# Patient Record
Sex: Female | Born: 1945
Health system: Southern US, Community
[De-identification: ages and names within clinical notes are randomized; demographics above are authoritative.]

## PROBLEM LIST (undated history)

## (undated) DIAGNOSIS — G8190 Hemiplegia, unspecified affecting unspecified side: Secondary | ICD-10-CM

## (undated) DIAGNOSIS — F32A Depression, unspecified: Secondary | ICD-10-CM

## (undated) DIAGNOSIS — I10 Essential (primary) hypertension: Secondary | ICD-10-CM

## (undated) DIAGNOSIS — F0390 Unspecified dementia without behavioral disturbance: Secondary | ICD-10-CM

## (undated) DIAGNOSIS — M1990 Unspecified osteoarthritis, unspecified site: Secondary | ICD-10-CM

## (undated) DIAGNOSIS — I639 Cerebral infarction, unspecified: Secondary | ICD-10-CM

## (undated) DIAGNOSIS — I729 Aneurysm of unspecified site: Secondary | ICD-10-CM

## (undated) DIAGNOSIS — F329 Major depressive disorder, single episode, unspecified: Secondary | ICD-10-CM

## (undated) HISTORY — PX: TOTAL KNEE ARTHROPLASTY: SHX125

## (undated) SURGERY — Surgical Case
Anesthesia: *Unknown

---

## 1998-06-10 ENCOUNTER — Other Ambulatory Visit: Admission: RE | Admit: 1998-06-10 | Discharge: 1998-06-10 | Payer: Self-pay | Admitting: Obstetrics & Gynecology

## 1998-06-10 ENCOUNTER — Encounter: Admission: RE | Admit: 1998-06-10 | Discharge: 1998-06-10 | Payer: Self-pay | Admitting: Obstetrics & Gynecology

## 1998-09-04 ENCOUNTER — Encounter: Payer: Self-pay | Admitting: Emergency Medicine

## 1998-09-04 ENCOUNTER — Emergency Department (HOSPITAL_COMMUNITY): Admission: EM | Admit: 1998-09-04 | Discharge: 1998-09-04 | Payer: Self-pay | Admitting: Emergency Medicine

## 1999-02-03 ENCOUNTER — Emergency Department (HOSPITAL_COMMUNITY): Admission: EM | Admit: 1999-02-03 | Discharge: 1999-02-03 | Payer: Self-pay | Admitting: Emergency Medicine

## 1999-04-09 ENCOUNTER — Encounter: Admission: RE | Admit: 1999-04-09 | Discharge: 1999-04-09 | Payer: Self-pay | Admitting: Obstetrics

## 1999-04-09 ENCOUNTER — Other Ambulatory Visit: Admission: RE | Admit: 1999-04-09 | Discharge: 1999-04-09 | Payer: Self-pay | Admitting: Obstetrics

## 1999-04-22 ENCOUNTER — Ambulatory Visit (HOSPITAL_COMMUNITY): Admission: RE | Admit: 1999-04-22 | Discharge: 1999-04-22 | Payer: Self-pay | Admitting: Obstetrics

## 1999-04-30 ENCOUNTER — Ambulatory Visit (HOSPITAL_COMMUNITY): Admission: RE | Admit: 1999-04-30 | Discharge: 1999-04-30 | Payer: Self-pay | Admitting: Obstetrics

## 1999-06-25 ENCOUNTER — Emergency Department (HOSPITAL_COMMUNITY): Admission: EM | Admit: 1999-06-25 | Discharge: 1999-06-25 | Payer: Self-pay | Admitting: Emergency Medicine

## 1999-09-05 ENCOUNTER — Emergency Department (HOSPITAL_COMMUNITY): Admission: EM | Admit: 1999-09-05 | Discharge: 1999-09-05 | Payer: Self-pay | Admitting: Emergency Medicine

## 1999-09-26 ENCOUNTER — Emergency Department (HOSPITAL_COMMUNITY): Admission: EM | Admit: 1999-09-26 | Discharge: 1999-09-26 | Payer: Self-pay | Admitting: Emergency Medicine

## 2000-01-15 ENCOUNTER — Encounter: Admission: RE | Admit: 2000-01-15 | Discharge: 2000-01-15 | Payer: Self-pay | Admitting: Obstetrics & Gynecology

## 2000-01-15 ENCOUNTER — Encounter: Admission: RE | Admit: 2000-01-15 | Discharge: 2000-01-15 | Payer: Self-pay | Admitting: Internal Medicine

## 2000-02-01 ENCOUNTER — Ambulatory Visit (HOSPITAL_COMMUNITY): Admission: RE | Admit: 2000-02-01 | Discharge: 2000-02-01 | Payer: Self-pay

## 2000-02-24 ENCOUNTER — Encounter: Admission: RE | Admit: 2000-02-24 | Discharge: 2000-02-24 | Payer: Self-pay | Admitting: Internal Medicine

## 2000-03-07 ENCOUNTER — Emergency Department (HOSPITAL_COMMUNITY): Admission: EM | Admit: 2000-03-07 | Discharge: 2000-03-07 | Payer: Self-pay | Admitting: Emergency Medicine

## 2000-06-09 ENCOUNTER — Encounter: Admission: RE | Admit: 2000-06-09 | Discharge: 2000-06-09 | Payer: Self-pay | Admitting: Obstetrics

## 2000-06-13 ENCOUNTER — Encounter: Admission: RE | Admit: 2000-06-13 | Discharge: 2000-06-13 | Payer: Self-pay | Admitting: Internal Medicine

## 2000-07-26 ENCOUNTER — Encounter: Admission: RE | Admit: 2000-07-26 | Discharge: 2000-07-26 | Payer: Self-pay | Admitting: Obstetrics & Gynecology

## 2000-08-26 ENCOUNTER — Encounter: Admission: RE | Admit: 2000-08-26 | Discharge: 2000-08-26 | Payer: Self-pay | Admitting: Internal Medicine

## 2000-09-13 ENCOUNTER — Emergency Department (HOSPITAL_COMMUNITY): Admission: EM | Admit: 2000-09-13 | Discharge: 2000-09-13 | Payer: Self-pay | Admitting: Emergency Medicine

## 2000-12-29 ENCOUNTER — Inpatient Hospital Stay (HOSPITAL_COMMUNITY): Admission: EM | Admit: 2000-12-29 | Discharge: 2001-02-09 | Payer: Self-pay | Admitting: Emergency Medicine

## 2000-12-29 ENCOUNTER — Encounter: Payer: Self-pay | Admitting: Emergency Medicine

## 2000-12-30 ENCOUNTER — Encounter: Payer: Self-pay | Admitting: Neurological Surgery

## 2000-12-31 ENCOUNTER — Encounter: Payer: Self-pay | Admitting: Neurosurgery

## 2001-01-01 ENCOUNTER — Encounter: Payer: Self-pay | Admitting: Pulmonary Disease

## 2001-01-02 ENCOUNTER — Encounter: Payer: Self-pay | Admitting: Critical Care Medicine

## 2001-01-02 ENCOUNTER — Encounter: Payer: Self-pay | Admitting: Pulmonary Disease

## 2001-01-02 ENCOUNTER — Encounter: Payer: Self-pay | Admitting: Neurological Surgery

## 2001-01-03 ENCOUNTER — Encounter: Payer: Self-pay | Admitting: Neurological Surgery

## 2001-01-04 ENCOUNTER — Encounter: Payer: Self-pay | Admitting: Critical Care Medicine

## 2001-01-05 ENCOUNTER — Encounter: Payer: Self-pay | Admitting: Nephrology

## 2001-01-06 ENCOUNTER — Encounter: Payer: Self-pay | Admitting: Pulmonary Disease

## 2001-01-06 ENCOUNTER — Encounter: Payer: Self-pay | Admitting: Neurosurgery

## 2001-01-07 ENCOUNTER — Encounter: Payer: Self-pay | Admitting: Critical Care Medicine

## 2001-01-08 ENCOUNTER — Encounter: Payer: Self-pay | Admitting: General Surgery

## 2001-01-08 ENCOUNTER — Encounter: Payer: Self-pay | Admitting: Neurological Surgery

## 2001-01-09 ENCOUNTER — Encounter: Payer: Self-pay | Admitting: Critical Care Medicine

## 2001-01-09 ENCOUNTER — Encounter: Payer: Self-pay | Admitting: Neurological Surgery

## 2001-01-10 ENCOUNTER — Encounter: Payer: Self-pay | Admitting: Neurological Surgery

## 2001-01-11 ENCOUNTER — Encounter: Payer: Self-pay | Admitting: Internal Medicine

## 2001-01-11 ENCOUNTER — Encounter: Payer: Self-pay | Admitting: Neurological Surgery

## 2001-01-12 ENCOUNTER — Encounter: Payer: Self-pay | Admitting: Internal Medicine

## 2001-01-13 ENCOUNTER — Encounter: Payer: Self-pay | Admitting: Internal Medicine

## 2001-01-14 ENCOUNTER — Encounter: Payer: Self-pay | Admitting: Internal Medicine

## 2001-01-16 ENCOUNTER — Encounter: Payer: Self-pay | Admitting: Neurological Surgery

## 2001-01-18 ENCOUNTER — Encounter: Payer: Self-pay | Admitting: Internal Medicine

## 2001-01-23 ENCOUNTER — Encounter: Payer: Self-pay | Admitting: Neurological Surgery

## 2001-01-30 ENCOUNTER — Encounter: Payer: Self-pay | Admitting: Neurological Surgery

## 2001-01-31 ENCOUNTER — Encounter: Payer: Self-pay | Admitting: Neurological Surgery

## 2001-02-07 ENCOUNTER — Encounter: Payer: Self-pay | Admitting: Neurological Surgery

## 2001-04-06 ENCOUNTER — Encounter: Admission: RE | Admit: 2001-04-06 | Discharge: 2001-04-06 | Payer: Self-pay | Admitting: Internal Medicine

## 2001-04-27 ENCOUNTER — Encounter: Admission: RE | Admit: 2001-04-27 | Discharge: 2001-04-27 | Payer: Self-pay | Admitting: Internal Medicine

## 2001-05-20 ENCOUNTER — Emergency Department (HOSPITAL_COMMUNITY): Admission: EM | Admit: 2001-05-20 | Discharge: 2001-05-20 | Payer: Self-pay | Admitting: Emergency Medicine

## 2001-05-20 ENCOUNTER — Encounter: Payer: Self-pay | Admitting: Emergency Medicine

## 2001-05-21 ENCOUNTER — Emergency Department (HOSPITAL_COMMUNITY): Admission: EM | Admit: 2001-05-21 | Discharge: 2001-05-21 | Payer: Self-pay

## 2001-09-26 ENCOUNTER — Encounter: Admission: RE | Admit: 2001-09-26 | Discharge: 2001-11-22 | Payer: Self-pay | Admitting: Neurological Surgery

## 2001-10-24 ENCOUNTER — Emergency Department (HOSPITAL_COMMUNITY): Admission: EM | Admit: 2001-10-24 | Discharge: 2001-10-24 | Payer: Self-pay | Admitting: Emergency Medicine

## 2002-01-27 ENCOUNTER — Ambulatory Visit (HOSPITAL_COMMUNITY): Admission: RE | Admit: 2002-01-27 | Discharge: 2002-01-27 | Payer: Self-pay | Admitting: Family Medicine

## 2002-01-27 ENCOUNTER — Encounter: Payer: Self-pay | Admitting: Family Medicine

## 2002-02-05 ENCOUNTER — Encounter: Payer: Self-pay | Admitting: Dentistry

## 2002-02-06 ENCOUNTER — Ambulatory Visit (HOSPITAL_COMMUNITY): Admission: RE | Admit: 2002-02-06 | Discharge: 2002-02-06 | Payer: Self-pay | Admitting: Dentistry

## 2002-11-07 ENCOUNTER — Emergency Department (HOSPITAL_COMMUNITY): Admission: EM | Admit: 2002-11-07 | Discharge: 2002-11-07 | Payer: Self-pay | Admitting: Emergency Medicine

## 2002-11-07 ENCOUNTER — Encounter: Payer: Self-pay | Admitting: Emergency Medicine

## 2002-12-30 ENCOUNTER — Encounter: Payer: Self-pay | Admitting: Emergency Medicine

## 2002-12-30 ENCOUNTER — Emergency Department (HOSPITAL_COMMUNITY): Admission: EM | Admit: 2002-12-30 | Discharge: 2002-12-30 | Payer: Self-pay | Admitting: Emergency Medicine

## 2003-04-15 ENCOUNTER — Other Ambulatory Visit: Admission: RE | Admit: 2003-04-15 | Discharge: 2003-04-15 | Payer: Self-pay | Admitting: Family Medicine

## 2003-05-16 ENCOUNTER — Ambulatory Visit (HOSPITAL_COMMUNITY): Admission: RE | Admit: 2003-05-16 | Discharge: 2003-05-16 | Payer: Self-pay | Admitting: Family Medicine

## 2004-04-28 ENCOUNTER — Other Ambulatory Visit: Admission: RE | Admit: 2004-04-28 | Discharge: 2004-04-28 | Payer: Self-pay | Admitting: Family Medicine

## 2004-05-20 ENCOUNTER — Ambulatory Visit (HOSPITAL_COMMUNITY): Admission: RE | Admit: 2004-05-20 | Discharge: 2004-05-20 | Payer: Self-pay | Admitting: Family Medicine

## 2004-08-26 ENCOUNTER — Ambulatory Visit: Payer: Self-pay | Admitting: Family Medicine

## 2004-09-02 ENCOUNTER — Ambulatory Visit (HOSPITAL_COMMUNITY): Admission: RE | Admit: 2004-09-02 | Discharge: 2004-09-02 | Payer: Self-pay | Admitting: Family Medicine

## 2004-11-26 ENCOUNTER — Ambulatory Visit: Payer: Self-pay | Admitting: Family Medicine

## 2005-02-26 ENCOUNTER — Emergency Department (HOSPITAL_COMMUNITY): Admission: EM | Admit: 2005-02-26 | Discharge: 2005-02-26 | Payer: Self-pay | Admitting: Family Medicine

## 2005-02-26 ENCOUNTER — Ambulatory Visit (HOSPITAL_COMMUNITY): Admission: RE | Admit: 2005-02-26 | Discharge: 2005-02-26 | Payer: Self-pay | Admitting: Family Medicine

## 2005-05-06 ENCOUNTER — Ambulatory Visit: Payer: Self-pay | Admitting: Family Medicine

## 2005-05-24 ENCOUNTER — Ambulatory Visit (HOSPITAL_COMMUNITY): Admission: RE | Admit: 2005-05-24 | Discharge: 2005-05-24 | Payer: Self-pay | Admitting: Family Medicine

## 2005-05-27 ENCOUNTER — Ambulatory Visit: Payer: Self-pay | Admitting: Family Medicine

## 2005-08-12 ENCOUNTER — Emergency Department (HOSPITAL_COMMUNITY): Admission: EM | Admit: 2005-08-12 | Discharge: 2005-08-12 | Payer: Self-pay | Admitting: Emergency Medicine

## 2006-05-26 ENCOUNTER — Ambulatory Visit (HOSPITAL_COMMUNITY): Admission: RE | Admit: 2006-05-26 | Discharge: 2006-05-26 | Payer: Self-pay | Admitting: Family Medicine

## 2008-06-03 ENCOUNTER — Ambulatory Visit (HOSPITAL_COMMUNITY): Admission: RE | Admit: 2008-06-03 | Discharge: 2008-06-03 | Payer: Self-pay | Admitting: *Deleted

## 2009-06-03 ENCOUNTER — Other Ambulatory Visit: Admission: RE | Admit: 2009-06-03 | Discharge: 2009-06-03 | Payer: Self-pay | Admitting: Family Medicine

## 2009-06-04 ENCOUNTER — Ambulatory Visit (HOSPITAL_COMMUNITY): Admission: RE | Admit: 2009-06-04 | Discharge: 2009-06-04 | Payer: Self-pay | Admitting: Family Medicine

## 2009-11-08 DIAGNOSIS — I639 Cerebral infarction, unspecified: Secondary | ICD-10-CM

## 2009-11-08 HISTORY — PX: BRAIN SURGERY: SHX531

## 2009-11-08 HISTORY — DX: Cerebral infarction, unspecified: I63.9

## 2010-04-30 ENCOUNTER — Inpatient Hospital Stay (HOSPITAL_COMMUNITY): Admission: RE | Admit: 2010-04-30 | Discharge: 2010-05-04 | Payer: Self-pay | Admitting: Orthopedic Surgery

## 2010-06-06 ENCOUNTER — Emergency Department (HOSPITAL_COMMUNITY): Admission: EM | Admit: 2010-06-06 | Discharge: 2010-06-06 | Payer: Self-pay | Admitting: Emergency Medicine

## 2010-10-21 ENCOUNTER — Ambulatory Visit (HOSPITAL_COMMUNITY)
Admission: RE | Admit: 2010-10-21 | Discharge: 2010-10-21 | Payer: Self-pay | Source: Home / Self Care | Attending: Family Medicine | Admitting: Family Medicine

## 2010-11-28 ENCOUNTER — Encounter: Payer: Self-pay | Admitting: Family Medicine

## 2010-11-29 ENCOUNTER — Encounter: Payer: Self-pay | Admitting: Family Medicine

## 2011-01-23 LAB — URINE MICROSCOPIC-ADD ON

## 2011-01-23 LAB — URINALYSIS, ROUTINE W REFLEX MICROSCOPIC
Bilirubin Urine: NEGATIVE
Glucose, UA: NEGATIVE mg/dL
Ketones, ur: NEGATIVE mg/dL
Nitrite: NEGATIVE
Protein, ur: 100 mg/dL — AB
Specific Gravity, Urine: 1.015 (ref 1.005–1.030)
Urobilinogen, UA: 0.2 mg/dL (ref 0.0–1.0)
pH: 6.5 (ref 5.0–8.0)

## 2011-01-23 LAB — PROTIME-INR
INR: 1.65 — ABNORMAL HIGH (ref 0.00–1.49)
Prothrombin Time: 19.4 seconds — ABNORMAL HIGH (ref 11.6–15.2)

## 2011-01-23 LAB — CBC
HCT: 31.6 % — ABNORMAL LOW (ref 36.0–46.0)
Hemoglobin: 10.8 g/dL — ABNORMAL LOW (ref 12.0–15.0)
MCH: 31.7 pg (ref 26.0–34.0)
MCHC: 34 g/dL (ref 30.0–36.0)
MCV: 93.3 fL (ref 78.0–100.0)
Platelets: 277 10*3/uL (ref 150–400)
RBC: 3.39 MIL/uL — ABNORMAL LOW (ref 3.87–5.11)
RDW: 15.4 % (ref 11.5–15.5)
WBC: 7.8 10*3/uL (ref 4.0–10.5)

## 2011-01-23 LAB — URINE CULTURE: Colony Count: 9000

## 2011-01-24 LAB — PROTIME-INR
INR: 1.07 (ref 0.00–1.49)
INR: 2.28 — ABNORMAL HIGH (ref 0.00–1.49)
Prothrombin Time: 13.8 seconds (ref 11.6–15.2)
Prothrombin Time: 15.3 seconds — ABNORMAL HIGH (ref 11.6–15.2)
Prothrombin Time: 24.9 seconds — ABNORMAL HIGH (ref 11.6–15.2)

## 2011-01-24 LAB — COMPREHENSIVE METABOLIC PANEL
ALT: 11 U/L (ref 0–35)
AST: 16 U/L (ref 0–37)
Albumin: 3.9 g/dL (ref 3.5–5.2)
Alkaline Phosphatase: 81 U/L (ref 39–117)
BUN: 11 mg/dL (ref 6–23)
CO2: 31 mEq/L (ref 19–32)
Calcium: 10.4 mg/dL (ref 8.4–10.5)
Chloride: 111 mEq/L (ref 96–112)
Creatinine, Ser: 0.96 mg/dL (ref 0.4–1.2)
GFR calc Af Amer: >60 mL/min (ref >60)
GFR calc non Af Amer: 59 mL/min — ABNORMAL LOW (ref >60)
Glucose, Bld: 81 mg/dL (ref 70–99)
Potassium: 6.2 mEq/L — ABNORMAL HIGH (ref 3.5–5.1)
Sodium: 146 mEq/L — ABNORMAL HIGH (ref 135–145)
Total Bilirubin: 0.3 mg/dL (ref 0.3–1.2)
Total Protein: 7.1 g/dL (ref 6.0–8.3)

## 2011-01-24 LAB — BASIC METABOLIC PANEL
BUN: 5 mg/dL — ABNORMAL LOW (ref 6–23)
BUN: 5 mg/dL — ABNORMAL LOW (ref 6–23)
BUN: 7 mg/dL (ref 6–23)
CO2: 28 mEq/L (ref 19–32)
CO2: 29 mEq/L (ref 19–32)
Calcium: 8.8 mg/dL (ref 8.4–10.5)
Calcium: 9.5 mg/dL (ref 8.4–10.5)
Chloride: 101 mEq/L (ref 96–112)
Chloride: 109 mEq/L (ref 96–112)
Creatinine, Ser: 0.76 mg/dL (ref 0.4–1.2)
Creatinine, Ser: 0.79 mg/dL (ref 0.4–1.2)
Creatinine, Ser: 0.96 mg/dL (ref 0.4–1.2)
GFR calc Af Amer: >60 mL/min (ref >60)
GFR calc Af Amer: >60 mL/min (ref >60)
GFR calc non Af Amer: 59 mL/min — ABNORMAL LOW (ref >60)
GFR calc non Af Amer: >60 mL/min (ref >60)
GFR calc non Af Amer: >60 mL/min (ref >60)
Glucose, Bld: 130 mg/dL — ABNORMAL HIGH (ref 70–99)
Glucose, Bld: 138 mg/dL — ABNORMAL HIGH (ref 70–99)
Glucose, Bld: 91 mg/dL (ref 70–99)
Potassium: 3 mEq/L — ABNORMAL LOW (ref 3.5–5.1)
Potassium: 3.9 mEq/L (ref 3.5–5.1)
Potassium: 4.4 mEq/L (ref 3.5–5.1)
Sodium: 140 mEq/L (ref 135–145)
Sodium: 142 mEq/L (ref 135–145)

## 2011-01-24 LAB — CBC
HCT: 31.4 % — ABNORMAL LOW (ref 36.0–46.0)
HCT: 37.9 % (ref 36.0–46.0)
Hemoglobin: 10.9 g/dL — ABNORMAL LOW (ref 12.0–15.0)
Hemoglobin: 12.8 g/dL (ref 12.0–15.0)
MCH: 32.2 pg (ref 26.0–34.0)
MCHC: 33.8 g/dL (ref 30.0–36.0)
MCHC: 34.3 g/dL (ref 30.0–36.0)
MCHC: 34.9 g/dL (ref 30.0–36.0)
MCV: 94.1 fL (ref 78.0–100.0)
MCV: 94.8 fL (ref 78.0–100.0)
Platelets: 208 10*3/uL (ref 150–400)
Platelets: 303 10*3/uL (ref 150–400)
RBC: 4 MIL/uL (ref 3.87–5.11)
RDW: 15.1 % (ref 11.5–15.5)
RDW: 15.3 % (ref 11.5–15.5)
RDW: 15.9 % — ABNORMAL HIGH (ref 11.5–15.5)
WBC: 8.2 10*3/uL (ref 4.0–10.5)

## 2011-01-24 LAB — TYPE AND SCREEN
ABO/RH(D): O POS
Antibody Screen: NEGATIVE

## 2011-01-24 LAB — SURGICAL PCR SCREEN
MRSA, PCR: NEGATIVE
Staphylococcus aureus: NEGATIVE

## 2011-01-24 LAB — APTT: aPTT: 35 seconds (ref 24–37)

## 2011-01-24 LAB — ABO/RH: ABO/RH(D): O POS

## 2011-03-26 NOTE — Op Note (Signed)
[place]. [facility]  Patient:    YURIDIANA, FORMANEK Visit Number: [id number] MRN: [id number]          Service Type: EMS Location: [facility] [place] Attending Physician:  Ilene Qua Dictated by:   Cherly Anderson, D.D.S. Proc. Date: 02/06/02 Admit Date:  10/24/2001 Discharge Date: 10/24/2001                             Operative Report  PREOPERATIVE DIAGNOSIS:  All remaining teeth decayed/necrotic/nonrestorable.  POSTOPERATIVE DIAGNOSIS:  All remaining teeth decayed/necrotic/nonrestorable.  PROCEDURE:  Surgical extraction of teeth #22,23,24,25,27,28, root recovery of teeth #6,7,8,10,20,21,26,29, alveoloplasty with extractions right maxilla, right mandible, left mandible.  SURGEON:  Cherly Anderson, D.D.S.  ANESTHESIA:  General anesthesia.  INDICATIONS FOR PROCEDURE:  Ms. Hofland was evaluated in our office upon referral from her general dentist for removal of remaining teeth.  Clinical and radiographic examination shows that all remaining teeth are either decayed, necrotic, or nonrestorable.  Concerning the patients difficulty in cooperating I did not feel it could be performed under safe conditions in the office.  Therefore, she was taken to the main operating room at [facility] for removal of all remaining teeth under general anesthesia in the main OR.  DESCRIPTION OF PROCEDURE:  The patient was brought to the operating room and placed in the supine position.  Once the general anesthesia was induced via nasotracheal intubation, the patient was prepped and draped for maxillofacial procedure of this type.  Initially, local anesthesia was injected in all four quadrants of the mouth totalling 6 cc of 0.5% Marcaine without epinephrine. Next, the #15 blade was utilized to make an incision through the gingiva around all remaining teeth and the maxilla and mandible.  The mucoperiosteal flap was reflected around all remaining teeth.  All remaining teeth  were loosened and removed without complications.  The following teeth were removed surgically: Teeth #22,23,24,25,27, and 28.  The following teeth had root recovery: Teeth #6,7,8,10,20,21,26, and 29.  Next, an alveoloplasty bur was utilized to contour the alveolus where the teeth were extracted.  This was performed in the right maxilla, right mandible, and left mandible.  Once the bone was adequately contoured, the ridges were palpated and found to have no sharp, bony projections or significant undercuts which would make it difficult for prosthesis fabrication.  It should be noted that during the alveoloplasty procedure, copious amounts of normal saline irrigation was utilized.  All surgical sites were irrigated thoroughly with normal saline.  The surgical sites were closed utilizing 3-0 Chromic suture in a running fashion.  The patient was allowed to awaken in the operating room and extubated without complications.  She was then transferred to the PACU in stable and satisfactory condition. Dictated by:   Cherly Anderson, D.D.S. Attending Physician:  Ilene Qua DD:  02/06/02 TD:  02/06/02 Job: [id number]

## 2011-03-26 NOTE — H&P (Signed)
[place]. [facility]  Patient:    Jamie Haas, Jamie Haas                      MRN: [id number] Adm. Date:  [id number] Attending:  Jonne Ply                         History and Physical  ADMITTING DIAGNOSIS:  Subarachnoid hemorrhage.  HISTORY OF PRESENT ILLNESS:  Patient is a 65 year old left-handed black female who has a history of smoking two to three packs of cigarettes for many years, a history of some alcohol abuse with excessive beer drinking daily, and some chronic depression.  She developed the sudden and severe onset of headache at about 4:30 p.m.  She was brought by her daughter to the [facility] emergency room where a CT scan of the brain shows a subarachnoid hemorrhage.  She is now to be admitted to undergo further workup.  She is being arranged to undergo emergent angiography at the time of this writing.  PAST MEDICAL HISTORY:  Significant for depression and alcohol abuse.  She has had no previous surgery.  MEDICATIONS:  She uses no medication on a chronic basis.  HABITS:  Her daughter does note that she does smoke marijuana occasionally.  ALLERGIES:  No known drug allergies.  FAMILY HISTORY:  Unknown in that there is no significant history that is know to the patient and her family.  SOCIAL HISTORY:  Reveals that she is unemployed.  She lives with her daughter. Has a history of significant alcohol usage.  REVIEW OF SYSTEMS:  Negative for a 14 point systems review other than complaints of chronic shortness of breath and recurrent bronchitis.  PHYSICAL EXAMINATION:  VITAL SIGNS:  Blood pressure 140/86, heart rate 72, respirations 14, temperature 97.3.  GENERAL:  She alerts to voice though she is extremely lethargic.  She complains of headache and vomits.  She moves all four extremities quite well. There is no evidence of a plegia.  HEENT:  Her pupils are 4 mm, briskly reactive to light and accommodation. Extraocular muscles are  full.  Face is symmetric to grimace.  Tongue and uvula are in the midline.  Sclerae and conjunctivae were clear.  Fundi reveal the disks to be flat but no evidence of venous pulsation.  There is no evidence of subhyaloid hemorrhage.  NECK:  Reveals no masses and no bruits are heard.  LUNGS:  Clear to auscultation.  HEART:  Regular rate and rhythm.  ABDOMEN:  Soft, bowel sounds present.  No masses are noted.  EXTREMITIES:  Reveal no cyanosis, clubbing, or edema.  IMPRESSION:  Patient has evidence of a subarachnoid hemorrhage.  She has a grade 3 Hunt and Hess.  She is to be admitted to undergo angiography, possible coiling if felt to be appropriate by Dr. Corliss Skains.  Otherwise, surgical intervention will be planned as needed. DD:  12/29/00 TD:  12/30/00 Job: [id number]

## 2011-03-26 NOTE — Op Note (Signed)
[place]. [facility]  Patient:    Jamie Haas, Jamie Haas                      MRN: [id number] Proc. Date: 12/29/00 Adm. Date:  [id number] Attending:  Jonne Ply                           Operative Report  PREOPERATIVE DIAGNOSIS:  Right posterior communicating artery aneurysm, subarachnoid hemorrhage.  POSTOPERATIVE DIAGNOSIS:  Right posterior communicating artery aneurysm, subarachnoid hemorrhage.  PROCEDURE:  Insertion of right intraventricular drainage catheter.  SURGEON:  Stefani Dama, M.D.  ANESTHESIA:  Local anesthetic - 6 cc with 1 cc of Versed intravenously. Lidocaine 2% was used subcutaneously without epinephrine.  INDICATIONS:  The patient had a subarachnoid hemorrhage yesterday.  She was found to have significant hydrocephalus and the family was advised regarding placement of an IVC.  DESCRIPTION OF PROCEDURE:  The patient had the right frontal scalp prepped with Betadine scrub and solution and then a linear incision was made after infiltrating 5 cc of 2% lidocaine solution in the right frontal area.  A bur hole was created, dura was identified and opened in a cruciate fashion with a 15-blade.  A ventriculostomy catheter was inserted to 5 cm depth and bloody spinal fluid was obtained at a pressure greater than 30 cmH20.  The catheter was tunneled subcutaneously.  The wound was closed with 3-0 nylon and a 3-0 silk was used to secure the catheter.  System was connected to drain open at 10 cmH20. DD:  12/30/00 TD:  12/30/00 Job: [id number]

## 2011-03-26 NOTE — Op Note (Signed)
[place]. [facility]  Patient:    Jamie Haas, Jamie Haas                      MRN: [id number] Proc. Date: 01/12/01 Adm. Date:  [id number] Attending:  Jonne Ply                           Operative Report  PREOPERATIVE DIAGNOSIS:  Chronic ventilatory dependency.  POSTOPERATIVE DIAGNOSIS:  Chronic ventilatory dependency.  OPERATION PERFORMED:  Tracheotomy.  SURGEON:  Lucky Cowboy, M.D.  ANESTHESIA:  General endotracheal.  ESTIMATED BLOOD LOSS:  Minimal.  COMPLICATIONS:  None.  INDICATIONS FOR PROCEDURE:  The patient is a 65 year old female who was admitted December 29, 2000 for severe subarachnoid hemorrhage.  Since that time, she has suffered from adult respiratory distress syndrome.  She has required constant ventilatory support.  For this reason, tracheotomy was plced.  FINDINGS:  The patient was noted to have normal tracheal anatomy.  Tracheotomy tube was placed between rings 2 and 3.  One knot was placed around the stay suture around ring #3.  A #6 cuffed shiley was placed.  DESCRIPTION OF PROCEDURE:  The patient was taken to the operating room and placed on the table in a supine position.  She was already intubated and was placed under general anesthesia.  The neck was then gently extended with a shoulder roll.  It was then prepped with Betadine and draped in the usual sterile fashion.  A transverse 2 cm incision was made in the lower neck overlying the trachea.  It was made with a #15 blade and the subcutaneous tissues divided using Bovie cautery.  The strap muscles were divided in the median raphe.  The thyroid was reflected inferiorly.  The thyroid isthmus was not divided.  A 15 blade was used to make an incision between rings 2 and 3. Stay suture using 0 silk was placed around the third tracheal ring and one knot placed.  A #6 Shiley tube was then placed intratracheally with CO2 returning, good breath sounds bilaterally.  Cuff was  inflated.  The flanges of the tracheotomy tube were then secured to the skin in simple interrupted fashion using 0 silk.  The Velcro tie was then applied.  The patient was then returned to the intensive care unit in stable condition.  There were no complications. DD:  01/12/01 TD:  01/13/01 Job: [id number]

## 2011-03-26 NOTE — Consult Note (Signed)
[place]. [facility]  Patient:    Jamie Haas, Jamie Haas                      MRN: [id number] Proc. Date: 12/29/00 Adm. Date:  [id number] Attending:  Jonne Ply                          Consultation Report  REQUESTING PHYSICIAN:  Dr. Trudi Ida. Denton Lank, M.D.  REASON FOR CONSULTATION:  Subarachnoid hemorrhage.  HISTORY OF PRESENT ILLNESS:  The patient is a 65 year old individual who sustained a subarachnoid hemorrhage today.  She was brought to the emergency room complaining of significant headache, neck stiffness, nausea and vomiting. CT scan revealed a subarachnoid hemorrhage and clinically she is a grade 3. She will be admitted to undergo emergent angiography.  For other details of the past medical history, social history, systems review, current medication usage and other details please refer to the history and physical examination dictated under separate cover. DD:  12/29/00 TD:  12/31/00 Job: [id number]

## 2011-03-26 NOTE — Discharge Summary (Signed)
[place]. [facility]  Patient:    Jamie Haas, Jamie Haas                      MRN: [id number] Adm. Date:  [id number] Attending:  Jonne Ply                           Discharge Summary  ADMISSION DIAGNOSES: 1. Subarachnoid hemorrhage secondary to right posterior communicating artery    aneurysm. 2. Acute hydrocephalus secondary to subarachnoid hemorrhage.  DISCHARGE DIAGNOSES: 1. Subarachnoid hemorrhage secondary to ruptured aneurysm. 2. Hydrocephalus, now resolved. 3. Depression. 4. History of substance abuse. 5. Left hemiplegia.  PROCEDURES: 1. Endovascular coiling with Guglielmi coils of right posterior communicating    artery aneurysm, right frontal intraventricular catheter placement. 2. Tracheostomy done January 12, 2001. 3. Decannulated March 27.  ADMISSION HISTORY:  The patient is a 65 year old woman who was admitted emergently secondary to a subarachnoid hemorrhage on December 29, 2000.  She had evidence of acute hydrocephalus.  She had not had any previous surgery. she was extremely lethargic initially and a grade 3 Hunt and Hess scale patient.  She underwent coiling on December 30, 2000, and had placement of a right frontal intraventricular catheter.  She was taken to the intensive care unit and did quite poorly for a number of days.  She developed vasospasm and cerebral infarcts.  She was dependent upon a ventilatory and therefore underwent a tracheostomy.  She had multiple bouts of pneumonia and fevers throughout her hospitalization.  She eventually rallied and made improvements. The ventricular catheter was able to be removed, the tracheostomy was able to be removed.  She regained good cognitive function, but remained plegic on her left side secondary to the vasospasm and infarcts.  At the time of discharge, she is alert, follows commands, oriented x 4, answering all questions, and is cooperative.  She is tolerating a soft diet, which at  our institution is considered the dysphagia I diet which consists of pudding-thick foods.  She also has been receiving physical therapy, occupational therapy, and speech therapy during her hospitalization.  Tracheostomy incision is clean and dry and without signs of infection.  She was also treated for pansinusitis during her hospitalization.  CURRENT MEDICATIONS:  Septra, Prozac, morphine for pain, Tylenol, Imodium, albuterol for breathing treatments, and Ensure pudding.  DISPOSITION:  The patient will be discharged to the nursing home.  CONDITION AT DISCHARGE:  Her status currently is improved.  DISCHARGE FOLLOWUP:  She will have a follow-up with Dr. Danielle Dess approximately one month after discharge from the nursing home.  She is to receive physical therapy, occupational therapy, and continued speech therapy. DD:  02/07/01 TD:  02/07/01 Job: [id number]

## 2011-09-15 ENCOUNTER — Other Ambulatory Visit (HOSPITAL_COMMUNITY): Payer: Self-pay | Admitting: Family Medicine

## 2011-09-15 DIAGNOSIS — Z1231 Encounter for screening mammogram for malignant neoplasm of breast: Secondary | ICD-10-CM

## 2011-10-25 ENCOUNTER — Ambulatory Visit (HOSPITAL_COMMUNITY)
Admission: RE | Admit: 2011-10-25 | Discharge: 2011-10-25 | Disposition: A | Discharge status: Home / Self Care | Payer: PRIVATE HEALTH INSURANCE | Source: Ambulatory Visit | Attending: Family Medicine | Admitting: Family Medicine

## 2011-10-25 ENCOUNTER — Ambulatory Visit (HOSPITAL_COMMUNITY): Payer: Self-pay

## 2011-10-25 DIAGNOSIS — Z1231 Encounter for screening mammogram for malignant neoplasm of breast: Secondary | ICD-10-CM

## 2011-11-22 ENCOUNTER — Emergency Department (HOSPITAL_COMMUNITY)
Admission: EM | Admit: 2011-11-22 | Discharge: 2011-11-22 | Disposition: A | Discharge status: Home / Self Care | Payer: PRIVATE HEALTH INSURANCE | Attending: Emergency Medicine | Admitting: Emergency Medicine

## 2011-11-22 ENCOUNTER — Encounter (HOSPITAL_COMMUNITY): Payer: Self-pay | Admitting: Emergency Medicine

## 2011-11-22 DIAGNOSIS — J3489 Other specified disorders of nose and nasal sinuses: Secondary | ICD-10-CM | POA: Insufficient documentation

## 2011-11-22 DIAGNOSIS — R51 Headache: Secondary | ICD-10-CM | POA: Insufficient documentation

## 2011-11-22 DIAGNOSIS — J019 Acute sinusitis, unspecified: Secondary | ICD-10-CM

## 2011-11-22 DIAGNOSIS — I10 Essential (primary) hypertension: Secondary | ICD-10-CM | POA: Insufficient documentation

## 2011-11-22 DIAGNOSIS — H9209 Otalgia, unspecified ear: Secondary | ICD-10-CM | POA: Insufficient documentation

## 2011-11-22 DIAGNOSIS — F17200 Nicotine dependence, unspecified, uncomplicated: Secondary | ICD-10-CM | POA: Insufficient documentation

## 2011-11-22 HISTORY — DX: Aneurysm of unspecified site: I72.9

## 2011-11-22 HISTORY — DX: Essential (primary) hypertension: I10

## 2011-11-22 MED ORDER — ANTIPYRINE-BENZOCAINE 5.4-1.4 % OT SOLN
3.0000 [drp] | Freq: Once | OTIC | Status: AC
  Administered 2011-11-22: 3 [drp] via OTIC
  Filled 2011-11-22: qty 10

## 2011-11-22 MED ORDER — IBUPROFEN 200 MG PO TABS
400.0000 mg | ORAL_TABLET | Freq: Once | ORAL | Status: AC
  Administered 2011-11-22: 400 mg via ORAL
  Filled 2011-11-22: qty 2

## 2011-11-22 MED ORDER — MOXIFLOXACIN HCL 400 MG PO TABS: 400.0000 mg | ORAL_TABLET | Freq: Every day | ORAL | Status: AC

## 2011-11-22 MED ORDER — MOMETASONE FUROATE 50 MCG/ACT NA SUSP: 2.0000 | Freq: Every day | NASAL | Status: DC

## 2011-11-22 NOTE — ED Provider Notes (Signed)
History     CSN: [id number]  Arrival date & time 11/22/11  1500   First MD Initiated Contact with Patient 11/22/11 1544      Chief Complaint  Patient presents with  . Nasal Congestion    (Consider location/radiation/quality/duration/timing/severity/associated sxs/prior treatment) HPI Comments: Patient reports one day of nasal congestion and left-sided facial pain and left ear pain. She also has a headache across her forehead. She denies fever or chills no sore throat or cough. Family was slightly concerned because she had a history of a left-sided aneurysm. I reviewed the patient's old records and it turns out that the patient actually had an aneurysm on the right side. She also has a long history of repeated sinusitis. She reports that she is congested in both sinuses. She's not taking any medications prior to arrival. She also smokes cigarettes daily. Have encouraged her to stop smoking. Her primary care physician is Dr. Renaye Rakers.  The history is provided by the patient and a relative.    Past Medical History  Diagnosis Date  . Hypertension   . Aneurysm     Past Surgical History  Procedure Date  . Total knee arthroplasty     No family history on file.  History  Substance Use Topics  . Smoking status: Current Everyday Smoker  . Smokeless tobacco: Not on file  . Alcohol Use: No    OB History    Grav Para Term Preterm Abortions TAB SAB Ect Mult Living                  Review of Systems  Constitutional: Negative for fever and chills.  HENT: Positive for ear pain, congestion, rhinorrhea, sneezing, postnasal drip and sinus pressure. Negative for nosebleeds, sore throat, trouble swallowing, voice change, tinnitus and ear discharge.   Eyes: Negative for discharge and redness.  Respiratory: Negative for cough.   Cardiovascular: Negative for chest pain.  Gastrointestinal: Negative for nausea, vomiting and abdominal pain.  Neurological: Positive for headaches. Negative  for dizziness, weakness, light-headedness and numbness.  All other systems reviewed and are negative.    Allergies  Review of patient's allergies indicates no known allergies.  Home Medications   Current Outpatient Rx  Name Route Sig Dispense Refill  . PSEUDOEPHEDRINE-ACETAMINOPHEN 30-500 MG PO TABS Oral Take 2 tablets by mouth every 4 (four) hours as needed. For sinus pressure    . MOMETASONE FUROATE 50 MCG/ACT NA SUSP Nasal Place 2 sprays into the nose daily. 17 g 12  . MOXIFLOXACIN HCL 400 MG PO TABS Oral Take 1 tablet (400 mg total) by mouth daily. 10 tablet 0    BP 138/79  Pulse 81  Temp(Src) 98.2 F (36.8 C) (Oral)  Resp 18  SpO2 98%  Physical Exam  Nursing note and vitals reviewed. Constitutional: She appears well-developed and well-nourished.  HENT:  Head: Normocephalic and atraumatic.  Right Ear: Hearing and tympanic membrane normal.  Left Ear: Hearing and tympanic membrane normal.  Nose: Mucosal edema, rhinorrhea and sinus tenderness present. No nasal septal hematoma. No epistaxis. Right sinus exhibits frontal sinus tenderness. Right sinus exhibits no maxillary sinus tenderness. Left sinus exhibits frontal sinus tenderness. Left sinus exhibits no maxillary sinus tenderness.  Eyes: Pupils are equal, round, and reactive to light. Right conjunctiva is injected. Left conjunctiva is injected.  Pulmonary/Chest: Effort normal. She has no decreased breath sounds. She has no wheezes. She has no rhonchi.  Neurological: She has normal strength. No sensory deficit.    ED Course  Procedures (including critical care time)  Labs Reviewed - No data to display No results found.   1. Sinusitis acute       MDM   I reviewed the patient's prior records. The patient's former aneurysm was more than 10 years ago and was located on the right side. No clinical suspicion for brain aneurysm. The patient clearly has nasal congestion and likely has sinusitis. Given her prior record  even though she has no fever here I'm okay giving her oral antibiotics for sinusitis as well as nasal decongestant with nasal steroids. Have encouraged the patient to stop smoking. I also told the patient and family to go ahead and make a followup appointment with her regular doctor for next week so that they know that she is improving by that time. Her room air saturations are 98% which is normal.        Gavin Pound. Oletta Lamas, MD 11/22/11 [phone]

## 2011-11-22 NOTE — Discharge Instructions (Signed)
 Sinusitis Sinuses are air pockets within the bones of your face. The growth of bacteria within a sinus leads to infection. The infection prevents the sinuses from draining. This infection is called sinusitis. SYMPTOMS  There will be different areas of pain depending on which sinuses have become infected.  The maxillary sinuses often produce pain beneath the eyes.   Frontal sinusitis may cause pain in the middle of the forehead and above the eyes.  Other problems (symptoms) include:  Toothaches.   Colored, pus-like (purulent) drainage from the nose.   Swelling, warmth, and tenderness over the sinus areas may be signs of infection.  TREATMENT  Sinusitis is most often determined by an exam.X-rays may be taken. If x-rays have been taken, make sure you obtain your results or find out how you are to obtain them. Your caregiver may give you medications (antibiotics). These are medications that will help kill the bacteria causing the infection. You may also be given a medication (decongestant) that helps to reduce sinus swelling.  HOME CARE INSTRUCTIONS   Only take over-the-counter or prescription medicines for pain, discomfort, or fever as directed by your caregiver.   Drink extra fluids. Fluids help thin the mucus so your sinuses can drain more easily.   Applying either moist heat or ice packs to the sinus areas may help relieve discomfort.   Use saline nasal sprays to help moisten your sinuses. The sprays can be found at your local drugstore.  SEEK IMMEDIATE MEDICAL CARE IF:  You have a fever.   You have increasing pain, severe headaches, or toothache.   You have nausea, vomiting, or drowsiness.   You develop unusual swelling around the face or trouble seeing.  MAKE SURE YOU:   Understand these instructions.   Will watch your condition.   Will get help right away if you are not doing well or get worse.  Document Released: 10/25/2005 Document Revised: 07/07/2011 Document Reviewed:  05/24/2007 [facility] Patient Information 2012 [place], [place].    Be sure to call and make an appointment with Dr. Benjamine for next week to make sure that your symptoms are improving.

## 2011-11-22 NOTE — ED Notes (Signed)
PT. REPORTS REPORTS NASAL CONGESTION / RUNNY NOSE ONSET THIS MORNING , DENIES COUGH OR SOB , NO FEVER OR CHILLS.

## 2012-01-12 ENCOUNTER — Other Ambulatory Visit: Payer: Self-pay

## 2012-02-18 ENCOUNTER — Other Ambulatory Visit: Payer: Self-pay | Admitting: Obstetrics

## 2012-02-24 ENCOUNTER — Encounter (HOSPITAL_COMMUNITY): Payer: Self-pay | Admitting: Pharmacist

## 2012-03-02 ENCOUNTER — Encounter (HOSPITAL_COMMUNITY): Payer: Self-pay

## 2012-03-02 ENCOUNTER — Other Ambulatory Visit: Payer: Self-pay

## 2012-03-02 ENCOUNTER — Encounter (HOSPITAL_COMMUNITY)
Admission: RE | Admit: 2012-03-02 | Discharge: 2012-03-02 | Disposition: A | Discharge status: Home / Self Care | Payer: Medicare Other | Source: Ambulatory Visit | Attending: Obstetrics | Admitting: Obstetrics

## 2012-03-02 DIAGNOSIS — Z01818 Encounter for other preprocedural examination: Secondary | ICD-10-CM | POA: Insufficient documentation

## 2012-03-02 DIAGNOSIS — Z01812 Encounter for preprocedural laboratory examination: Secondary | ICD-10-CM | POA: Insufficient documentation

## 2012-03-02 HISTORY — DX: Cerebral infarction, unspecified: I63.9

## 2012-03-02 LAB — BASIC METABOLIC PANEL
CO2: 30 mEq/L (ref 19–32)
Chloride: 106 mEq/L (ref 96–112)
Glucose, Bld: 85 mg/dL (ref 70–99)
Potassium: 5.4 mEq/L — ABNORMAL HIGH (ref 3.5–5.1)
Sodium: 143 mEq/L (ref 135–145)

## 2012-03-02 LAB — CBC
Hemoglobin: 12.3 g/dL (ref 12.0–15.0)
RBC: 4.07 MIL/uL (ref 3.87–5.11)

## 2012-03-02 NOTE — Patient Instructions (Signed)
YOUR PROCEDURE IS SCHEDULED ON: 03/10/12  ENTER THROUGH THE MAIN ENTRANCE OF [facility] AT: 1130am USE DESK PHONE AND DIAL [phone] TO INFORM us OF YOUR ARRIVAL  CALL [phone] IF YOU HAVE ANY QUESTIONS OR PROBLEMS PRIOR TO YOUR ARRIVAL.  REMEMBER: DO NOT EAT AFTER MIDNIGHT : Thursday  SPECIAL INSTRUCTIONS:clear liquids ok until 9am on Friday   YOU MAY BRUSH YOUR TEETH THE MORNING OF SURGERY   TAKE THESE MEDICINES THE DAY OF SURGERY WITH SIP OF WATER:blood pressure pill   DO NOT WEAR JEWELRY, EYE MAKEUP, LIPSTICK OR DARK FINGERNAIL POLISH DO NOT WEAR LOTIONS  DO NOT SHAVE FOR 48 HOURS PRIOR TO SURGERY  YOU WILL NOT BE ALLOWED TO DRIVE YOURSELF HOME.  NAME OF DRIVER:Tabitha

## 2012-03-17 ENCOUNTER — Ambulatory Visit (HOSPITAL_COMMUNITY)
Admission: RE | Admit: 2012-03-17 | Discharge: 2012-03-17 | Disposition: A | Discharge status: Home / Self Care | Payer: Medicare Other | Source: Ambulatory Visit | Attending: Obstetrics | Admitting: Obstetrics

## 2012-03-17 ENCOUNTER — Encounter (HOSPITAL_COMMUNITY): Payer: Self-pay

## 2012-03-17 ENCOUNTER — Encounter (HOSPITAL_COMMUNITY): Payer: Self-pay | Admitting: Obstetrics

## 2012-03-17 ENCOUNTER — Ambulatory Visit (HOSPITAL_COMMUNITY): Payer: Medicare Other

## 2012-03-17 ENCOUNTER — Encounter (HOSPITAL_COMMUNITY)
Admission: RE | Disposition: A | Discharge status: Home / Self Care | Payer: Self-pay | Source: Ambulatory Visit | Attending: Obstetrics

## 2012-03-17 DIAGNOSIS — Z01818 Encounter for other preprocedural examination: Secondary | ICD-10-CM | POA: Insufficient documentation

## 2012-03-17 DIAGNOSIS — A630 Anogenital (venereal) warts: Secondary | ICD-10-CM | POA: Insufficient documentation

## 2012-03-17 DIAGNOSIS — Z01812 Encounter for preprocedural laboratory examination: Secondary | ICD-10-CM | POA: Insufficient documentation

## 2012-03-17 HISTORY — PX: VULVAR LESION REMOVAL: SHX5391

## 2012-03-17 SURGERY — VULVAR LESION
Anesthesia: General | Site: Vulva | Wound class: Clean Contaminated

## 2012-03-17 MED ORDER — NONFORMULARY OR COMPOUNDED ITEM: 1.0000 | Status: AC | PRN

## 2012-03-17 MED ORDER — LIDOCAINE HCL (CARDIAC) 20 MG/ML IV SOLN
INTRAVENOUS | Status: AC
  Filled 2012-03-17: qty 5

## 2012-03-17 MED ORDER — FENTANYL CITRATE 0.05 MG/ML IJ SOLN
INTRAMUSCULAR | Status: DC | PRN
  Administered 2012-03-17 (×3): 50 ug via INTRAVENOUS

## 2012-03-17 MED ORDER — ONDANSETRON HCL 4 MG/2ML IJ SOLN
INTRAMUSCULAR | Status: DC | PRN
  Administered 2012-03-17: 4 mg via INTRAVENOUS

## 2012-03-17 MED ORDER — OXYCODONE-ACETAMINOPHEN 5-325 MG PO TABS: 1.0000 | ORAL_TABLET | ORAL | Status: DC | PRN

## 2012-03-17 MED ORDER — FENTANYL CITRATE 0.05 MG/ML IJ SOLN
INTRAMUSCULAR | Status: AC
  Filled 2012-03-17: qty 5

## 2012-03-17 MED ORDER — BUPIVACAINE HCL (PF) 0.25 % IJ SOLN
INTRAMUSCULAR | Status: AC
  Filled 2012-03-17: qty 30

## 2012-03-17 MED ORDER — ESTRADIOL 0.1 MG/GM VA CREA
TOPICAL_CREAM | VAGINAL | Status: AC
  Filled 2012-03-17: qty 42.5

## 2012-03-17 MED ORDER — ONDANSETRON HCL 4 MG/2ML IJ SOLN
INTRAMUSCULAR | Status: AC
  Filled 2012-03-17: qty 2

## 2012-03-17 MED ORDER — SUCCINYLCHOLINE CHLORIDE 20 MG/ML IJ SOLN
INTRAMUSCULAR | Status: AC
  Filled 2012-03-17: qty 10

## 2012-03-17 MED ORDER — PROPOFOL 10 MG/ML IV EMUL
INTRAVENOUS | Status: AC
  Filled 2012-03-17: qty 20

## 2012-03-17 MED ORDER — BUPIVACAINE HCL (PF) 0.25 % IJ SOLN
INTRAMUSCULAR | Status: DC | PRN
  Administered 2012-03-17: 11 mL

## 2012-03-17 MED ORDER — KETOROLAC TROMETHAMINE 30 MG/ML IJ SOLN
INTRAMUSCULAR | Status: DC | PRN
  Administered 2012-03-17: 15 mg via INTRAVENOUS

## 2012-03-17 MED ORDER — PROPOFOL 10 MG/ML IV EMUL
INTRAVENOUS | Status: DC | PRN
  Administered 2012-03-17: 155 mg via INTRAVENOUS

## 2012-03-17 MED ORDER — NONFORMULARY OR COMPOUNDED ITEM
1.0000 | Status: DC
  Filled 2012-03-17: qty 1

## 2012-03-17 MED ORDER — EPHEDRINE SULFATE 50 MG/ML IJ SOLN
INTRAMUSCULAR | Status: DC | PRN
  Administered 2012-03-17: 10 mg via INTRAVENOUS
  Administered 2012-03-17: 5 mg via INTRAVENOUS
  Administered 2012-03-17: 10 mg via INTRAVENOUS

## 2012-03-17 MED ORDER — FENTANYL CITRATE 0.05 MG/ML IJ SOLN: 25.0000 ug | INTRAMUSCULAR | Status: DC | PRN

## 2012-03-17 MED ORDER — LACTATED RINGERS IV SOLN
INTRAVENOUS | Status: DC
  Administered 2012-03-17: 125 mL/h via INTRAVENOUS

## 2012-03-17 MED ORDER — LIDOCAINE HCL (CARDIAC) 20 MG/ML IV SOLN
INTRAVENOUS | Status: DC | PRN
  Administered 2012-03-17: 30 mg via INTRAVENOUS

## 2012-03-17 MED ORDER — EPHEDRINE 5 MG/ML INJ
INTRAVENOUS | Status: AC
  Filled 2012-03-17: qty 10

## 2012-03-17 MED ORDER — DIPHENHYDRAMINE HCL 50 MG/ML IJ SOLN
INTRAMUSCULAR | Status: DC | PRN
  Administered 2012-03-17: 12.5 mg via INTRAVENOUS

## 2012-03-17 MED ORDER — GLYCOPYRROLATE 0.2 MG/ML IJ SOLN
INTRAMUSCULAR | Status: AC
  Filled 2012-03-17: qty 1

## 2012-03-17 MED ORDER — KETOROLAC TROMETHAMINE 30 MG/ML IJ SOLN
INTRAMUSCULAR | Status: AC
  Filled 2012-03-17: qty 1

## 2012-03-17 MED ORDER — KETOROLAC TROMETHAMINE 30 MG/ML IJ SOLN: 30.0000 mg | Freq: Once | INTRAMUSCULAR | Status: DC

## 2012-03-17 MED ORDER — MIDAZOLAM HCL 2 MG/2ML IJ SOLN
INTRAMUSCULAR | Status: AC
  Filled 2012-03-17: qty 2

## 2012-03-17 SURGICAL SUPPLY — 19 items
BLADE SURG 11 STRL SS (BLADE) ×2 IMPLANT
CATH ROBINSON RED A/P 14FR (CATHETERS) ×2 IMPLANT
CLOTH BEACON ORANGE TIMEOUT ST (SAFETY) ×2 IMPLANT
CONTAINER PREFILL 10% NBF 60ML (FORM) ×2 IMPLANT
DECANTER SPIKE VIAL GLASS SM (MISCELLANEOUS) ×2 IMPLANT
GLOVE BIO SURGEON STRL SZ8 (GLOVE) ×4 IMPLANT
GOWN PREVENTION PLUS LG XLONG (DISPOSABLE) ×2 IMPLANT
GOWN PREVENTION PLUS XLARGE (GOWN DISPOSABLE) ×2 IMPLANT
LIDOCAINE 1% IMPLANT
LIDOCAINE OINTMENT 5% 1:1 SILVADENE 1% ×2 IMPLANT
NEEDLE HYPO 22GX1.5 SAFETY (NEEDLE) ×2 IMPLANT
NEEDLE SPNL 22GX3.5 QUINCKE BK (NEEDLE) IMPLANT
PACK VAGINAL WOMENS (CUSTOM PROCEDURE TRAY) ×2 IMPLANT
SUT CHROMIC 3 0 SH 27 (SUTURE) IMPLANT
SUT VIC AB 3-0 SH 27 (SUTURE)
SUT VIC AB 3-0 SH 27X BRD (SUTURE) IMPLANT
TOWEL OR 17X24 6PK STRL BLUE (TOWEL DISPOSABLE) ×4 IMPLANT
TRAY FOLEY CATH 14FR (SET/KITS/TRAYS/PACK) IMPLANT
WATER STERILE IRR 1000ML POUR (IV SOLUTION) ×2 IMPLANT

## 2012-03-17 NOTE — Discharge Instructions (Signed)
Vulva Biopsy Care After These instructions give you information on caring for yourself after your procedure. Your doctor may also give you more specific instructions. Call your doctor if you have any problems or questions after your procedure. HOME CARE   Only take medicine as told by your doctor.   Wash your hands with soap and water before and after you touch your vulva.   Wash the biopsy area 1 to 2 times a day. Use a mild, unscented soap and rinse with water. Gently pat the area dry with a clean towel. Do not rub the area dry.   Check the biopsy area with a hand-held mirror to be sure it is healing well.   Do not have sex (intercourse) for 1 week. Ask your doctor when it is okay.   Wear loose cotton underwear. Do not wear tight-fitting pants.   Keep all follow-up visits with your doctor.  Finding out the results of your test Ask when your test results will be ready. Make sure you get your test results. GET HELP RIGHT AWAY IF:   You have pain that gets worse and is not helped by pain medicine.   Your vulva becomes puffy (swollen) or red.   You have a bad smell or fluid coming from your vulva.   You have a temperature by mouth above 102 F (38.9 C), not controlled by medicine.  MAKE SURE YOU:  Understand these instructions.   Will watch your condition.   Will get help right away if you are not doing well or get worse.  Document Released: 01/21/2009 Document Revised: 10/14/2011 Document Reviewed: 01/21/2009 [employer] Patient Information 2012 [employer], LLCGenital Warts Genital warts are caused by a germ (human papillomavirus, HPV). This germ is spread by having unprotected sex (intercourse) with an infected person. It can be spread by vaginal, anal, and oral sex. A person who is infected might not show any signs or problems. HOME CARE  Follow your doctor's treatment instructions.   Do not use medicine that is meant for hand warts. Only take medicine as told by your  doctor.   Tell your past and current sex partner(s) that you have genital warts. They may need treatment.   Avoid sexual contact while you are being treated.   Do not touch or scratch the warts. You could spread it to other parts of your body.   Women with genital warts should have a cervical cancer check (Pap test) at least once a year.   Tell your doctor if you become pregnant. The germ can be passed to the baby.   After treatment, use a condom during sex.   Ask your doctor before using anti-itch creams.  GET HELP RIGHT AWAY IF:   Your treated skin becomes red, puffy (swollen), or painful.   You have a fever.   You feel generally sick.   You feel little lumps in and around your genital area.   You are bleeding or have pain during sex.  MAKE SURE YOU:   Understand these instructions.   Will watch your condition.   Will get help right away if you are not doing well or get worse.  Document Released: 01/19/2010 Document Revised: 10/14/2011 Document Reviewed: 05/03/2011 [facility] Patient Information 2012 [place], [place].Marland Kitchen

## 2012-03-17 NOTE — Anesthesia Postprocedure Evaluation (Signed)
  Anesthesia Post-op Note  Patient: Jamie Haas  Procedure(s) Performed: Procedure(s) (LRB): VULVAR LESION (N/A)  Patient Location: PACU  Anesthesia Type: General  Level of Consciousness: awake, alert  and oriented  Airway and Oxygen Therapy: Patient Spontanous Breathing  Post-op Pain: none  Post-op Assessment: Post-op Vital signs reviewed, Patient's Cardiovascular Status Stable, Respiratory Function Stable, Patent Airway, No signs of Nausea or vomiting, Adequate PO intake and Pain level controlled  Post-op Vital Signs: Reviewed and stable  Complications: No apparent anesthesia complications

## 2012-03-17 NOTE — Op Note (Signed)
CO 2 Laser Vaporization: The patient was brought to OR room #4.  Placed in supine position and general endotracheal anesthesia was induced.  The legs were brought up in stirrups and the vagina was prepped and draped in routine sterile fashion.  The urinary bladder was emptied of ~ 50 ml of clear urine.  A time out was done with the OR staff and anesthesia.  The condyloma were identified in the perineal area.  One large condyloma on the left perineum was injected with .25% Bupivicaine and excised sharply with a #11 scalpel and submitted to pathology.  The base was vaporized at 10 watts power.  The remainder of the condyloma were injected with Bupivicaine and vaporized at 10 watts power.  A 50/50 mixture of 5% Xylocaine ointment and 1% Silvadene cream was applied to the lasered areas.  The procedure was then terminated.  Anesthtesia was reversed, and the patient was taken to the recovery room in satisfactory condition.

## 2012-03-17 NOTE — Transfer of Care (Signed)
Immediate Anesthesia Transfer of Care Note  Patient: Jamie Haas  Procedure(s) Performed: Procedure(s) (LRB): VULVAR LESION (N/A)  Patient Location: PACU  Anesthesia Type: General  Level of Consciousness: awake, oriented, patient cooperative and responds to stimulation  Airway & Oxygen Therapy: Patient Spontanous Breathing and Patient connected to nasal cannula oxygen  Post-op Assessment: Post -op Vital signs reviewed and stable and Patient moving all extremities  Post vital signs: Reviewed and stable  Complications: No apparent anesthesia complications

## 2012-03-17 NOTE — Anesthesia Preprocedure Evaluation (Signed)
Anesthesia Evaluation  Patient identified by MRN, date of birth, ID band Patient awake    Reviewed: Allergy & Precautions, H&P , Patient's Chart, lab work & pertinent test results, reviewed documented beta blocker date and time   Airway Mallampati: II TM Distance: >3 FB Neck ROM: full    Dental No notable dental hx. (+) Edentulous Upper   Pulmonary  breath sounds clear to auscultation  Pulmonary exam normal       Cardiovascular hypertension, Pt. on medications Rhythm:regular Rate:Normal     Neuro/Psych    GI/Hepatic   Endo/Other    Renal/GU      Musculoskeletal   Abdominal   Peds  Hematology   Anesthesia Other Findings   Reproductive/Obstetrics                           Anesthesia Physical Anesthesia Plan  ASA: II  Anesthesia Plan: General   Post-op Pain Management:    Induction: Intravenous  Airway Management Planned: LMA  Additional Equipment:   Intra-op Plan:   Post-operative Plan:   Informed Consent: I have reviewed the patients History and Physical, chart, labs and discussed the procedure including the risks, benefits and alternatives for the proposed anesthesia with the patient or authorized representative who has indicated his/her understanding and acceptance.   Dental Advisory Given  Plan Discussed with: CRNA and Surgeon  Anesthesia Plan Comments: (  Discussed  general anesthesia, including possible nausea, instrumentation of airway, sore throat,pulmonary aspiration, etc. I asked if the were any outstanding questions, or  concerns before we proceeded. )        Anesthesia Quick Evaluation

## 2012-03-17 NOTE — H&P (Signed)
Jamie Haas is an 66 y.o. female. Has condyloma of vulva/perineal area.  Pertinent Gynecological History: Menses: post-menopausal Bleeding: none Contraception: post menopausal status    Menstrual History: Menarche age: 57 No LMP recorded. Patient is postmenopausal.    Past Medical History  Diagnosis Date  . Hypertension   . Aneurysm   . Stroke 2011    Past Surgical History  Procedure Date  . Total knee arthroplasty   . Brain surgery 2011    coils inserted    No family history on file.  Social History:  reports that she has been smoking.  She does not have any smokeless tobacco history on file. She reports that she does not drink alcohol or use illicit drugs.  Allergies: No Known Allergies  Prescriptions prior to admission  Medication Sig Dispense Refill  . celecoxib (CELEBREX) 200 MG capsule Take 200 mg by mouth daily.      . valsartan-hydrochlorothiazide (DIOVAN-HCT) 160-12.5 MG per tablet Take 1 tablet by mouth daily.      . mometasone (NASONEX) 50 MCG/ACT nasal spray Place 2 sprays into the nose daily.  17 g  12    Review of Systems  All other systems reviewed and are negative.    Blood pressure 138/77, pulse 66, temperature 98.2 F (36.8 C), temperature source Oral, resp. rate 16, SpO2 100.00%. Physical Exam  Nursing note and vitals reviewed. Constitutional: She appears well-developed and well-nourished.  HENT:  Head: Normocephalic.  Neck: Normal range of motion. Neck supple.  Cardiovascular: Normal rate and regular rhythm.   Respiratory: Effort normal.  GI: Soft.  Skin: Skin is warm and dry.    No results found for this or any previous visit (from the past 24 hour(s)).  No results found.  Assessment/Plan: Condyloma of vulva/perineum.  CO2 Laser Vaporization.  Ossiel Marchio A 03/17/2012, 12:33 PM

## 2012-03-17 NOTE — OR Nursing (Signed)
Lidocaine 5% ointment 1:1/Silvadene 1% 30 gm.topical to perineum/vulva @ end of case.

## 2012-03-21 ENCOUNTER — Encounter (HOSPITAL_COMMUNITY): Payer: Self-pay | Admitting: Obstetrics

## 2012-06-21 ENCOUNTER — Emergency Department (HOSPITAL_COMMUNITY)
Admission: EM | Admit: 2012-06-21 | Discharge: 2012-06-22 | Disposition: A | Discharge status: Home / Self Care | Payer: PRIVATE HEALTH INSURANCE | Attending: Emergency Medicine | Admitting: Emergency Medicine

## 2012-06-21 ENCOUNTER — Encounter (HOSPITAL_COMMUNITY): Payer: Self-pay | Admitting: Adult Health

## 2012-06-21 ENCOUNTER — Emergency Department (HOSPITAL_COMMUNITY): Payer: PRIVATE HEALTH INSURANCE

## 2012-06-21 DIAGNOSIS — W19XXXA Unspecified fall, initial encounter: Secondary | ICD-10-CM

## 2012-06-21 DIAGNOSIS — R079 Chest pain, unspecified: Secondary | ICD-10-CM | POA: Insufficient documentation

## 2012-06-21 DIAGNOSIS — I10 Essential (primary) hypertension: Secondary | ICD-10-CM | POA: Insufficient documentation

## 2012-06-21 DIAGNOSIS — F17200 Nicotine dependence, unspecified, uncomplicated: Secondary | ICD-10-CM | POA: Insufficient documentation

## 2012-06-21 DIAGNOSIS — Z8673 Personal history of transient ischemic attack (TIA), and cerebral infarction without residual deficits: Secondary | ICD-10-CM | POA: Insufficient documentation

## 2012-06-21 DIAGNOSIS — R0781 Pleurodynia: Secondary | ICD-10-CM

## 2012-06-21 NOTE — ED Notes (Signed)
Pt fell out of a porch chair onto the dirt below porch aprox 3-5 feet and landed on back.  Pt states her porch chair is cracked and that is how she fell out of chair. C/o right sided pain that became worse when she picked up her pocket book. Denies SOB, denies pain with inspiration. Bilateral lung sounds clear. No bruising noted.

## 2012-06-22 MED ORDER — HYDROCODONE-ACETAMINOPHEN 5-325 MG PO TABS: 1.0000 | ORAL_TABLET | ORAL | Status: AC | PRN

## 2012-06-22 MED ORDER — HYDROCODONE-ACETAMINOPHEN 5-325 MG PO TABS
1.0000 | ORAL_TABLET | Freq: Once | ORAL | Status: AC
  Administered 2012-06-22: 1 via ORAL
  Filled 2012-06-22: qty 1

## 2012-06-22 NOTE — ED Notes (Signed)
Pt denies any questions upon discharge. 

## 2012-06-25 NOTE — ED Provider Notes (Signed)
Medical screening examination/treatment/procedure(s) were performed by non-physician practitioner and as supervising physician I was immediately available for consultation/collaboration.   Glynn Octave, MD 06/25/12 [phone]

## 2012-06-25 NOTE — ED Provider Notes (Signed)
History     CSN: [id number]  Arrival date & time 06/21/12  2210   First MD Initiated Contact with Patient 06/22/12 0026      Chief Complaint  Patient presents with  . Fall    (Consider location/radiation/quality/duration/timing/severity/associated sxs/prior treatment) HPI Comments: Pt was sitting in a plastic chair on her porch which was cracked and fell apart. She fell from the chair about 3-5 feet onto dirt below the porch, striking her R side. This happened nearly a week ago. She denies hitting her head. Has not had any neck, back pain. Has had persistent pain to the area. Pain does not worsen with breathing but does worsen with movement. No tx prior to coming here.  Patient is a 66 y.o. female presenting with fall. The history is provided by the patient.  Fall The accident occurred more than 2 days ago. The fall occurred from a stool. She fell from a height of 3 to 5 ft. She landed on dirt. There was no blood loss. Point of impact: R side. Pain location: R side. The pain is moderate. She was ambulatory at the scene. There was no entrapment after the fall. There was no drug use involved in the accident. There was no alcohol use involved in the accident. Pertinent negatives include no numbness, no abdominal pain, no nausea, no vomiting, no headaches and no tingling. The symptoms are aggravated by pressure on the injury, rotation and flexion.    Past Medical History  Diagnosis Date  . Hypertension   . Aneurysm   . Stroke 2011    Past Surgical History  Procedure Date  . Total knee arthroplasty   . Brain surgery 2011    coils inserted  . Vulvar lesion removal 03/17/2012    Procedure: VULVAR LESION;  Surgeon: Brock Bad, MD;  Location: [facility];  Service: Gynecology;  Laterality: N/A;  CO2 Laser Vaporization Of Condyloma    History reviewed. No pertinent family history.  History  Substance Use Topics  . Smoking status: Current Everyday Smoker -- 1.5 packs/day  . Smokeless  tobacco: Not on file  . Alcohol Use: No    OB History    Grav Para Term Preterm Abortions TAB SAB Ect Mult Living                  Review of Systems  Constitutional: Negative.   HENT: Negative for neck pain.   Respiratory: Negative for cough and shortness of breath.   Cardiovascular: Positive for chest pain (R chest wall pain).  Gastrointestinal: Negative for nausea, vomiting and abdominal pain.  Musculoskeletal: Negative for back pain.  Neurological: Negative for tingling, syncope, numbness and headaches.    Allergies  Review of patient's allergies indicates no known allergies.  Home Medications   Current Outpatient Rx  Name Route Sig Dispense Refill  . CELECOXIB 200 MG PO CAPS Oral Take 200 mg by mouth daily.    Marland Kitchen VALSARTAN-HYDROCHLOROTHIAZIDE 160-12.5 MG PO TABS Oral Take 1 tablet by mouth daily.    Marland Kitchen HYDROCODONE-ACETAMINOPHEN 5-325 MG PO TABS Oral Take 1 tablet by mouth every 4 (four) hours as needed for pain. 20 tablet 0  . OXYCODONE-ACETAMINOPHEN 5-325 MG PO TABS Oral Take 1 tablet by mouth every 4 (four) hours as needed for pain. 40 tablet 0    BP 110/73  Pulse 78  Temp 97.9 F (36.6 C) (Oral)  Resp 18  SpO2 100%  Physical Exam  Nursing note and vitals reviewed. Constitutional: She appears well-developed  and well-nourished. No distress.  HENT:  Head: Normocephalic and atraumatic.  Neck: Normal range of motion.  Cardiovascular: Normal rate, regular rhythm and normal heart sounds.   Pulmonary/Chest: Effort normal and breath sounds normal. She exhibits tenderness.         ttp over R chest wall as diagrammed, no crepitus, deformity, subq emphysema; no brusing  Abdominal: Soft. There is no tenderness.  Musculoskeletal: Normal range of motion.  Neurological: She is alert.  Skin: Skin is warm and dry. She is not diaphoretic.  Psychiatric: She has a normal mood and affect.    ED Course  Procedures (including critical care time)  Labs Reviewed - No data to  display No results found.   1. Fall   2. Rib pain on right side       MDM  Pt presents with pain to the R chest wall after fall several days ago. Denies pain elsewhere. ttp to lower anterior ribs as diagrammed. Plain films negative. Discussed with family about need for serial xrays to r/o occult fx should she have persistent pain. Reasons to return discussed. Pt and family verbalized understanding, agreeable with plan.       Grant Fontana, PA-C 06/25/12 [phone]

## 2012-09-19 ENCOUNTER — Other Ambulatory Visit (HOSPITAL_COMMUNITY): Payer: Self-pay | Admitting: Family Medicine

## 2012-09-19 DIAGNOSIS — Z1231 Encounter for screening mammogram for malignant neoplasm of breast: Secondary | ICD-10-CM

## 2012-10-20 ENCOUNTER — Ambulatory Visit (HOSPITAL_COMMUNITY): Payer: PRIVATE HEALTH INSURANCE

## 2012-11-13 ENCOUNTER — Ambulatory Visit (HOSPITAL_COMMUNITY): Payer: PRIVATE HEALTH INSURANCE

## 2012-11-21 ENCOUNTER — Ambulatory Visit (HOSPITAL_COMMUNITY)
Admission: RE | Admit: 2012-11-21 | Discharge: 2012-11-21 | Disposition: A | Discharge status: Home / Self Care | Payer: PRIVATE HEALTH INSURANCE | Source: Ambulatory Visit | Attending: Family Medicine | Admitting: Family Medicine

## 2012-11-21 DIAGNOSIS — Z1231 Encounter for screening mammogram for malignant neoplasm of breast: Secondary | ICD-10-CM | POA: Insufficient documentation

## 2013-12-08 ENCOUNTER — Encounter (HOSPITAL_COMMUNITY): Payer: Self-pay | Admitting: Emergency Medicine

## 2013-12-08 ENCOUNTER — Emergency Department (HOSPITAL_COMMUNITY)
Admission: EM | Admit: 2013-12-08 | Discharge: 2013-12-08 | Disposition: A | Discharge status: Home / Self Care | Payer: PRIVATE HEALTH INSURANCE | Attending: Emergency Medicine | Admitting: Emergency Medicine

## 2013-12-08 ENCOUNTER — Emergency Department (HOSPITAL_COMMUNITY): Payer: PRIVATE HEALTH INSURANCE

## 2013-12-08 DIAGNOSIS — M1712 Unilateral primary osteoarthritis, left knee: Secondary | ICD-10-CM

## 2013-12-08 DIAGNOSIS — I10 Essential (primary) hypertension: Secondary | ICD-10-CM | POA: Insufficient documentation

## 2013-12-08 DIAGNOSIS — K5900 Constipation, unspecified: Secondary | ICD-10-CM | POA: Insufficient documentation

## 2013-12-08 DIAGNOSIS — Z8673 Personal history of transient ischemic attack (TIA), and cerebral infarction without residual deficits: Secondary | ICD-10-CM | POA: Insufficient documentation

## 2013-12-08 DIAGNOSIS — M129 Arthropathy, unspecified: Secondary | ICD-10-CM | POA: Insufficient documentation

## 2013-12-08 DIAGNOSIS — M25569 Pain in unspecified knee: Secondary | ICD-10-CM | POA: Insufficient documentation

## 2013-12-08 DIAGNOSIS — Z79899 Other long term (current) drug therapy: Secondary | ICD-10-CM | POA: Insufficient documentation

## 2013-12-08 DIAGNOSIS — R609 Edema, unspecified: Secondary | ICD-10-CM | POA: Insufficient documentation

## 2013-12-08 DIAGNOSIS — F17200 Nicotine dependence, unspecified, uncomplicated: Secondary | ICD-10-CM | POA: Insufficient documentation

## 2013-12-08 HISTORY — DX: Unspecified osteoarthritis, unspecified site: M19.90

## 2013-12-08 MED ORDER — HYDROCODONE-ACETAMINOPHEN 5-325 MG PO TABS
1.0000 | ORAL_TABLET | Freq: Once | ORAL | Status: AC
  Administered 2013-12-08: 1 via ORAL
  Filled 2013-12-08: qty 1

## 2013-12-08 MED ORDER — HYDROCODONE-ACETAMINOPHEN 5-325 MG PO TABS: 1.0000 | ORAL_TABLET | ORAL | Status: DC | PRN

## 2013-12-08 NOTE — Discharge Instructions (Signed)
Arthritis, Nonspecific °Arthritis is inflammation of a joint. This usually means pain, redness, warmth or swelling are present. One or more joints may be involved. There are a number of types of arthritis. Your caregiver may not be able to tell what type of arthritis you have right away. °CAUSES  °The most common cause of arthritis is the wear and tear on the joint (osteoarthritis). This causes damage to the cartilage, which can break down over time. The knees, hips, back and neck are most often affected by this type of arthritis. °Other types of arthritis and common causes of joint pain include: °· Sprains and other injuries near the joint. Sometimes minor sprains and injuries cause pain and swelling that develop hours later. °· Rheumatoid arthritis. This affects hands, feet and knees. It usually affects both sides of your body at the same time. It is often associated with chronic ailments, fever, weight loss and general weakness. °· Crystal arthritis. Gout and pseudo gout can cause occasional acute severe pain, redness and swelling in the foot, ankle, or knee. °· Infectious arthritis. Bacteria can get into a joint through a break in overlying skin. This can cause infection of the joint. Bacteria and viruses can also spread through the blood and affect your joints. °· Drug, infectious and allergy reactions. Sometimes joints can become mildly painful and slightly swollen with these types of illnesses. °SYMPTOMS  °· Pain is the main symptom. °· Your joint or joints can also be red, swollen and warm or hot to the touch. °· You may have a fever with certain types of arthritis, or even feel overall ill. °· The joint with arthritis will hurt with movement. Stiffness is present with some types of arthritis. °DIAGNOSIS  °Your caregiver will suspect arthritis based on your description of your symptoms and on your exam. Testing may be needed to find the type of arthritis: °· Blood and sometimes urine tests. °· X-ray tests  and sometimes CT or MRI scans. °· Removal of fluid from the joint (arthrocentesis) is done to check for bacteria, crystals or other causes. Your caregiver (or a specialist) will numb the area over the joint with a local anesthetic, and use a needle to remove joint fluid for examination. This procedure is only minimally uncomfortable. °· Even with these tests, your caregiver may not be able to tell what kind of arthritis you have. Consultation with a specialist (rheumatologist) may be helpful. °TREATMENT  °Your caregiver will discuss with you treatment specific to your type of arthritis. If the specific type cannot be determined, then the following general recommendations may apply. °Treatment of severe joint pain includes: °· Rest. °· Elevation. °· Anti-inflammatory medication (for example, ibuprofen) may be prescribed. Avoiding activities that cause increased pain. °· Only take over-the-counter or prescription medicines for pain and discomfort as recommended by your caregiver. °· Cold packs over an inflamed joint may be used for 10 to 15 minutes every hour. Hot packs sometimes feel better, but do not use overnight. Do not use hot packs if you are diabetic without your caregiver's permission. °· A cortisone shot into arthritic joints may help reduce pain and swelling. °· Any acute arthritis that gets worse over the next 1 to 2 days needs to be looked at to be sure there is no joint infection. °Long-term arthritis treatment involves modifying activities and lifestyle to reduce joint stress jarring. This can include weight loss. Also, exercise is needed to nourish the joint cartilage and remove waste. This helps keep the muscles   around the joint strong. °HOME CARE INSTRUCTIONS  °· Do not take aspirin to relieve pain if gout is suspected. This elevates uric acid levels. °· Only take over-the-counter or prescription medicines for pain, discomfort or fever as directed by your caregiver. °· Rest the joint as much as  possible. °· If your joint is swollen, keep it elevated. °· Use crutches if the painful joint is in your leg. °· Drinking plenty of fluids may help for certain types of arthritis. °· Follow your caregiver's dietary instructions. °· Try low-impact exercise such as: °· Swimming. °· Water aerobics. °· Biking. °· Walking. °· Morning stiffness is often relieved by a warm shower. °· Put your joints through regular range-of-motion. °SEEK MEDICAL CARE IF:  °· You do not feel better in 24 hours or are getting worse. °· You have side effects to medications, or are not getting better with treatment. °SEEK IMMEDIATE MEDICAL CARE IF:  °· You have a fever. °· You develop severe joint pain, swelling or redness. °· Many joints are involved and become painful and swollen. °· There is severe back pain and/or leg weakness. °· You have loss of bowel or bladder control. °Document Released: 12/02/2004 Document Revised: 01/17/2012 Document Reviewed: 12/18/2008 °[employer]® Patient Information ©2014 [employer]. ° °Wear and Tear Disorders of the Knee (Arthritis, Osteoarthritis) °Everyone will experience wear and tear injuries (arthritis, osteoarthritis) of the knee. These are the changes we all get as we age. They come from the joint stress of daily living. The amount of cartilage damage in your knee and your symptoms determine if you need surgery. Mild problems require approximately two months recovery time. More severe problems take several months to recover. With mild problems, your surgeon may find worn and rough cartilage surfaces. With severe changes, your surgeon may find cartilage that has completely worn away and exposed the bone. Loose bodies of bone and cartilage, bone spurs (excess bone growth), and injuries to the menisci (cushions between the large bones of your leg) are also common. All of these problems can cause pain. °For a mild wear and tear problem, rough cartilage may simply need to be shaved and smoothed. For more  severe problems with areas of exposed bone, your surgeon may use an instrument for roughing up the bone surfaces to stimulate new cartilage growth. Loose bodies are usually removed. Torn menisci may be trimmed or repaired. °ABOUT THE ARTHROSCOPIC PROCEDURE °Arthroscopy is a surgical technique. It allows your orthopedic surgeon to diagnose and treat your knee injury with accuracy. The surgeon looks into your knee through a small scope. The scope is like a small (pencil-sized) telescope. Arthroscopy is less invasive than open knee surgery. You can expect a more rapid recovery. After the procedure, you will be moved to a recovery area until most of the effects of the medication have worn off. Your caregiver will discuss the test results with you. °RECOVERY °The severity of the arthritis and the type of procedure performed will determine recovery time. Other important factors include age, physical condition, medical conditions, and the type of rehabilitation program. Strengthening your muscles after arthroscopy helps guarantee a better recovery. Follow your caregiver's instructions. Use crutches, rest, elevate, ice, and do knee exercises as instructed. Your caregivers will help you and instruct you with exercises and other physical therapy required to regain your mobility, muscle strength, and functioning following surgery. Only take over-the-counter or prescription medicines for pain, discomfort, or fever as directed by your caregiver.  °SEEK MEDICAL CARE IF:  °· There   is increased bleeding (more than a small spot) from the wound. °· You notice redness, swelling, or increasing pain in the wound. °· Pus is coming from wound. °· You develop an unexplained oral temperature above 102° F (38.9° C) , or as your caregiver suggests. °· You notice a foul smell coming from the wound or dressing. °· You have severe pain with motion of the knee. °SEEK IMMEDIATE MEDICAL CARE IF:  °· You develop a rash. °· You have difficulty  breathing. °· You have any allergic problems. °MAKE SURE YOU:  °· Understand these instructions. °· Will watch your condition. °· Will get help right away if you are not doing well or get worse. °Document Released: 10/22/2000 Document Revised: 01/17/2012 Document Reviewed: 03/20/2008 °[employer]® Patient Information ©2014 [employer]. ° °

## 2013-12-08 NOTE — ED Notes (Signed)
Pt woke this am with severe L knee pain states it feel like her arthritis. She took her celebrex with no relief. Pain increased on ambulation. Ambulatory with a cane

## 2013-12-08 NOTE — ED Provider Notes (Signed)
Medical screening examination/treatment/procedure(s) were performed by non-physician practitioner and as supervising physician I was immediately available for consultation/collaboration.  EKG Interpretation   None         Sakari Raisanen M Jaydee Conran, MD 12/08/13 2302 

## 2013-12-08 NOTE — ED Provider Notes (Signed)
CSN: [id number]     Arrival date & time 12/08/13  1602 History   None    This chart was scribed for Lyanne CoKevin M Campos, MD by Arlan OrganAshley Leger, ED Scribe. This patient was seen in room TR08C/TR08C and the patient's care was started 4:41 PM.   Chief Complaint  Patient presents with  . Knee Pain   The history is provided by the patient. No language interpreter was used.    HPI Comments: Jamie Haas is a 68 y.o. Female with a PMHx of arthritis who presents to the Emergency Department complaining of constant, severe left sided knee pain described as her "arthritis" with associated mild swelling that woke her from sleep this morning. She also reports intermittent constipation. Pt states she has tried taking her prescribed Celebrex for her knee pain with no noticeably improvement. She says her discomfort is worsened upon ambulation, and denies any alleviating factors at this time. She denies any noticeable erythema to the area. Denies any numbness or tingling to her leg. Denies any abdominal pain, cough, SOB, or diarrhea. Pt currently ambulates with the assistant of a cane, and states she tries to take daily walks around the neighborhood to avoid inactivity. 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 Additional PMHx includes HTN, aneurysm, and stroke.  Pt is followed by Dr. Montez Moritaarter but is unaware of last follow up appointment  Past Medical History  Diagnosis Date  . Hypertension   . Aneurysm   . Stroke 2011  . Arthritis    Past Surgical History  Procedure Laterality Date  . Total knee arthroplasty    . Brain surgery  2011    coils inserted  . Vulvar lesion removal  03/17/2012    Procedure: VULVAR LESION;  Surgeon: Brock Badharles A Harper, MD;  Location: [facility];  Service: Gynecology;  Laterality: N/A;  CO2 Laser Vaporization Of Condyloma   History reviewed. No pertinent family history. History  Substance Use Topics  . Smoking status: Current Every Day Smoker -- 1.50 packs/day  .  Smokeless tobacco: Not on file  . Alcohol Use: No   OB History   Grav Para Term Preterm Abortions TAB SAB Ect Mult Living                 Review of Systems  Respiratory: Negative for cough and shortness of breath.   Gastrointestinal: Positive for constipation. Negative for abdominal pain and diarrhea.  Musculoskeletal: Positive for arthralgias (left sided knee pain).  All other systems reviewed and are negative.    Allergies  Review of patient's allergies indicates no known allergies.  Home Medications   Current Outpatient Rx  Name  Route  Sig  Dispense  Refill  . celecoxib (CELEBREX) 200 MG capsule   Oral   Take 200 mg by mouth daily.         Marland Kitchen. EXPIRED: oxyCODONE-acetaminophen (ROXICET) 5-325 MG per tablet   Oral   Take 1 tablet by mouth every 4 (four) hours as needed for pain.   40 tablet   0   . valsartan-hydrochlorothiazide (DIOVAN-HCT) 160-12.5 MG per tablet   Oral   Take 1 tablet by mouth daily.          Triage Vitals: BP 153/90  Pulse 89  Temp(Src) 98.1 F (36.7 C) (Oral)  Resp 21  SpO2 97%  Physical Exam  Nursing note and vitals reviewed. Constitutional: She is oriented to person, place, and time. She appears well-developed and well-nourished.  HENT:  Head: Normocephalic and atraumatic.  Mouth/Throat: Oropharynx is clear and moist.  Eyes: Conjunctivae are normal. No scleral icterus.  Neck: Normal range of motion. Neck supple.  Pulmonary/Chest: Effort normal.  Musculoskeletal: Normal range of motion. She exhibits edema and tenderness.       Right knee: She exhibits normal range of motion, no swelling, no effusion, no erythema, no LCL laxity and no MCL laxity. No tenderness found. No medial joint line, no lateral joint line and no patellar tendon tenderness noted.       Left knee: She exhibits swelling. She exhibits normal range of motion, no effusion, no deformity, no erythema, normal alignment, no LCL laxity and no MCL laxity. Tenderness found.  Medial joint line and lateral joint line tenderness noted. No patellar tendon tenderness noted.       Legs: Neurological: She is alert and oriented to person, place, and time. She exhibits normal muscle tone. Coordination normal.  Skin: Skin is warm and dry. No rash noted. No erythema. No pallor.  Psychiatric: She has a normal mood and affect. Her behavior is normal.    ED Course  Procedures (including critical care time)  DIAGNOSTIC STUDIES: Oxygen Saturation is 97% on RA, Normal by my interpretation.    COORDINATION OF CARE: 4:44 PM- Will order X-Ray. Discussed treatment plan with pt at bedside and pt agreed to plan.     Labs Review Labs Reviewed - No data to display Imaging Review Dg Knee Complete 4 Views Left  12/08/2013   CLINICAL DATA:  Left knee pain with history of surgery  EXAM: LEFT KNEE - COMPLETE 4+ VIEW  COMPARISON:  09/02/2004  FINDINGS: There is a tricompartmental knee replacement with components in anticipated position. There is no fracture or dislocation. No joint effusion appreciated.  IMPRESSION: No acute abnormalities.   Electronically Signed   By: Esperanza Heir M.D.   On: 12/08/2013 17:09    EKG Interpretation   None      Medications  HYDROcodone-acetaminophen (NORCO/VICODIN) 5-325 MG per tablet 1 tablet (1 tablet Oral Given 12/08/13 1728)     MDM  Left knee arthritis  Patient here with left knee pain - she has a history of left knee arthroplasty by Dr. Montez Morita, there is no clinical suspicion for septic joint, x-ray suggests normal findings following this surgery without effusion.  This is likely an exacerbation of her chronic arthritis.  Patient will follow up with ortho this week, but will continue the celebrex and the pain medication  I personally performed the services described in this documentation, which was scribed in my presence. The recorded information has been reviewed and is accurate.    Izola Price Marisue Humble, PA-C 12/08/13 1736

## 2014-01-02 ENCOUNTER — Emergency Department (HOSPITAL_COMMUNITY)
Admission: EM | Admit: 2014-01-02 | Discharge: 2014-01-02 | Disposition: A | Discharge status: Home / Self Care | Payer: PRIVATE HEALTH INSURANCE | Attending: Emergency Medicine | Admitting: Emergency Medicine

## 2014-01-02 ENCOUNTER — Encounter (HOSPITAL_COMMUNITY): Payer: Self-pay | Admitting: Emergency Medicine

## 2014-01-02 ENCOUNTER — Emergency Department (HOSPITAL_COMMUNITY): Payer: PRIVATE HEALTH INSURANCE

## 2014-01-02 DIAGNOSIS — Z8673 Personal history of transient ischemic attack (TIA), and cerebral infarction without residual deficits: Secondary | ICD-10-CM | POA: Insufficient documentation

## 2014-01-02 DIAGNOSIS — Z791 Long term (current) use of non-steroidal anti-inflammatories (NSAID): Secondary | ICD-10-CM | POA: Insufficient documentation

## 2014-01-02 DIAGNOSIS — Z79899 Other long term (current) drug therapy: Secondary | ICD-10-CM | POA: Insufficient documentation

## 2014-01-02 DIAGNOSIS — H9209 Otalgia, unspecified ear: Secondary | ICD-10-CM | POA: Insufficient documentation

## 2014-01-02 DIAGNOSIS — I10 Essential (primary) hypertension: Secondary | ICD-10-CM | POA: Insufficient documentation

## 2014-01-02 DIAGNOSIS — M129 Arthropathy, unspecified: Secondary | ICD-10-CM | POA: Insufficient documentation

## 2014-01-02 DIAGNOSIS — F17200 Nicotine dependence, unspecified, uncomplicated: Secondary | ICD-10-CM | POA: Insufficient documentation

## 2014-01-02 NOTE — Discharge Instructions (Signed)
Return here for fever, redness to the skin, or worsening pain

## 2014-01-02 NOTE — ED Notes (Signed)
Pt complaint of pain off and on to the loose skin present behind her right ear.

## 2014-01-02 NOTE — ED Provider Notes (Signed)
CSN: [id number]     Arrival date & time 01/02/14  4098 History   First MD Initiated Contact with Patient 01/02/14 [phone]     No chief complaint on file.    (Consider location/radiation/quality/duration/timing/severity/associated sxs/prior Treatment) The history is provided by the patient.  pt here with right posterior ear pain x 2 days--pain is colicky and not associated with fever or ear pain--used otc sinus meds without relief--no redness to the skin--no neck pain--sx persistent and nothing makes them better or worse  Past Medical History  Diagnosis Date  . Hypertension   . Aneurysm   . Stroke 2011  . Arthritis    Past Surgical History  Procedure Laterality Date  . Total knee arthroplasty    . Brain surgery  2011    coils inserted  . Vulvar lesion removal  03/17/2012    Procedure: VULVAR LESION;  Surgeon: Brock Bad, MD;  Location: [facility];  Service: Gynecology;  Laterality: N/A;  CO2 Laser Vaporization Of Condyloma   History reviewed. No pertinent family history. History  Substance Use Topics  . Smoking status: Current Every Day Smoker -- 1.50 packs/day  . Smokeless tobacco: Not on file  . Alcohol Use: No   OB History   Grav Para Term Preterm Abortions TAB SAB Ect Mult Living                 Review of Systems  All other systems reviewed and are negative.      Allergies  Review of patient's allergies indicates no known allergies.  Home Medications   Current Outpatient Rx  Name  Route  Sig  Dispense  Refill  . celecoxib (CELEBREX) 200 MG capsule   Oral   Take 200 mg by mouth daily.         Marland Kitchen HYDROcodone-acetaminophen (NORCO/VICODIN) 5-325 MG per tablet   Oral   Take 1 tablet by mouth every 4 (four) hours as needed for moderate pain.   30 tablet   0   . montelukast (SINGULAIR) 10 MG tablet   Oral   Take 10 mg by mouth daily.          Marland Kitchen Phenylephrine-Acetaminophen (TYLENOL SINUS CONGESTION/PAIN PO)   Oral   Take 2 tablets by mouth daily as  needed (congestion).         . valsartan-hydrochlorothiazide (DIOVAN-HCT) 160-12.5 MG per tablet   Oral   Take 1 tablet by mouth daily.          BP 119/70  Temp(Src) 98.2 F (36.8 C) (Oral) Physical Exam  Nursing note and vitals reviewed. Constitutional: She is oriented to person, place, and time. She appears well-developed and well-nourished.  Non-toxic appearance. No distress.  HENT:  Head: Normocephalic and atraumatic.    Eyes: Conjunctivae, EOM and lids are normal. Pupils are equal, round, and reactive to light.  Neck: Normal range of motion. Neck supple. No tracheal deviation present. No mass present.  Cardiovascular: Normal rate, regular rhythm and normal heart sounds.  Exam reveals no gallop.   No murmur heard. Pulmonary/Chest: Effort normal and breath sounds normal. No stridor. No respiratory distress. She has no decreased breath sounds. She has no wheezes. She has no rhonchi. She has no rales.  Abdominal: Soft. Normal appearance and bowel sounds are normal. She exhibits no distension. There is no tenderness. There is no rebound and no CVA tenderness.  Musculoskeletal: Normal range of motion. She exhibits no edema and no tenderness.  Neurological: She is alert and oriented to  person, place, and time. She has normal strength. No cranial nerve deficit or sensory deficit. GCS eye subscore is 4. GCS verbal subscore is 5. GCS motor subscore is 6.  Skin: Skin is warm and dry. No abrasion and no rash noted.  Psychiatric: She has a normal mood and affect. Her speech is normal and behavior is normal.    ED Course  Procedures (including critical care time) Labs Review Labs Reviewed - No data to display Imaging Review No results found.  EKG Interpretation   None       MDM   Final diagnoses:  None    No evidence of mastoiditis, skin normal ( don't think its zoster)--no apparent EMC and pt stable for d/c    Toy BakerAnthony T Mesa Janus, MD 01/02/14 [id number]

## 2014-01-02 NOTE — ED Notes (Signed)
Pt reports pain off and on to the skin behind the R ear, redness noted, pt states for two days now. Denies any other S.S.

## 2014-12-26 DIAGNOSIS — H2513 Age-related nuclear cataract, bilateral: Secondary | ICD-10-CM | POA: Diagnosis not present

## 2014-12-26 DIAGNOSIS — H40013 Open angle with borderline findings, low risk, bilateral: Secondary | ICD-10-CM | POA: Diagnosis not present

## 2014-12-30 DIAGNOSIS — I868 Varicose veins of other specified sites: Secondary | ICD-10-CM | POA: Diagnosis not present

## 2014-12-31 DIAGNOSIS — I868 Varicose veins of other specified sites: Secondary | ICD-10-CM | POA: Diagnosis not present

## 2015-01-01 DIAGNOSIS — I868 Varicose veins of other specified sites: Secondary | ICD-10-CM | POA: Diagnosis not present

## 2015-01-02 DIAGNOSIS — I868 Varicose veins of other specified sites: Secondary | ICD-10-CM | POA: Diagnosis not present

## 2015-01-03 DIAGNOSIS — I868 Varicose veins of other specified sites: Secondary | ICD-10-CM | POA: Diagnosis not present

## 2015-01-06 DIAGNOSIS — I868 Varicose veins of other specified sites: Secondary | ICD-10-CM | POA: Diagnosis not present

## 2015-01-07 DIAGNOSIS — I868 Varicose veins of other specified sites: Secondary | ICD-10-CM | POA: Diagnosis not present

## 2015-01-08 DIAGNOSIS — I868 Varicose veins of other specified sites: Secondary | ICD-10-CM | POA: Diagnosis not present

## 2015-01-09 DIAGNOSIS — I868 Varicose veins of other specified sites: Secondary | ICD-10-CM | POA: Diagnosis not present

## 2015-01-10 DIAGNOSIS — I868 Varicose veins of other specified sites: Secondary | ICD-10-CM | POA: Diagnosis not present

## 2015-01-13 DIAGNOSIS — I868 Varicose veins of other specified sites: Secondary | ICD-10-CM | POA: Diagnosis not present

## 2015-01-14 DIAGNOSIS — I868 Varicose veins of other specified sites: Secondary | ICD-10-CM | POA: Diagnosis not present

## 2015-01-15 DIAGNOSIS — I868 Varicose veins of other specified sites: Secondary | ICD-10-CM | POA: Diagnosis not present

## 2015-01-16 DIAGNOSIS — I868 Varicose veins of other specified sites: Secondary | ICD-10-CM | POA: Diagnosis not present

## 2015-01-17 DIAGNOSIS — I868 Varicose veins of other specified sites: Secondary | ICD-10-CM | POA: Diagnosis not present

## 2015-01-20 DIAGNOSIS — I868 Varicose veins of other specified sites: Secondary | ICD-10-CM | POA: Diagnosis not present

## 2015-01-21 DIAGNOSIS — I868 Varicose veins of other specified sites: Secondary | ICD-10-CM | POA: Diagnosis not present

## 2015-01-22 DIAGNOSIS — I868 Varicose veins of other specified sites: Secondary | ICD-10-CM | POA: Diagnosis not present

## 2015-01-23 DIAGNOSIS — I868 Varicose veins of other specified sites: Secondary | ICD-10-CM | POA: Diagnosis not present

## 2015-01-24 DIAGNOSIS — I868 Varicose veins of other specified sites: Secondary | ICD-10-CM | POA: Diagnosis not present

## 2015-01-27 DIAGNOSIS — I868 Varicose veins of other specified sites: Secondary | ICD-10-CM | POA: Diagnosis not present

## 2015-01-28 DIAGNOSIS — I868 Varicose veins of other specified sites: Secondary | ICD-10-CM | POA: Diagnosis not present

## 2015-01-29 DIAGNOSIS — I868 Varicose veins of other specified sites: Secondary | ICD-10-CM | POA: Diagnosis not present

## 2015-01-30 DIAGNOSIS — I868 Varicose veins of other specified sites: Secondary | ICD-10-CM | POA: Diagnosis not present

## 2015-01-31 DIAGNOSIS — I868 Varicose veins of other specified sites: Secondary | ICD-10-CM | POA: Diagnosis not present

## 2015-02-03 DIAGNOSIS — I868 Varicose veins of other specified sites: Secondary | ICD-10-CM | POA: Diagnosis not present

## 2015-02-04 DIAGNOSIS — I868 Varicose veins of other specified sites: Secondary | ICD-10-CM | POA: Diagnosis not present

## 2015-02-05 DIAGNOSIS — I868 Varicose veins of other specified sites: Secondary | ICD-10-CM | POA: Diagnosis not present

## 2015-02-06 DIAGNOSIS — I868 Varicose veins of other specified sites: Secondary | ICD-10-CM | POA: Diagnosis not present

## 2015-02-10 DIAGNOSIS — I868 Varicose veins of other specified sites: Secondary | ICD-10-CM | POA: Diagnosis not present

## 2015-02-11 DIAGNOSIS — I868 Varicose veins of other specified sites: Secondary | ICD-10-CM | POA: Diagnosis not present

## 2015-02-12 DIAGNOSIS — I868 Varicose veins of other specified sites: Secondary | ICD-10-CM | POA: Diagnosis not present

## 2015-02-13 DIAGNOSIS — I868 Varicose veins of other specified sites: Secondary | ICD-10-CM | POA: Diagnosis not present

## 2015-02-14 DIAGNOSIS — I868 Varicose veins of other specified sites: Secondary | ICD-10-CM | POA: Diagnosis not present

## 2015-02-17 DIAGNOSIS — I868 Varicose veins of other specified sites: Secondary | ICD-10-CM | POA: Diagnosis not present

## 2015-02-18 DIAGNOSIS — I868 Varicose veins of other specified sites: Secondary | ICD-10-CM | POA: Diagnosis not present

## 2015-02-19 DIAGNOSIS — I868 Varicose veins of other specified sites: Secondary | ICD-10-CM | POA: Diagnosis not present

## 2015-02-20 DIAGNOSIS — I868 Varicose veins of other specified sites: Secondary | ICD-10-CM | POA: Diagnosis not present

## 2015-02-21 DIAGNOSIS — I868 Varicose veins of other specified sites: Secondary | ICD-10-CM | POA: Diagnosis not present

## 2015-02-24 DIAGNOSIS — I868 Varicose veins of other specified sites: Secondary | ICD-10-CM | POA: Diagnosis not present

## 2015-02-25 DIAGNOSIS — I868 Varicose veins of other specified sites: Secondary | ICD-10-CM | POA: Diagnosis not present

## 2015-02-26 DIAGNOSIS — I868 Varicose veins of other specified sites: Secondary | ICD-10-CM | POA: Diagnosis not present

## 2015-02-27 DIAGNOSIS — I868 Varicose veins of other specified sites: Secondary | ICD-10-CM | POA: Diagnosis not present

## 2015-02-28 DIAGNOSIS — I868 Varicose veins of other specified sites: Secondary | ICD-10-CM | POA: Diagnosis not present

## 2015-03-03 DIAGNOSIS — I868 Varicose veins of other specified sites: Secondary | ICD-10-CM | POA: Diagnosis not present

## 2015-03-04 DIAGNOSIS — I868 Varicose veins of other specified sites: Secondary | ICD-10-CM | POA: Diagnosis not present

## 2015-03-05 DIAGNOSIS — I868 Varicose veins of other specified sites: Secondary | ICD-10-CM | POA: Diagnosis not present

## 2015-03-06 DIAGNOSIS — I868 Varicose veins of other specified sites: Secondary | ICD-10-CM | POA: Diagnosis not present

## 2015-03-07 DIAGNOSIS — I868 Varicose veins of other specified sites: Secondary | ICD-10-CM | POA: Diagnosis not present

## 2015-03-10 DIAGNOSIS — I868 Varicose veins of other specified sites: Secondary | ICD-10-CM | POA: Diagnosis not present

## 2015-03-11 DIAGNOSIS — I868 Varicose veins of other specified sites: Secondary | ICD-10-CM | POA: Diagnosis not present

## 2015-03-12 DIAGNOSIS — I868 Varicose veins of other specified sites: Secondary | ICD-10-CM | POA: Diagnosis not present

## 2015-03-13 DIAGNOSIS — I868 Varicose veins of other specified sites: Secondary | ICD-10-CM | POA: Diagnosis not present

## 2015-03-14 DIAGNOSIS — I868 Varicose veins of other specified sites: Secondary | ICD-10-CM | POA: Diagnosis not present

## 2015-03-17 DIAGNOSIS — I868 Varicose veins of other specified sites: Secondary | ICD-10-CM | POA: Diagnosis not present

## 2015-03-18 DIAGNOSIS — I868 Varicose veins of other specified sites: Secondary | ICD-10-CM | POA: Diagnosis not present

## 2015-03-19 DIAGNOSIS — I868 Varicose veins of other specified sites: Secondary | ICD-10-CM | POA: Diagnosis not present

## 2015-03-20 DIAGNOSIS — I868 Varicose veins of other specified sites: Secondary | ICD-10-CM | POA: Diagnosis not present

## 2015-03-21 DIAGNOSIS — I868 Varicose veins of other specified sites: Secondary | ICD-10-CM | POA: Diagnosis not present

## 2015-03-24 DIAGNOSIS — I868 Varicose veins of other specified sites: Secondary | ICD-10-CM | POA: Diagnosis not present

## 2015-03-25 DIAGNOSIS — I868 Varicose veins of other specified sites: Secondary | ICD-10-CM | POA: Diagnosis not present

## 2015-03-26 DIAGNOSIS — I868 Varicose veins of other specified sites: Secondary | ICD-10-CM | POA: Diagnosis not present

## 2015-03-27 DIAGNOSIS — I868 Varicose veins of other specified sites: Secondary | ICD-10-CM | POA: Diagnosis not present

## 2015-03-28 DIAGNOSIS — I868 Varicose veins of other specified sites: Secondary | ICD-10-CM | POA: Diagnosis not present

## 2015-03-31 DIAGNOSIS — I868 Varicose veins of other specified sites: Secondary | ICD-10-CM | POA: Diagnosis not present

## 2015-04-01 DIAGNOSIS — I868 Varicose veins of other specified sites: Secondary | ICD-10-CM | POA: Diagnosis not present

## 2015-04-02 DIAGNOSIS — I868 Varicose veins of other specified sites: Secondary | ICD-10-CM | POA: Diagnosis not present

## 2015-04-03 DIAGNOSIS — I868 Varicose veins of other specified sites: Secondary | ICD-10-CM | POA: Diagnosis not present

## 2015-04-04 DIAGNOSIS — I868 Varicose veins of other specified sites: Secondary | ICD-10-CM | POA: Diagnosis not present

## 2015-04-07 DIAGNOSIS — I868 Varicose veins of other specified sites: Secondary | ICD-10-CM | POA: Diagnosis not present

## 2015-04-08 DIAGNOSIS — I868 Varicose veins of other specified sites: Secondary | ICD-10-CM | POA: Diagnosis not present

## 2015-04-09 DIAGNOSIS — I868 Varicose veins of other specified sites: Secondary | ICD-10-CM | POA: Diagnosis not present

## 2015-04-10 DIAGNOSIS — I868 Varicose veins of other specified sites: Secondary | ICD-10-CM | POA: Diagnosis not present

## 2015-04-11 DIAGNOSIS — I868 Varicose veins of other specified sites: Secondary | ICD-10-CM | POA: Diagnosis not present

## 2015-04-14 DIAGNOSIS — I868 Varicose veins of other specified sites: Secondary | ICD-10-CM | POA: Diagnosis not present

## 2015-04-15 DIAGNOSIS — I868 Varicose veins of other specified sites: Secondary | ICD-10-CM | POA: Diagnosis not present

## 2015-04-16 DIAGNOSIS — I868 Varicose veins of other specified sites: Secondary | ICD-10-CM | POA: Diagnosis not present

## 2015-04-17 DIAGNOSIS — I868 Varicose veins of other specified sites: Secondary | ICD-10-CM | POA: Diagnosis not present

## 2015-04-18 DIAGNOSIS — I868 Varicose veins of other specified sites: Secondary | ICD-10-CM | POA: Diagnosis not present

## 2015-04-21 DIAGNOSIS — I868 Varicose veins of other specified sites: Secondary | ICD-10-CM | POA: Diagnosis not present

## 2015-04-22 DIAGNOSIS — I868 Varicose veins of other specified sites: Secondary | ICD-10-CM | POA: Diagnosis not present

## 2015-04-23 DIAGNOSIS — I868 Varicose veins of other specified sites: Secondary | ICD-10-CM | POA: Diagnosis not present

## 2015-04-24 DIAGNOSIS — I868 Varicose veins of other specified sites: Secondary | ICD-10-CM | POA: Diagnosis not present

## 2015-04-25 DIAGNOSIS — I868 Varicose veins of other specified sites: Secondary | ICD-10-CM | POA: Diagnosis not present

## 2015-05-09 ENCOUNTER — Other Ambulatory Visit (HOSPITAL_COMMUNITY)
Admission: RE | Admit: 2015-05-09 | Discharge: 2015-05-09 | Disposition: A | Discharge status: Home / Self Care | Payer: Medicaid Other | Source: Ambulatory Visit | Attending: Family Medicine | Admitting: Family Medicine

## 2015-05-09 ENCOUNTER — Other Ambulatory Visit: Payer: Medicaid Other | Admitting: Family Medicine

## 2015-05-09 DIAGNOSIS — I10 Essential (primary) hypertension: Secondary | ICD-10-CM | POA: Diagnosis not present

## 2015-05-09 DIAGNOSIS — F341 Dysthymic disorder: Secondary | ICD-10-CM | POA: Diagnosis not present

## 2015-05-09 DIAGNOSIS — K7290 Hepatic failure, unspecified without coma: Secondary | ICD-10-CM | POA: Diagnosis not present

## 2015-05-09 DIAGNOSIS — R600 Localized edema: Secondary | ICD-10-CM | POA: Diagnosis not present

## 2015-05-09 DIAGNOSIS — N760 Acute vaginitis: Secondary | ICD-10-CM | POA: Insufficient documentation

## 2015-05-09 DIAGNOSIS — Z113 Encounter for screening for infections with a predominantly sexual mode of transmission: Secondary | ICD-10-CM | POA: Diagnosis present

## 2015-05-15 LAB — URINE CYTOLOGY ANCILLARY ONLY
Candida vaginitis: NEGATIVE
Chlamydia: NEGATIVE
Neisseria Gonorrhea: NEGATIVE
TRICH (WINDOWPATH): POSITIVE — AB

## 2015-08-25 DIAGNOSIS — N3281 Overactive bladder: Secondary | ICD-10-CM | POA: Diagnosis not present

## 2015-08-25 DIAGNOSIS — I10 Essential (primary) hypertension: Secondary | ICD-10-CM | POA: Diagnosis not present

## 2015-08-25 DIAGNOSIS — F341 Dysthymic disorder: Secondary | ICD-10-CM | POA: Diagnosis not present

## 2015-08-25 DIAGNOSIS — J209 Acute bronchitis, unspecified: Secondary | ICD-10-CM | POA: Diagnosis not present

## 2015-12-06 ENCOUNTER — Emergency Department (HOSPITAL_COMMUNITY)
Admission: EM | Admit: 2015-12-06 | Discharge: 2015-12-06 | Disposition: A | Discharge status: Home / Self Care | Payer: Medicare Other | Attending: Emergency Medicine | Admitting: Emergency Medicine

## 2015-12-06 ENCOUNTER — Encounter (HOSPITAL_COMMUNITY): Payer: Self-pay | Admitting: Emergency Medicine

## 2015-12-06 ENCOUNTER — Emergency Department (HOSPITAL_COMMUNITY): Payer: Medicare Other

## 2015-12-06 DIAGNOSIS — T148 Other injury of unspecified body region: Secondary | ICD-10-CM | POA: Diagnosis not present

## 2015-12-06 DIAGNOSIS — S82842A Displaced bimalleolar fracture of left lower leg, initial encounter for closed fracture: Secondary | ICD-10-CM | POA: Diagnosis not present

## 2015-12-06 DIAGNOSIS — Z79899 Other long term (current) drug therapy: Secondary | ICD-10-CM | POA: Diagnosis not present

## 2015-12-06 DIAGNOSIS — Z791 Long term (current) use of non-steroidal anti-inflammatories (NSAID): Secondary | ICD-10-CM | POA: Diagnosis not present

## 2015-12-06 DIAGNOSIS — M9722XA Periprosthetic fracture around internal prosthetic left ankle joint, initial encounter: Secondary | ICD-10-CM | POA: Diagnosis not present

## 2015-12-06 DIAGNOSIS — M1990 Unspecified osteoarthritis, unspecified site: Secondary | ICD-10-CM | POA: Diagnosis not present

## 2015-12-06 DIAGNOSIS — M25579 Pain in unspecified ankle and joints of unspecified foot: Secondary | ICD-10-CM | POA: Diagnosis not present

## 2015-12-06 DIAGNOSIS — I10 Essential (primary) hypertension: Secondary | ICD-10-CM | POA: Diagnosis not present

## 2015-12-06 DIAGNOSIS — Z8673 Personal history of transient ischemic attack (TIA), and cerebral infarction without residual deficits: Secondary | ICD-10-CM | POA: Diagnosis not present

## 2015-12-06 DIAGNOSIS — W108XXA Fall (on) (from) other stairs and steps, initial encounter: Secondary | ICD-10-CM | POA: Diagnosis not present

## 2015-12-06 DIAGNOSIS — S99912A Unspecified injury of left ankle, initial encounter: Secondary | ICD-10-CM | POA: Diagnosis present

## 2015-12-06 DIAGNOSIS — Y998 Other external cause status: Secondary | ICD-10-CM | POA: Diagnosis not present

## 2015-12-06 DIAGNOSIS — Y9289 Other specified places as the place of occurrence of the external cause: Secondary | ICD-10-CM | POA: Diagnosis not present

## 2015-12-06 DIAGNOSIS — Y9301 Activity, walking, marching and hiking: Secondary | ICD-10-CM | POA: Diagnosis not present

## 2015-12-06 DIAGNOSIS — F17200 Nicotine dependence, unspecified, uncomplicated: Secondary | ICD-10-CM | POA: Insufficient documentation

## 2015-12-06 LAB — CBC WITH DIFFERENTIAL/PLATELET
BASOS ABS: 0 10*3/uL (ref 0.0–0.1)
Basophils Relative: 0 %
EOS PCT: 3 %
Eosinophils Absolute: 0.2 10*3/uL (ref 0.0–0.7)
HEMATOCRIT: 36.7 % (ref 36.0–46.0)
HEMOGLOBIN: 12.3 g/dL (ref 12.0–15.0)
LYMPHS ABS: 2.7 10*3/uL (ref 0.7–4.0)
LYMPHS PCT: 35 %
MCH: 30.7 pg (ref 26.0–34.0)
MCHC: 33.5 g/dL (ref 30.0–36.0)
MCV: 91.5 fL (ref 78.0–100.0)
Monocytes Absolute: 0.5 10*3/uL (ref 0.1–1.0)
Monocytes Relative: 6 %
NEUTROS ABS: 4.3 10*3/uL (ref 1.7–7.7)
NEUTROS PCT: 56 %
PLATELETS: 271 10*3/uL (ref 150–400)
RBC: 4.01 MIL/uL (ref 3.87–5.11)
RDW: 14.5 % (ref 11.5–15.5)
WBC: 7.7 10*3/uL (ref 4.0–10.5)

## 2015-12-06 LAB — BASIC METABOLIC PANEL
ANION GAP: 12 (ref 5–15)
BUN: 12 mg/dL (ref 6–20)
CALCIUM: 9.6 mg/dL (ref 8.9–10.3)
CO2: 25 mmol/L (ref 22–32)
Chloride: 109 mmol/L (ref 101–111)
Creatinine, Ser: 0.8 mg/dL (ref 0.44–1.00)
GFR calc Af Amer: >60 mL/min (ref >60)
GLUCOSE: 123 mg/dL — AB (ref 65–99)
Potassium: 4.7 mmol/L (ref 3.5–5.1)
Sodium: 146 mmol/L — ABNORMAL HIGH (ref 135–145)

## 2015-12-06 LAB — PROTIME-INR
INR: 1.08 (ref 0.00–1.49)
PROTHROMBIN TIME: 14.2 s (ref 11.6–15.2)

## 2015-12-06 MED ORDER — HYDROCODONE-ACETAMINOPHEN 5-325 MG PO TABS: 1.0000 | ORAL_TABLET | ORAL | Status: DC | PRN

## 2015-12-06 MED ORDER — MORPHINE SULFATE (PF) 2 MG/ML IV SOLN
2.0000 mg | Freq: Once | INTRAVENOUS | Status: AC
  Administered 2015-12-06: 2 mg via INTRAVENOUS
  Filled 2015-12-06: qty 1

## 2015-12-06 MED ORDER — WHEEL CHAIR K1 BASIC DESK ARM MISC: 1.0000 | Status: DC | PRN

## 2015-12-06 MED ORDER — ONDANSETRON HCL 4 MG/2ML IJ SOLN
4.0000 mg | Freq: Once | INTRAMUSCULAR | Status: AC
  Administered 2015-12-06: 4 mg via INTRAVENOUS
  Filled 2015-12-06: qty 2

## 2015-12-06 NOTE — ED Notes (Signed)
Pt in EMS after missing step and landing on L ankle. Some edema noted, no obvious deformity. PVA intact. Pt A/OX4, denies passing out or hitting head

## 2015-12-06 NOTE — ED Notes (Signed)
[facility] called  

## 2015-12-06 NOTE — ED Provider Notes (Signed)
CSN: [id number]     Arrival date & time 12/06/15  0102 History  By signing my name below, I, Jamie Haas, attest that this documentation has been prepared under the direction and in the presence of Gilda Crease, MD . Electronically Signed: Linna Haas, Scribe. 12/06/2015. 1:17 AM.     Chief Complaint  Patient presents with  . Ankle Pain  . Fall      HPI   HPI Comments: Jamie Haas brought in by EMS is a 70 y.o. female with h/o HTN and stroke who presents to the Emergency Department complaining of sudden onset left ankle pain occurring a few hours ago. Pt was walking out of her granddaughter's door and fell down multiple steps. Pt states that she did not hit her head or lose consciousness. Pt now complains of constant, ongoing pain to the left ankle. The pain is made worse with movement. No alleviating factors. No interventions given en route to department. She denies any other pain. She denies fever or chills. .     Past Medical History  Diagnosis Date  . Hypertension   . Aneurysm (HCC)   . Stroke [facility]) 2011  . Arthritis    Past Surgical History  Procedure Laterality Date  . Total knee arthroplasty    . Brain surgery  2011    coils inserted  . Vulvar lesion removal  03/17/2012    Procedure: VULVAR LESION;  Surgeon: Brock Bad, MD;  Location: [facility];  Service: Gynecology;  Laterality: N/A;  CO2 Laser Vaporization Of Condyloma   No family history on file. Social History  Substance Use Topics  . Smoking status: Current Every Day Smoker -- 1.50 packs/day  . Smokeless tobacco: None  . Alcohol Use: No   OB History    No data available     Review of Systems  Constitutional: Negative for fever and chills.  Musculoskeletal: Positive for arthralgias (left ankle).  Neurological: Negative for syncope.  All other systems reviewed and are negative.     Allergies  Review of patient's allergies indicates no known allergies.  Home Medications    Prior to Admission medications   Medication Sig Start Date End Date Taking? Authorizing Provider  celecoxib (CELEBREX) 200 MG capsule Take 200 mg by mouth daily.   Yes Historical Provider, MD  montelukast (SINGULAIR) 10 MG tablet Take 10 mg by mouth daily.    Yes Historical Provider, MD  MYRBETRIQ 25 MG TB24 tablet Take 25 mg by mouth daily. 10/14/15  Yes Historical Provider, MD  valsartan-hydrochlorothiazide (DIOVAN-HCT) 160-12.5 MG per tablet Take 1 tablet by mouth daily.   Yes Historical Provider, MD  HYDROcodone-acetaminophen (NORCO/VICODIN) 5-325 MG tablet Take 1 tablet by mouth every 4 (four) hours as needed for moderate pain. 12/06/15   Gilda Crease, MD  Misc. Devices (WHEEL CHAIR K1 BASIC DESK ARM) MISC 1 Device by Does not apply route as needed. 12/06/15   Gilda Crease, MD   BP 133/78 mmHg  Pulse 98  Temp(Src) 97.6 F (36.4 C) (Oral)  Resp 15  Ht  (1.651 m)  Wt 180 lb (81.647 kg)  BMI 29.95 kg/m2  SpO2 95% Physical Exam  Constitutional: She is oriented to person, place, and time. She appears well-developed and well-nourished. No distress.  HENT:  Head: Normocephalic and atraumatic.  Right Ear: Hearing normal.  Left Ear: Hearing normal.  Nose: Nose normal.  Mouth/Throat: Oropharynx is clear and moist and mucous membranes are normal.  Eyes: Conjunctivae and  EOM are normal. Pupils are equal, round, and reactive to light.  Neck: Normal range of motion. Neck supple.  Cardiovascular: Regular rhythm, S1 normal and S2 normal.  Exam reveals no gallop and no friction rub.   No murmur heard. Pulmonary/Chest: Effort normal and breath sounds normal. No respiratory distress. She exhibits no tenderness.  Abdominal: Soft. Normal appearance and bowel sounds are normal. There is no hepatosplenomegaly. There is no tenderness. There is no rebound, no guarding, no tenderness at McBurney's point and negative Murphy's sign. No hernia.  Musculoskeletal: Normal range of  motion. She exhibits edema and tenderness.  Lateral displacement of left foot Swelling of left ankle   Neurological: She is alert and oriented to person, place, and time. She has normal strength. No cranial nerve deficit or sensory deficit. Coordination normal. GCS eye subscore is 4. GCS verbal subscore is 5. GCS motor subscore is 6.  Neurovascularly intact  Skin: Skin is warm, dry and intact. No rash noted. No cyanosis.  Psychiatric: She has a normal mood and affect. Her speech is normal and behavior is normal. Thought content normal.  Nursing note and vitals reviewed.   ED Course  Procedures (including critical care time)  DIAGNOSTIC STUDIES: Oxygen Saturation is 99% on RA, normal by my interpretation.    COORDINATION OF CARE:  1:18 AM Will order ankle x-ray. Discussed treatment plan with pt at bedside and pt agreed to plan.   Labs Review Labs Reviewed  BASIC METABOLIC PANEL - Abnormal; Notable for the following:    Sodium 146 (*)    Glucose, Bld 123 (*)    All other components within normal limits  CBC WITH DIFFERENTIAL/PLATELET  PROTIME-INR    Imaging Review Dg Ankle Complete Left  12/06/2015  CLINICAL DATA:  Patient missed a step and fell today. Left ankle edema. EXAM: LEFT ANKLE COMPLETE - 3+ VIEW COMPARISON:  None. FINDINGS: Acute transverse fracture of the medial malleolus of the left ankle extending to the articular surface. There is lateral displacement of the distal fracture fragment. Widening of the medial tibiotalar joint. Oblique comminuted fracture of the distal left fibula with extension to the tibia fibular joint. There is lateral angulation of the distal fracture fragment. Lateral displacement of the talus with respect to the tibia. Narrowing of the lateral tibiotalar joint space. No definite posterior malleolar fracture. No focal bone lesion. Diffuse soft tissue swelling about the left ankle. IMPRESSION: Acute fractures of the medial and lateral malleolus of the  left ankle with lateral displacement of the distal fracture fragments. Electronically Signed   By: Burman Nieves M.D.   On: 12/06/2015 02:04   Dg Foot Complete Left  12/06/2015  CLINICAL DATA:  Patient missed a step and fell today. Edema to the left ankle. EXAM: LEFT FOOT - COMPLETE 3+ VIEW COMPARISON:  None. FINDINGS: Diffuse bone demineralization. Fractures are demonstrated in the distal left tibia and fibula. See additional report of left ankle. No additional fractures demonstrated in the tarsal, metatarsal, or phalangeal bones of the left foot. No focal bone lesion or bone destruction. Soft tissues swelling over the left ankle. IMPRESSION: Fractures of the medial and lateral malleolus of the left ankle better visualized on previous ankle radiographs. No additional fractures demonstrated in the left foot. Electronically Signed   By: Burman Nieves M.D.   On: 12/06/2015 02:05   I have personally reviewed and evaluated these images and lab results as part of my medical decision-making.   EKG Interpretation None  MDM   Final diagnoses:  Bimalleolar ankle fracture, left, closed, initial encounter    Presents to emergency for evaluation of left ankle injury. Patient tripped going down steps and twisted and injured her ankle. There is no other injury noted on examination. She did have obvious deformity at arrival. X-ray shows bimalleolar fracture, closed. She is neurovascularly intact.  She was placed in a splint. She will be nonweightbearing. She has had knee surgery by Dr. Jacklynn Ganong, orthopedics. I will refer her back to him. I am, however, unfamiliar with him and his practice. I therefore also provided her with information for the on-call orthopedic physician to call, in the event that Dr. Montez Morita cannot see her.  Patient is staying with her daughter. I did make contact by phone with case management to arrange for outpatient services. She was provided Vicodin for analgesia. She was  given a prescription for wheelchair, case management can arrange for PT, OT, wheelchair, etc. She does have a walker to help her get around her home until then.  I personally performed the services described in this documentation, which was scribed in my presence. The recorded information has been reviewed and is accurate.      Gilda Crease, MD 12/06/15 [phone]

## 2015-12-08 ENCOUNTER — Telehealth: Payer: Self-pay | Admitting: *Deleted

## 2015-12-08 NOTE — Telephone Encounter (Signed)
Call from [facility] Meghan that pt needs to be set up for [facility].

## 2015-12-21 ENCOUNTER — Emergency Department (HOSPITAL_COMMUNITY): Payer: Medicare Other

## 2015-12-21 ENCOUNTER — Encounter (HOSPITAL_COMMUNITY): Payer: Self-pay | Admitting: Emergency Medicine

## 2015-12-21 ENCOUNTER — Inpatient Hospital Stay (HOSPITAL_COMMUNITY)
Admission: EM | Admit: 2015-12-21 | Discharge: 2015-12-25 | DRG: 493 | Disposition: A | Discharge status: Skilled Nursing Facility | Payer: Medicare Other | Attending: Internal Medicine | Admitting: Internal Medicine

## 2015-12-21 DIAGNOSIS — Z8249 Family history of ischemic heart disease and other diseases of the circulatory system: Secondary | ICD-10-CM | POA: Diagnosis not present

## 2015-12-21 DIAGNOSIS — Z9889 Other specified postprocedural states: Secondary | ICD-10-CM | POA: Diagnosis not present

## 2015-12-21 DIAGNOSIS — Z9181 History of falling: Secondary | ICD-10-CM | POA: Diagnosis not present

## 2015-12-21 DIAGNOSIS — W108XXD Fall (on) (from) other stairs and steps, subsequent encounter: Secondary | ICD-10-CM | POA: Diagnosis not present

## 2015-12-21 DIAGNOSIS — M1990 Unspecified osteoarthritis, unspecified site: Secondary | ICD-10-CM | POA: Diagnosis not present

## 2015-12-21 DIAGNOSIS — G8918 Other acute postprocedural pain: Secondary | ICD-10-CM | POA: Diagnosis not present

## 2015-12-21 DIAGNOSIS — S82842A Displaced bimalleolar fracture of left lower leg, initial encounter for closed fracture: Secondary | ICD-10-CM | POA: Diagnosis not present

## 2015-12-21 DIAGNOSIS — D62 Acute posthemorrhagic anemia: Secondary | ICD-10-CM | POA: Diagnosis not present

## 2015-12-21 DIAGNOSIS — Z96652 Presence of left artificial knee joint: Secondary | ICD-10-CM | POA: Diagnosis not present

## 2015-12-21 DIAGNOSIS — S82842G Displaced bimalleolar fracture of left lower leg, subsequent encounter for closed fracture with delayed healing: Secondary | ICD-10-CM | POA: Diagnosis not present

## 2015-12-21 DIAGNOSIS — I639 Cerebral infarction, unspecified: Secondary | ICD-10-CM | POA: Diagnosis not present

## 2015-12-21 DIAGNOSIS — N3281 Overactive bladder: Secondary | ICD-10-CM | POA: Diagnosis not present

## 2015-12-21 DIAGNOSIS — M6281 Muscle weakness (generalized): Secondary | ICD-10-CM | POA: Diagnosis not present

## 2015-12-21 DIAGNOSIS — S92909A Unspecified fracture of unspecified foot, initial encounter for closed fracture: Secondary | ICD-10-CM | POA: Diagnosis not present

## 2015-12-21 DIAGNOSIS — I10 Essential (primary) hypertension: Secondary | ICD-10-CM | POA: Diagnosis not present

## 2015-12-21 DIAGNOSIS — S82842D Displaced bimalleolar fracture of left lower leg, subsequent encounter for closed fracture with routine healing: Secondary | ICD-10-CM | POA: Diagnosis not present

## 2015-12-21 DIAGNOSIS — Z79899 Other long term (current) drug therapy: Secondary | ICD-10-CM | POA: Diagnosis not present

## 2015-12-21 DIAGNOSIS — S93432A Sprain of tibiofibular ligament of left ankle, initial encounter: Secondary | ICD-10-CM | POA: Diagnosis not present

## 2015-12-21 DIAGNOSIS — I69354 Hemiplegia and hemiparesis following cerebral infarction affecting left non-dominant side: Secondary | ICD-10-CM | POA: Diagnosis not present

## 2015-12-21 DIAGNOSIS — S82852A Displaced trimalleolar fracture of left lower leg, initial encounter for closed fracture: Secondary | ICD-10-CM | POA: Diagnosis not present

## 2015-12-21 DIAGNOSIS — S82843S Displaced bimalleolar fracture of unspecified lower leg, sequela: Secondary | ICD-10-CM | POA: Diagnosis not present

## 2015-12-21 DIAGNOSIS — Z419 Encounter for procedure for purposes other than remedying health state, unspecified: Secondary | ICD-10-CM

## 2015-12-21 DIAGNOSIS — N393 Stress incontinence (female) (male): Secondary | ICD-10-CM | POA: Diagnosis not present

## 2015-12-21 DIAGNOSIS — S82843A Displaced bimalleolar fracture of unspecified lower leg, initial encounter for closed fracture: Secondary | ICD-10-CM | POA: Insufficient documentation

## 2015-12-21 DIAGNOSIS — S82853A Displaced trimalleolar fracture of unspecified lower leg, initial encounter for closed fracture: Secondary | ICD-10-CM | POA: Diagnosis present

## 2015-12-21 DIAGNOSIS — L8990 Pressure ulcer of unspecified site, unspecified stage: Secondary | ICD-10-CM | POA: Insufficient documentation

## 2015-12-21 DIAGNOSIS — S82899A Other fracture of unspecified lower leg, initial encounter for closed fracture: Secondary | ICD-10-CM | POA: Diagnosis present

## 2015-12-21 DIAGNOSIS — M170 Bilateral primary osteoarthritis of knee: Secondary | ICD-10-CM | POA: Insufficient documentation

## 2015-12-21 DIAGNOSIS — F17210 Nicotine dependence, cigarettes, uncomplicated: Secondary | ICD-10-CM | POA: Diagnosis present

## 2015-12-21 LAB — CBC WITH DIFFERENTIAL/PLATELET
Basophils Absolute: 0 10*3/uL (ref 0.0–0.1)
Basophils Relative: 1 %
EOS ABS: 0.3 10*3/uL (ref 0.0–0.7)
EOS PCT: 4 %
HCT: 34.2 % — ABNORMAL LOW (ref 36.0–46.0)
Hemoglobin: 11.4 g/dL — ABNORMAL LOW (ref 12.0–15.0)
LYMPHS ABS: 2.4 10*3/uL (ref 0.7–4.0)
Lymphocytes Relative: 34 %
MCH: 31 pg (ref 26.0–34.0)
MCHC: 33.3 g/dL (ref 30.0–36.0)
MCV: 92.9 fL (ref 78.0–100.0)
MONOS PCT: 9 %
Monocytes Absolute: 0.6 10*3/uL (ref 0.1–1.0)
Neutro Abs: 3.7 10*3/uL (ref 1.7–7.7)
Neutrophils Relative %: 52 %
PLATELETS: 345 10*3/uL (ref 150–400)
RBC: 3.68 MIL/uL — ABNORMAL LOW (ref 3.87–5.11)
RDW: 14.8 % (ref 11.5–15.5)
WBC: 7 10*3/uL (ref 4.0–10.5)

## 2015-12-21 LAB — URINALYSIS, ROUTINE W REFLEX MICROSCOPIC
BILIRUBIN URINE: NEGATIVE
GLUCOSE, UA: NEGATIVE mg/dL
KETONES UR: NEGATIVE mg/dL
Nitrite: NEGATIVE
PH: 6 (ref 5.0–8.0)
Protein, ur: NEGATIVE mg/dL
Specific Gravity, Urine: 1.022 (ref 1.005–1.030)

## 2015-12-21 LAB — BASIC METABOLIC PANEL
Anion gap: 11 (ref 5–15)
BUN: 15 mg/dL (ref 6–20)
CO2: 26 mmol/L (ref 22–32)
CREATININE: 0.73 mg/dL (ref 0.44–1.00)
Calcium: 9.5 mg/dL (ref 8.9–10.3)
Chloride: 108 mmol/L (ref 101–111)
GFR calc Af Amer: >60 mL/min (ref >60)
Glucose, Bld: 101 mg/dL — ABNORMAL HIGH (ref 65–99)
Potassium: 4.9 mmol/L (ref 3.5–5.1)
SODIUM: 145 mmol/L (ref 135–145)

## 2015-12-21 LAB — PROTIME-INR
INR: 1.18 (ref 0.00–1.49)
PROTHROMBIN TIME: 15.2 s (ref 11.6–15.2)

## 2015-12-21 LAB — URINE MICROSCOPIC-ADD ON: Bacteria, UA: NONE SEEN

## 2015-12-21 MED ORDER — FAMOTIDINE IN NACL 20-0.9 MG/50ML-% IV SOLN: 20.0000 mg | Freq: Once | INTRAVENOUS | Status: DC

## 2015-12-21 MED ORDER — POLYETHYLENE GLYCOL 3350 17 G PO PACK
17.0000 g | PACK | Freq: Every day | ORAL | Status: DC | PRN
  Administered 2015-12-23: 17 g via ORAL
  Filled 2015-12-21: qty 1

## 2015-12-21 MED ORDER — HYDROCERIN EX CREA
TOPICAL_CREAM | Freq: Two times a day (BID) | CUTANEOUS | Status: DC
  Administered 2015-12-21: 23:00:00 via TOPICAL
  Administered 2015-12-21: 1 via TOPICAL
  Administered 2015-12-22 – 2015-12-24 (×5): via TOPICAL
  Filled 2015-12-21: qty 113

## 2015-12-21 MED ORDER — ONDANSETRON HCL 4 MG PO TABS: 4.0000 mg | ORAL_TABLET | Freq: Four times a day (QID) | ORAL | Status: DC | PRN

## 2015-12-21 MED ORDER — ONDANSETRON HCL 4 MG/2ML IJ SOLN: 4.0000 mg | Freq: Four times a day (QID) | INTRAMUSCULAR | Status: DC | PRN

## 2015-12-21 MED ORDER — HYDROCODONE-ACETAMINOPHEN 5-325 MG PO TABS
1.0000 | ORAL_TABLET | ORAL | Status: DC | PRN
Start: 2015-12-21 — End: 2015-12-22
  Administered 2015-12-21 (×2): 1 via ORAL
  Filled 2015-12-21 (×2): qty 1

## 2015-12-21 MED ORDER — DEXTROSE 5 % IV SOLN
2.0000 g | Freq: Once | INTRAVENOUS | Status: DC
  Filled 2015-12-21 (×2): qty 20

## 2015-12-21 MED ORDER — MIRABEGRON ER 25 MG PO TB24
25.0000 mg | ORAL_TABLET | Freq: Every day | ORAL | Status: DC
  Administered 2015-12-21 – 2015-12-25 (×4): 25 mg via ORAL
  Filled 2015-12-21 (×5): qty 1

## 2015-12-21 MED ORDER — HYDROCODONE-ACETAMINOPHEN 5-325 MG PO TABS
2.0000 | ORAL_TABLET | Freq: Once | ORAL | Status: AC
  Administered 2015-12-21: 2 via ORAL
  Filled 2015-12-21: qty 2

## 2015-12-21 MED ORDER — MONTELUKAST SODIUM 10 MG PO TABS
10.0000 mg | ORAL_TABLET | Freq: Every day | ORAL | Status: DC
  Administered 2015-12-22 – 2015-12-25 (×4): 10 mg via ORAL
  Filled 2015-12-21 (×4): qty 1

## 2015-12-21 MED ORDER — ACETAMINOPHEN 500 MG PO TABS
1000.0000 mg | ORAL_TABLET | Freq: Once | ORAL | Status: DC
  Filled 2015-12-21: qty 2

## 2015-12-21 MED ORDER — NICOTINE 14 MG/24HR TD PT24
14.0000 mg | MEDICATED_PATCH | Freq: Every day | TRANSDERMAL | Status: DC
  Administered 2015-12-21 – 2015-12-25 (×5): 14 mg via TRANSDERMAL
  Filled 2015-12-21 (×5): qty 1

## 2015-12-21 MED ORDER — HEPARIN SODIUM (PORCINE) 5000 UNIT/ML IJ SOLN
5000.0000 [IU] | Freq: Three times a day (TID) | INTRAMUSCULAR | Status: AC
  Administered 2015-12-21 – 2015-12-23 (×5): 5000 [IU] via SUBCUTANEOUS
  Filled 2015-12-21 (×5): qty 1

## 2015-12-21 MED ORDER — DICLOFENAC SODIUM 1 % TD GEL
2.0000 g | Freq: Four times a day (QID) | TRANSDERMAL | Status: DC
  Administered 2015-12-21 – 2015-12-25 (×13): 2 g via TOPICAL
  Filled 2015-12-21: qty 100

## 2015-12-21 NOTE — H&P (Signed)
Date: 12/21/2015               Patient Name:  Jamie Haas MRN: [id number]  DOB: [birth year] Age / Sex: 70 y.o., female   PCP: Jamie Lei, MD         Medical Service: Internal Medicine Teaching Service         Attending Physician: Dr. Oval Linsey, MD    First Contact: Dr. Burgess Haas Pager: [phone]  Second Contact: Dr. Osa Haas  Pager: [phone]       After Hours (After 5p/  First Contact Pager: [phone]  weekends / holidays): Second Contact Pager: [phone]   Chief Complaint: left ankle fracture   History of Present Illness:   Jamie Haas is a 70 yo with history of HTN, CVA in 2011, total knee arthroplasty of left one in 2011, who is here after the ER visit on Jan 28th. She had come in the ER on 1/28 after falling down. She was at her grand daughter's house and was walking out of it and missed a step and as a result had twisted her ankle and fell. She did not hit the head and no LOC. Since then, she was having constant pain in left ankle. The xray showed bi-malleolar fracture and put in splint and ER offered her orthopedic consult but the daughter said she wanted to follow up with her orthopedic surgeon Dr Jamie Haas who operated on her knee, but was not able to as one, he retired and two, she was not able to get out of the house at all.  Patient continued to have 4/10 nonradiating throbbing pain since then and no change in that but today, the patient wanted a resolution of her symptoms so they brought her here.  She says she takes the Norco, and it brings her pain down to 3 from a 7. She takes it q4 hours.  She also has some throbbing of right knee.  She denies fevers, chills, n/v/d/c, cp or sob. Prior to the fracture, she was able to walk a flight of stairs without getting short of breath.   PMH: stroke with residual left sided weakness, HTN on Diovan, stress incontinence,  FH: HTn, cancer in mother and arthritis in father SH: smokes 0.75 ppd for many years 10+ years, no  alcohol or illicit drug ue. Last drink > 1 month ago.         She lives by herself in an apartment but daughter comes to visit her.  Her PCP is Dr Jamie Haas in [place]: No current facility-administered medications for this encounter.   Current Outpatient Prescriptions  Medication Sig Dispense Refill  . HYDROcodone-acetaminophen (NORCO/VICODIN) 5-325 MG tablet Take 1 tablet by mouth every 4 (four) hours as needed for moderate pain. 20 tablet 0  . ibuprofen (ADVIL,MOTRIN) 200 MG tablet Take 400 mg by mouth 2 (two) times daily as needed for moderate pain.    . montelukast (SINGULAIR) 10 MG tablet Take 10 mg by mouth daily.     Marland Kitchen MYRBETRIQ 25 MG TB24 tablet Take 25 mg by mouth daily.  0  . celecoxib (CELEBREX) 200 MG capsule Take 200 mg by mouth daily.    . Misc. Devices (WHEEL CHAIR K1 BASIC DESK ARM) MISC 1 Device by Does not apply route as needed. 1 each 0  . valsartan-hydrochlorothiazide (DIOVAN-HCT) 160-12.5 MG per tablet Take 1 tablet by mouth daily.      Allergies: Allergies as of 12/21/2015  . (No Known  Allergies)   Past Medical History  Diagnosis Date  . Hypertension   . Aneurysm ([place])   . Stroke [facility]) 2011  . Arthritis    Past Surgical History  Procedure Laterality Date  . Total knee arthroplasty    . Brain surgery  2011    coils inserted  . Vulvar lesion removal  03/17/2012    Procedure: VULVAR LESION;  Surgeon: Jamie Bombard, MD;  Location: [facility];  Service: Gynecology;  Laterality: N/A;  CO2 Laser Vaporization Of Condyloma   Family History  Problem Relation Age of Onset  . Hypertension Mother   . Osteoarthritis Mother   . Cancer Mother    Social History   Social History  . Marital Status: Legally Separated    Spouse Name: N/A  . Number of Children: N/A  . Years of Education: N/A   Occupational History  . Not on file.   Social History Main Topics  . Smoking status: Current Every Day Smoker -- 1.50 packs/day  . Smokeless tobacco: Not on file   . Alcohol Use: No  . Drug Use: No  . Sexual Activity: Not on file   Other Topics Concern  . Not on file   Social History Narrative    Review of Systems: Pertinent items noted in HPI and remainder of comprehensive ROS otherwise negative.  Physical Exam: Blood pressure 93/78, pulse 73, temperature 98.8 F (37.1 C), temperature source Oral, resp. rate 22, SpO2 97 %.  General: Vital signs reviewed. Patient in tears and in bed, daughter at bedside  Cardiovascular: regular rate, rhythm, no murmur appreciated  Pulmonary/Chest: Clear to auscultation bilaterally, no wheezes, or crackles  Abdominal: Soft, non-tender, non-distended, BS + Extremities:  Left ankle tender to palpation. Pulses intact bilaterally. Both knees have significant effusion but no tenderness or erythema , no pitting edema b/l Neuro: residual left UE weakness from CVA, right hand strength 5/5. LE can raise legs without support   Lab results: Results for orders placed or performed during the hospital encounter of 12/21/15 (from the past 24 hour(s))  CBC with Differential/Platelet     Status: Abnormal   Collection Time: 12/21/15 11:39 AM  Result Value Ref Range   WBC 7.0 4.0 - 10.5 K/uL   RBC 3.68 (L) 3.87 - 5.11 MIL/uL   Hemoglobin 11.4 (L) 12.0 - 15.0 g/dL   HCT 34.2 (L) 36.0 - 46.0 %   MCV 92.9 78.0 - 100.0 fL   MCH 31.0 26.0 - 34.0 pg   MCHC 33.3 30.0 - 36.0 g/dL   RDW 14.8 11.5 - 15.5 %   Platelets 345 150 - 400 K/uL   Neutrophils Relative % 52 %   Neutro Abs 3.7 1.7 - 7.7 K/uL   Lymphocytes Relative 34 %   Lymphs Abs 2.4 0.7 - 4.0 K/uL   Monocytes Relative 9 %   Monocytes Absolute 0.6 0.1 - 1.0 K/uL   Eosinophils Relative 4 %   Eosinophils Absolute 0.3 0.0 - 0.7 K/uL   Basophils Relative 1 %   Basophils Absolute 0.0 0.0 - 0.1 K/uL  Basic metabolic panel     Status: Abnormal   Collection Time: 12/21/15 11:39 AM  Result Value Ref Range   Sodium 145 135 - 145 mmol/L   Potassium 4.9 3.5 - 5.1 mmol/L     Chloride 108 101 - 111 mmol/L   CO2 26 22 - 32 mmol/L   Glucose, Bld 101 (H) 65 - 99 mg/dL   BUN 15 6 - 20  mg/dL   Creatinine, Ser 0.73 0.44 - 1.00 mg/dL   Calcium 9.5 8.9 - 10.3 mg/dL   GFR calc non Af Amer >60 >60 mL/min   GFR calc Af Amer >60 >60 mL/min   Anion gap 11 5 - 15  Protime-INR     Status: None   Collection Time: 12/21/15 11:39 AM  Result Value Ref Range   Prothrombin Time 15.2 11.6 - 15.2 seconds   INR 1.18 0.00 - 1.49  Urinalysis, Routine w reflex microscopic (not at [facility])     Status: Abnormal   Collection Time: 12/21/15  2:06 PM  Result Value Ref Range   Color, Urine YELLOW YELLOW   APPearance CLOUDY (A) CLEAR   Specific Gravity, Urine 1.022 1.005 - 1.030   pH 6.0 5.0 - 8.0   Glucose, UA NEGATIVE NEGATIVE mg/dL   Hgb urine dipstick SMALL (A) NEGATIVE   Bilirubin Urine NEGATIVE NEGATIVE   Ketones, ur NEGATIVE NEGATIVE mg/dL   Protein, ur NEGATIVE NEGATIVE mg/dL   Nitrite NEGATIVE NEGATIVE   Leukocytes, UA SMALL (A) NEGATIVE  Urine microscopic-add on     Status: Abnormal   Collection Time: 12/21/15  2:06 PM  Result Value Ref Range   Squamous Epithelial / LPF 6-30 (A) NONE SEEN   WBC, UA 0-5 0 - 5 WBC/hpf   RBC / HPF 0-5 0 - 5 RBC/hpf   Bacteria, UA NONE SEEN NONE SEEN    Imaging results:  Dg Ankle Complete Left  12/21/2015  CLINICAL DATA:  Fall on 12/06/2015, ankle fracture EXAM: LEFT ANKLE COMPLETE - 3+ VIEW COMPARISON:  12/06/2015 FINDINGS: Comminuted distal fibular fracture, with mild apex medial angulation. Medial malleolar fracture with mild lateral displacement of the distal fracture fragment. Possible mild widening of the distal tibiofibular syndesmosis. Ankle mortise is notable for lateral talar shift. Associated soft tissue swelling. Overall, these findings are essentially unchanged from the prior study. IMPRESSION: Comminuted distal fibular fracture, as above. Mildly displaced medial malleolar fracture. Lateral talar shift. Overall, these findings are  essentially unchanged from the prior. Electronically Signed   By: Julian Hy M.D.   On: 12/21/2015 10:43   Dg Chest Port 1 View  12/21/2015  CLINICAL DATA:  Fall, broken ankle.  Preoperative evaluation. EXAM: PORTABLE CHEST 1 VIEW COMPARISON:  Chest x-ray dated 06/21/2012. FINDINGS: Heart size is upper normal, unchanged. Overall cardiomediastinal silhouette is stable in size and configuration. Lungs are clear. Lung volumes are upper normal. No pleural effusion seen. No pneumothorax seen. Osseous structures about the chest are unremarkable. IMPRESSION: No active disease. Electronically Signed   By: Franki Cabot M.D.   On: 12/21/2015 12:25    Assessment & Plan by Problem: Active Problems:   Ankle fracture  Left ankle medial and lateral malleolus fracture: xrays unchanged from the ones obtained on 1/28. Orthopedics was consulted and they plan on surgery Monday or Tues. MET score at least 3-4 - difficult to assess due to arthritis of knees and general tearfulness   -med-surg -bed-rest  Consult orthopedic surgery, surgery in next 2 days  -norco 5-325 q4 hours prn  -zofran prn -PT and OT -CSW for SNF placement  -repeat cbc and bmet, PT INR    HTN: -on diovan -hold diovan given soft bp (valsartan-hctz)   Stress incontinence -myrbetriq   F E N HH  DVT ppx: heparin tis Code- full           Dispo: Disposition is deferred at this time, awaiting improvement of current medical problems. Anticipated  discharge in approximately 2 day(s).   The patient does have a current PCP (Jamie Lei, MD) and does need an [facility] hospital follow-up appointment after discharge.  The patient does not have transportation limitations that hinder transportation to clinic appointments.  Signed: Burgess Estelle, MD 12/21/2015, 3:15 PM

## 2015-12-21 NOTE — ED Notes (Signed)
Admitting team at bedside.

## 2015-12-21 NOTE — Progress Notes (Signed)
Pt admitted into [facility] from ED accompanied by daughter.  Pt lives with daughter and is in with her fractured ankle.  Pt was unable to follow up with orthopedic doctor after being discharged from the ED last time with the ankle fracture.  Pt states she had run out of one of her medicines at home.  Pt tearful at times.  Daughter at bedside.  Left ankle painful to touch and move.  Skin on LLE is extremely dry, Eucerin cream applied to both feet and lower legs.  2+ dorsalis pedal pulses bilaterally.

## 2015-12-21 NOTE — ED Notes (Signed)
Fall on 01/28 resulting in left ankle fracture that was splinted in ED. Did not seek any follow up with ortho. Daughter states she could not take her to ortho because her mother could not bear weight and she could not get her out of the house. Daughter states she wants her to be put in a nursing facility. Left weakness from old stroke, not new. Patient took hydrocodone for ankle pain at 0730, reports pain 3/10.

## 2015-12-21 NOTE — ED Provider Notes (Signed)
CSN: [id number]     Arrival date & time 12/21/15  6962 History   First MD Initiated Contact with Patient 12/21/15 [phone]     Chief Complaint  Patient presents with  . Ankle Pain      HPI  Patient presents for evaluation with a left ankle fracture. Seen and diagnosed 128 with a fall. Seen here. Placed in a posterior splint. Wanted to be followed up as an outpatient with Dr. Montez Morita arthritic surgeon down who are done and her total knee several years ago. Daughter states that she is not doing well at home. She was not given crutches or able to ambulate on them. Does not have a wheelchair. Was offered crutches and an home health care nursing in the emergency room. Daughter states that she called the next day and was told by primary care physician" rehabilitation might be better". Never sought follow-up with the surgeon because "she can't walk".  Past Medical History  Diagnosis Date  . Hypertension   . Aneurysm ([facility])   . Stroke [facility]) 2011  . Arthritis    Past Surgical History  Procedure Laterality Date  . Total knee arthroplasty    . Brain surgery  2011    coils inserted  . Vulvar lesion removal  03/17/2012    Procedure: VULVAR LESION;  Surgeon: Brock Bad, MD;  Location: [facility];  Service: Gynecology;  Laterality: N/A;  CO2 Laser Vaporization Of Condyloma   History reviewed. No pertinent family history. Social History  Substance Use Topics  . Smoking status: Current Every Day Smoker -- 1.50 packs/day  . Smokeless tobacco: None  . Alcohol Use: No   OB History    No data available     Review of Systems  Constitutional: Negative for fever, chills, diaphoresis, appetite change and fatigue.  HENT: Negative for mouth sores, sore throat and trouble swallowing.   Eyes: Negative for visual disturbance.  Respiratory: Negative for cough, chest tightness, shortness of breath and wheezing.   Cardiovascular: Negative for chest pain.  Gastrointestinal: Negative for nausea, vomiting,  abdominal pain, diarrhea and abdominal distention.  Endocrine: Negative for polydipsia, polyphagia and polyuria.  Genitourinary: Negative for dysuria, frequency and hematuria.  Musculoskeletal: Positive for arthralgias. Negative for gait problem.  Skin: Negative for color change, pallor and rash.  Neurological: Negative for dizziness, syncope, light-headedness and headaches.  Hematological: Does not bruise/bleed easily.  Psychiatric/Behavioral: Negative for behavioral problems and confusion.      Allergies  Review of patient's allergies indicates no known allergies.  Home Medications   Prior to Admission medications   Medication Sig Start Date End Date Taking? Authorizing Provider  HYDROcodone-acetaminophen (NORCO/VICODIN) 5-325 MG tablet Take 1 tablet by mouth every 4 (four) hours as needed for moderate pain. 12/06/15  Yes Gilda Crease, MD  ibuprofen (ADVIL,MOTRIN) 200 MG tablet Take 400 mg by mouth 2 (two) times daily as needed for moderate pain.   Yes Historical Provider, MD  montelukast (SINGULAIR) 10 MG tablet Take 10 mg by mouth daily.    Yes Historical Provider, MD  MYRBETRIQ 25 MG TB24 tablet Take 25 mg by mouth daily. 10/14/15  Yes Historical Provider, MD  celecoxib (CELEBREX) 200 MG capsule Take 200 mg by mouth daily.    Historical Provider, MD  Misc. Devices (WHEEL CHAIR K1 BASIC DESK ARM) MISC 1 Device by Does not apply route as needed. 12/06/15   Gilda Crease, MD  valsartan-hydrochlorothiazide (DIOVAN-HCT) 160-12.5 MG per tablet Take 1 tablet by mouth daily.  Historical Provider, MD   BP 123/62 mmHg  Pulse 70  Temp(Src) 98.8 F (37.1 C) (Oral)  Resp 19  SpO2 95% Physical Exam  Constitutional: She is oriented to person, place, and time. She appears well-developed and well-nourished. No distress.  HENT:  Head: Normocephalic.  Eyes: Conjunctivae are normal. Pupils are equal, round, and reactive to light. No scleral icterus.  Neck: Normal range of  motion. Neck supple. No thyromegaly present.  Cardiovascular: Normal rate and regular rhythm.  Exam reveals no gallop and no friction rub.   No murmur heard. Pulmonary/Chest: Effort normal and breath sounds normal. No respiratory distress. She has no wheezes. She has no rales.  Abdominal: Soft. Bowel sounds are normal. She exhibits no distension. There is no tenderness. There is no rebound.  Musculoskeletal: Normal range of motion.       Legs: Neurological: She is alert and oriented to person, place, and time.  Skin: Skin is warm and dry. No rash noted.  Psychiatric: She has a normal mood and affect. Her behavior is normal.    ED Course  Procedures (including critical care time) Labs Review Labs Reviewed  CBC WITH DIFFERENTIAL/PLATELET - Abnormal; Notable for the following:    RBC 3.68 (*)    Hemoglobin 11.4 (*)    HCT 34.2 (*)    All other components within normal limits  BASIC METABOLIC PANEL - Abnormal; Notable for the following:    Glucose, Bld 101 (*)    All other components within normal limits  PROTIME-INR  URINALYSIS, ROUTINE W REFLEX MICROSCOPIC (NOT AT [facility])    Imaging Review Dg Ankle Complete Left  12/21/2015  CLINICAL DATA:  Fall on 12/06/2015, ankle fracture EXAM: LEFT ANKLE COMPLETE - 3+ VIEW COMPARISON:  12/06/2015 FINDINGS: Comminuted distal fibular fracture, with mild apex medial angulation. Medial malleolar fracture with mild lateral displacement of the distal fracture fragment. Possible mild widening of the distal tibiofibular syndesmosis. Ankle mortise is notable for lateral talar shift. Associated soft tissue swelling. Overall, these findings are essentially unchanged from the prior study. IMPRESSION: Comminuted distal fibular fracture, as above. Mildly displaced medial malleolar fracture. Lateral talar shift. Overall, these findings are essentially unchanged from the prior. Electronically Signed   By: Charline Bills M.D.   On: 12/21/2015 10:43   Dg Chest Port  1 View  12/21/2015  CLINICAL DATA:  Fall, broken ankle.  Preoperative evaluation. EXAM: PORTABLE CHEST 1 VIEW COMPARISON:  Chest x-ray dated 06/21/2012. FINDINGS: Heart size is upper normal, unchanged. Overall cardiomediastinal silhouette is stable in size and configuration. Lungs are clear. Lung volumes are upper normal. No pleural effusion seen. No pneumothorax seen. Osseous structures about the chest are unremarkable. IMPRESSION: No active disease. Electronically Signed   By: Bary Richard M.D.   On: 12/21/2015 12:25   I have personally reviewed and evaluated these images and lab results as part of my medical decision-making.   EKG Interpretation None      MDM   Final diagnoses:  Ankle fracture, bimalleolar, closed, left, with routine healing, subsequent encounter    Pts prior surgeon, Dr. Montez Morita is unavailable. D/W Dr. Roda Shutters. He is agreeable to seeing patient and planning inpatient ORIF.  Internam Medicine Resident service to admit.    Rolland Porter, MD 12/21/15 [phone]

## 2015-12-21 NOTE — ED Notes (Signed)
Patient transported to X-ray 

## 2015-12-22 LAB — BASIC METABOLIC PANEL
ANION GAP: 7 (ref 5–15)
BUN: 10 mg/dL (ref 6–20)
CALCIUM: 9.3 mg/dL (ref 8.9–10.3)
CO2: 27 mmol/L (ref 22–32)
Chloride: 109 mmol/L (ref 101–111)
Creatinine, Ser: 0.81 mg/dL (ref 0.44–1.00)
GFR calc Af Amer: >60 mL/min (ref >60)
GLUCOSE: 100 mg/dL — AB (ref 65–99)
POTASSIUM: 3.8 mmol/L (ref 3.5–5.1)
Sodium: 143 mmol/L (ref 135–145)

## 2015-12-22 LAB — CBC
HEMATOCRIT: 35.3 % — AB (ref 36.0–46.0)
Hemoglobin: 11.4 g/dL — ABNORMAL LOW (ref 12.0–15.0)
MCH: 29.4 pg (ref 26.0–34.0)
MCHC: 32.3 g/dL (ref 30.0–36.0)
MCV: 91 fL (ref 78.0–100.0)
PLATELETS: 368 10*3/uL (ref 150–400)
RBC: 3.88 MIL/uL (ref 3.87–5.11)
RDW: 14.4 % (ref 11.5–15.5)
WBC: 5.6 10*3/uL (ref 4.0–10.5)

## 2015-12-22 LAB — APTT: APTT: 43 s — AB (ref 24–37)

## 2015-12-22 LAB — SURGICAL PCR SCREEN
MRSA, PCR: NEGATIVE
Staphylococcus aureus: POSITIVE — AB

## 2015-12-22 LAB — PROTIME-INR
INR: 1.17 (ref 0.00–1.49)
Prothrombin Time: 15.1 seconds (ref 11.6–15.2)

## 2015-12-22 LAB — HIV ANTIBODY (ROUTINE TESTING W REFLEX): HIV Screen 4th Generation wRfx: NONREACTIVE

## 2015-12-22 MED ORDER — MUPIROCIN 2 % EX OINT
1.0000 "application " | TOPICAL_OINTMENT | Freq: Two times a day (BID) | CUTANEOUS | Status: DC
  Administered 2015-12-22 – 2015-12-25 (×6): 1 via NASAL
  Filled 2015-12-22 (×2): qty 22

## 2015-12-22 MED ORDER — LOSARTAN POTASSIUM 50 MG PO TABS
25.0000 mg | ORAL_TABLET | Freq: Every day | ORAL | Status: DC
  Administered 2015-12-22 – 2015-12-25 (×4): 25 mg via ORAL
  Filled 2015-12-22 (×4): qty 1

## 2015-12-22 MED ORDER — SODIUM CHLORIDE 0.9 % IV SOLN
INTRAVENOUS | Status: AC
  Administered 2015-12-22: 13:00:00 via INTRAVENOUS

## 2015-12-22 MED ORDER — CHLORHEXIDINE GLUCONATE CLOTH 2 % EX PADS
6.0000 | MEDICATED_PAD | Freq: Every day | CUTANEOUS | Status: DC
  Administered 2015-12-23 – 2015-12-25 (×3): 6 via TOPICAL

## 2015-12-22 MED ORDER — HYDROCODONE-ACETAMINOPHEN 5-325 MG PO TABS
2.0000 | ORAL_TABLET | ORAL | Status: DC | PRN
Start: 2015-12-22 — End: 2015-12-25
  Administered 2015-12-22 – 2015-12-25 (×8): 2 via ORAL
  Filled 2015-12-22 (×9): qty 2

## 2015-12-22 NOTE — Progress Notes (Signed)
PT Cancellation Note  Patient Details Name: Jamie Haas MRN: [id number] DOB: 03/28/1946   Cancelled Treatment:    Reason Eval/Treat Not Completed: Patient not medically ready   Noted pt is on strict bedrest until post surgery;  Will follow up for PT eval at that time;   Please re-order PT with weight bearing status postop;  Thanks,  Van Clines, PT  Acute Rehabilitation Services Pager [phone] Office [phone]    Van Clines [facility] 12/22/2015, 8:15 AM

## 2015-12-22 NOTE — Progress Notes (Signed)
Orthopedic Tech Progress Note Patient Details:  Jamie Haas 02-13-1946 [id number]  Ortho Devices Type of Ortho Device: Ace wrap, Post (short leg) splint Ortho Device/Splint Location: lle Ortho Device/Splint Interventions: Application   [name] 12/22/2015, 9:20 AM

## 2015-12-22 NOTE — Consult Note (Signed)
ORTHOPAEDIC CONSULTATION  REQUESTING PHYSICIAN: Doneen Poisson, MD  Chief Complaint: Left ankle fracture  HPI: Jamie Haas is a 70 y.o. female who presents with subacute left ankle fracture on 1/28.  Had mechanical fall on 1/28 and was evaluated in ER and patient wanted to follow up with Dr. Myrtie Neither.  They never followed up and presented yesterday with difficulty ambulating and pain so they came to the ER.  Dr. Montez Morita is retired.  Pain is 4/10, nonradiating, worse with movement of ankle, throbbing pain.  Ortho consulted.  Past Medical History  Diagnosis Date  . Hypertension   . Aneurysm ([facility])   . Stroke [facility]) 2011  . Arthritis    Past Surgical History  Procedure Laterality Date  . Total knee arthroplasty    . Brain surgery  2011    coils inserted  . Vulvar lesion removal  03/17/2012    Procedure: VULVAR LESION;  Surgeon: Brock Bad, MD;  Location: [facility];  Service: Gynecology;  Laterality: N/A;  CO2 Laser Vaporization Of Condyloma   Social History   Social History  . Marital Status: Legally Separated    Spouse Name: N/A  . Number of Children: N/A  . Years of Education: N/A   Social History Main Topics  . Smoking status: Current Every Day Smoker -- 1.50 packs/day  . Smokeless tobacco: None  . Alcohol Use: No  . Drug Use: No  . Sexual Activity: Not Asked   Other Topics Concern  . None   Social History Narrative   Family History  Problem Relation Age of Onset  . Hypertension Mother   . Osteoarthritis Mother   . Cancer Mother    - negative except otherwise stated in the family history section No Known Allergies Prior to Admission medications   Medication Sig Start Date End Date Taking? Authorizing Provider  HYDROcodone-acetaminophen (NORCO/VICODIN) 5-325 MG tablet Take 1 tablet by mouth every 4 (four) hours as needed for moderate pain. 12/06/15  Yes Gilda Crease, MD  ibuprofen (ADVIL,MOTRIN) 200 MG tablet Take 400 mg by mouth 2 (two)  times daily as needed for moderate pain.   Yes Historical Provider, MD  montelukast (SINGULAIR) 10 MG tablet Take 10 mg by mouth daily.    Yes Historical Provider, MD  MYRBETRIQ 25 MG TB24 tablet Take 25 mg by mouth daily. 10/14/15  Yes Historical Provider, MD  celecoxib (CELEBREX) 200 MG capsule Take 200 mg by mouth daily.    Historical Provider, MD  Misc. Devices (WHEEL CHAIR K1 BASIC DESK ARM) MISC 1 Device by Does not apply route as needed. 12/06/15   Gilda Crease, MD  valsartan-hydrochlorothiazide (DIOVAN-HCT) 160-12.5 MG per tablet Take 1 tablet by mouth daily.    Historical Provider, MD   Dg Ankle Complete Left  12/21/2015  CLINICAL DATA:  Fall on 12/06/2015, ankle fracture EXAM: LEFT ANKLE COMPLETE - 3+ VIEW COMPARISON:  12/06/2015 FINDINGS: Comminuted distal fibular fracture, with mild apex medial angulation. Medial malleolar fracture with mild lateral displacement of the distal fracture fragment. Possible mild widening of the distal tibiofibular syndesmosis. Ankle mortise is notable for lateral talar shift. Associated soft tissue swelling. Overall, these findings are essentially unchanged from the prior study. IMPRESSION: Comminuted distal fibular fracture, as above. Mildly displaced medial malleolar fracture. Lateral talar shift. Overall, these findings are essentially unchanged from the prior. Electronically Signed   By: Charline Bills M.D.   On: 12/21/2015 10:43   Dg Chest Port 1 View  12/21/2015  CLINICAL DATA:  Fall, broken ankle.  Preoperative evaluation. EXAM: PORTABLE CHEST 1 VIEW COMPARISON:  Chest x-ray dated 06/21/2012. FINDINGS: Heart size is upper normal, unchanged. Overall cardiomediastinal silhouette is stable in size and configuration. Lungs are clear. Lung volumes are upper normal. No pleural effusion seen. No pneumothorax seen. Osseous structures about the chest are unremarkable. IMPRESSION: No active disease. Electronically Signed   By: Bary Richard M.D.   On:  12/21/2015 12:25   - pertinent xrays, CT, MRI studies were reviewed and independently interpreted  Positive ROS: All other systems have been reviewed and were otherwise negative with the exception of those mentioned in the HPI and as above.  Physical Exam: General: Alert, no acute distress Cardiovascular: No pedal edema Respiratory: No cyanosis, no use of accessory musculature GI: No organomegaly, abdomen is soft and non-tender Skin: No lesions in the area of chief complaint Neurologic: Sensation intact distally Psychiatric: Patient is competent for consent with normal mood and affect Lymphatic: No axillary or cervical lymphadenopathy  MUSCULOSKELETAL:  - ankle skin intact, no signs of impending wounds - foot wwp, 2+ pulses - NVI  Assessment: Left subacute bimalleolar ankle fx  Plan: - surgery will likely be Wednesday, possibly Tuesday - heparin subq for now - splint ordered  Thank you for the consult and the opportunity to see Jamie Haas. Glee Arvin, MD [facility] [phone] 7:34 AM

## 2015-12-22 NOTE — H&P (Signed)
Internal Medicine Attending Admission Note Date: 12/22/2015  Patient name: Jamie Haas Medical record number: [id number] Date of birth: [birth year] Age: 70 y.o. Gender: female  I saw and evaluated the patient. I reviewed the resident's note and I agree with the resident's findings and plan as documented in the resident's note.  Chief Complaint(s): Continued left ankle pain 15 days after a fall with fracture and inability to follow-up with orthopedic surgery as an outpatient.  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 She had continuing pain at home and was unable to go anywhere because she was nonweightbearing, had no crutches, and had no wheelchair. When she could no longer tolerate the pain she presented to the emergency department for further evaluation. She was admitted to the internal medicine teaching service for preoperative care.  Physical Exam - key components related to admission:  Filed Vitals:   12/22/15 0045 12/22/15 0652 12/22/15 1441 12/22/15 1748  BP: 153/92 148/90 112/72 138/81  Pulse: 87 83 77 72  Temp: 98.2 F (36.8 C) 98.2 F (36.8 C) 98 F (36.7 C) 98.2 F (36.8 C)  TempSrc: Oral Oral Oral Oral  Resp: Height:      Weight:      SpO2: 98% 100% 98% 98%   Gen.: Well-developed, well-nourished, woman lying comfortably in bed in no acute distress. Left lower extremity: Left ankle mildly swollen with slight lateral displacement of the foot.  Lab results:  Basic Metabolic Panel:  Recent Labs  40/98/11 1139  12/22/15 0513  NA 145 143  K 4.9 3.8  CL 108 109  CO2 26 27  GLUCOSE 101* 100*  BUN 15 10  CREATININE 0.73 0.81  CALCIUM 9.5 9.3   CBC:  Recent Labs  12/21/15 1139 12/22/15 0513  WBC 7.0 5.6  NEUTROABS 3.7  --   HGB 11.4* 11.4*  HCT 34.2* 35.3*  MCV 92.9 91.0  PLT 345 368   Coagulation:  Recent Labs  12/21/15 1139 12/22/15 0513  INR 1.18 1.17   Urinalysis:  Yellow, cloudy, specific gravity 1.022, pH 6.0, hemoglobin small, nitrite negative, leukocytes small, 0-5 white blood cells per high-power field, 0-5 red blood cells per high-power field.  Misc. Labs:  HIV antibody nonreactive  Imaging results:  Dg Ankle Complete Left  12/21/2015  CLINICAL DATA:  Fall on 12/06/2015, ankle fracture EXAM: LEFT ANKLE COMPLETE - 3+ VIEW COMPARISON:  12/06/2015 FINDINGS: Comminuted distal fibular fracture, with mild apex medial angulation. Medial malleolar fracture with mild lateral displacement of the distal fracture fragment. Possible mild widening of the distal tibiofibular syndesmosis. Ankle mortise is notable for lateral talar shift. Associated soft tissue swelling. Overall, these findings are essentially unchanged from the prior study. IMPRESSION: Comminuted distal fibular fracture, as above. Mildly displaced medial malleolar fracture. Lateral talar shift. Overall, these findings are essentially unchanged from the prior. Electronically Signed   By: Charline Bills M.D.   On: 12/21/2015 10:43   Dg Chest Port 1 View  12/21/2015  CLINICAL DATA:  Fall, broken ankle.  Preoperative evaluation. EXAM: PORTABLE CHEST 1 VIEW COMPARISON:  Chest  x-ray dated 06/21/2012. FINDINGS: Heart size is upper normal, unchanged. Overall cardiomediastinal silhouette is stable in size and configuration. Lungs are clear. Lung volumes are upper normal. No pleural effusion seen. No pneumothorax seen. Osseous structures about the chest are unremarkable. IMPRESSION: No active disease. Electronically Signed   By:  Bary Richard M.D.   On: 12/21/2015 12:25   Other results:  EKG: Normal sinus rhythm at 71 bpm, normal axis, normal intervals, no significant Q waves, no LVH by voltage, early R wave progression, early repolarization in leads II, III and aVL, no T wave changes. This ECG is unchanged from the previous ECG on 03/02/2012.  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 She requires intraoperative repair.  1) Left ankle fractures: We appreciate orthopedic surgery consultation. They plan on intraoperative repair either on Tuesday or Wednesday. In the meantime we will keep her nonweightbearing and make sure the left ankle is elevated to decrease skin swelling. We will manage her hypertension as she waits for her surgical procedure.  2) Disposition: Pending her initial recovery from the surgical procedure. I suspect she will require skilled nursing facility placement for rehabilitation immediately postop.

## 2015-12-22 NOTE — Progress Notes (Signed)
Orthopedic Tech Progress Note Patient Details:  Jamie Haas 1946-09-14 [id number]  Patient ID: [name], female   DOB: [birth year], 70 y.o.   MRN: [id number] Viewed order from doctor's order list  Nikki Dom 12/22/2015, 9:20 AM

## 2015-12-22 NOTE — Progress Notes (Signed)
Subjective:  No acute events. Pain well controlled on the pain medicines. Ortho saw her, surgery tue or wed   Objective: Vital signs in last 24 hours: Filed Vitals:   12/21/15 1430 12/21/15 1553 12/22/15 0045 12/22/15 0652  BP: 93/78 135/60 153/92 148/90  Pulse: 73  87 83  Temp:  98.8 F (37.1 C) 98.2 F (36.8 C) 98.2 F (36.8 C)  TempSrc:  Oral Oral Oral  Resp: Height:   (1.651 m)    Weight:  189 lb 2.5 oz (85.8 kg)    SpO2: 97% 100% 98% 100%   Weight change:   Intake/Output Summary (Last 24 hours) at 12/22/15 1301 Last data filed at 12/22/15 0500  Gross per 24 hour  Intake    120 ml  Output    600 ml  Net   -480 ml   General: Vital signs reviewed. Patient in no acute distress Cardiovascular: regular rate, rhythm, no murmur appreciated  Abdominal: Soft, non-tender, non-distended, BS + Extremities: no edema of feet, left leg tender to touch but no warmth or redness    Lab Results: Results for orders placed or performed during the hospital encounter of 12/21/15 (from the past 24 hour(s))  Urinalysis, Routine w reflex microscopic (not at [facility])     Status: Abnormal   Collection Time: 12/21/15  2:06 PM  Result Value Ref Range   Color, Urine YELLOW YELLOW   APPearance CLOUDY (A) CLEAR   Specific Gravity, Urine 1.022 1.005 - 1.030   pH 6.0 5.0 - 8.0   Glucose, UA NEGATIVE NEGATIVE mg/dL   Hgb urine dipstick SMALL (A) NEGATIVE   Bilirubin Urine NEGATIVE NEGATIVE   Ketones, ur NEGATIVE NEGATIVE mg/dL   Protein, ur NEGATIVE NEGATIVE mg/dL   Nitrite NEGATIVE NEGATIVE   Leukocytes, UA SMALL (A) NEGATIVE  Urine microscopic-add on     Status: Abnormal   Collection Time: 12/21/15  2:06 PM  Result Value Ref Range   Squamous Epithelial / LPF 6-30 (A) NONE SEEN   WBC, UA 0-5 0 - 5 WBC/hpf   RBC / HPF 0-5 0 - 5 RBC/hpf   Bacteria, UA NONE SEEN NONE SEEN  Surgical pcr screen     Status: Abnormal   Collection Time: 12/21/15  9:52 PM  Result Value Ref  Range   MRSA, PCR NEGATIVE NEGATIVE   Staphylococcus aureus POSITIVE (A) NEGATIVE  CBC     Status: Abnormal   Collection Time: 12/22/15  5:13 AM  Result Value Ref Range   WBC 5.6 4.0 - 10.5 K/uL   RBC 3.88 3.87 - 5.11 MIL/uL   Hemoglobin 11.4 (L) 12.0 - 15.0 g/dL   HCT 40.9 (L) 81.1 - 91.4 %   MCV 91.0 78.0 - 100.0 fL   MCH 29.4 26.0 - 34.0 pg   MCHC 32.3 30.0 - 36.0 g/dL   RDW 78.2 95.6 - 21.3 %   Platelets 368 150 - 400 K/uL  Basic metabolic panel     Status: Abnormal   Collection Time: 12/22/15  5:13 AM  Result Value Ref Range   Sodium 143 135 - 145 mmol/L   Potassium 3.8 3.5 - 5.1 mmol/L   Chloride 109 101 - 111 mmol/L   CO2 27 22 - 32 mmol/L   Glucose, Bld 100 (H) 65 - 99 mg/dL   BUN 10 6 - 20 mg/dL   Creatinine, Ser 0.86 0.44 - 1.00 mg/dL   Calcium 9.3 8.9 - 57.8 mg/dL  GFR calc non Af Amer >60 >60 mL/min   GFR calc Af Amer >60 >60 mL/min   Anion gap 7 5 - 15  Protime-INR     Status: None   Collection Time: 12/22/15  5:13 AM  Result Value Ref Range   Prothrombin Time 15.1 11.6 - 15.2 seconds   INR 1.17 0.00 - 1.49  APTT     Status: Abnormal   Collection Time: 12/22/15  5:13 AM  Result Value Ref Range   aPTT 43 (H) 24 - 37 seconds     Micro Results: Recent Results (from the past 240 hour(s))  Surgical pcr screen     Status: Abnormal   Collection Time: 12/21/15  9:52 PM  Result Value Ref Range Status   MRSA, PCR NEGATIVE NEGATIVE Final   Staphylococcus aureus POSITIVE (A) NEGATIVE Final    Comment:        The Xpert SA Assay (FDA approved for NASAL specimens in patients over 88 years of age), is one component of a comprehensive surveillance program.  Test performance has been validated by [facility] for patients greater than or equal to 41 year old. It is not intended to diagnose infection nor to guide or monitor treatment.    Studies/Results: Dg Ankle Complete Left  12/21/2015  CLINICAL DATA:  Fall on 12/06/2015, ankle fracture EXAM: LEFT ANKLE  COMPLETE - 3+ VIEW COMPARISON:  12/06/2015 FINDINGS: Comminuted distal fibular fracture, with mild apex medial angulation. Medial malleolar fracture with mild lateral displacement of the distal fracture fragment. Possible mild widening of the distal tibiofibular syndesmosis. Ankle mortise is notable for lateral talar shift. Associated soft tissue swelling. Overall, these findings are essentially unchanged from the prior study. IMPRESSION: Comminuted distal fibular fracture, as above. Mildly displaced medial malleolar fracture. Lateral talar shift. Overall, these findings are essentially unchanged from the prior. Electronically Signed   By: Charline Bills M.D.   On: 12/21/2015 10:43   Dg Chest Port 1 View  12/21/2015  CLINICAL DATA:  Fall, broken ankle.  Preoperative evaluation. EXAM: PORTABLE CHEST 1 VIEW COMPARISON:  Chest x-ray dated 06/21/2012. FINDINGS: Heart size is upper normal, unchanged. Overall cardiomediastinal silhouette is stable in size and configuration. Lungs are clear. Lung volumes are upper normal. No pleural effusion seen. No pneumothorax seen. Osseous structures about the chest are unremarkable. IMPRESSION: No active disease. Electronically Signed   By: Bary Richard M.D.   On: 12/21/2015 12:25   Medications: I have reviewed the patient's current medications. Scheduled Meds: . diclofenac sodium  2 g Topical QID  . heparin  5,000 Units Subcutaneous 3 times per day  . hydrocerin   Topical BID  . mirabegron ER  25 mg Oral Daily  . montelukast  10 mg Oral Daily  . nicotine  14 mg Transdermal Daily   Continuous Infusions: . sodium chloride     PRN Meds:.HYDROcodone-acetaminophen, ondansetron **OR** ondansetron (ZOFRAN) IV, polyethylene glycol Assessment/Plan: Active Problems:   Ankle fracture   Trimalleolar fracture of ankle, closed   Ankle fracture, bimalleolar, closed   Essential hypertension   Arthritis, senescent   Pressure ulcer  Left ankle medial and lateral  malleolus fracture:  Orthopaedics consulted- surgery likely Tuesday or Wednesday. PT INR wnl   -ortho following appreciate recs -HH diet, NPO after midnight for possible sx tomorrow -norco 5-325 q4 hours prn -zofran PRN -PTOT -CSW for SNF   HTN: -on home diovan (valsartan-hctz). MAP in 80s, BP mildly elevated   -restarted losartan 25 mg daily  Stress incontinence -myrbetriq    Dispo: Disposition is deferred at this time, awaiting improvement of current medical problems.  Anticipated discharge in approximately 2 day(s).   The patient does have a current PCP (Renaye Rakers, MD) and does need an [facility] hospital follow-up appointment after discharge.  The patient does not have transportation limitations that hinder transportation to clinic appointments.  .Services Needed at time of discharge: Y = Yes, Blank = No PT:   OT:   RN:   Equipment:   Other:     LOS: 1 day   Deneise Lever, MD 12/22/2015, 1:01 PM

## 2015-12-22 NOTE — Progress Notes (Signed)
OT Cancellation Note  Patient Details Name: Jamie Haas MRN: [id number] DOB: October 17, 1946   Cancelled Treatment:    Reason Eval/Treat Not Completed: Patient not medically ready. Noted pt is on strict bedrest until post surgery;  Will follow up for OT eval at that time. Please re-order OT with weight bearing status postop.  Nils Pyle, OTR/L Pager: [phone] 12/22/2015, 9:35 AM

## 2015-12-22 NOTE — Progress Notes (Signed)
[facility]  Patient Status: Active (receiving services up to time of hospitalization)  [facility] is providing the following services: PT and OT  If patient discharges after hours, please call [phone].   Jamie Haas 12/22/2015, 9:33 AM

## 2015-12-23 LAB — URINALYSIS, ROUTINE W REFLEX MICROSCOPIC
Bilirubin Urine: NEGATIVE
Glucose, UA: NEGATIVE mg/dL
Ketones, ur: NEGATIVE mg/dL
Nitrite: NEGATIVE
Protein, ur: NEGATIVE mg/dL
SPECIFIC GRAVITY, URINE: 1.018 (ref 1.005–1.030)
pH: 6 (ref 5.0–8.0)

## 2015-12-23 LAB — URINE MICROSCOPIC-ADD ON

## 2015-12-23 MED ORDER — CEFAZOLIN SODIUM-DEXTROSE 2-3 GM-% IV SOLR
2.0000 g | INTRAVENOUS | Status: AC
  Administered 2015-12-24: 2 g via INTRAVENOUS
  Filled 2015-12-23 (×2): qty 50

## 2015-12-23 MED ORDER — SODIUM CHLORIDE 0.9 % IV SOLN: INTRAVENOUS | Status: AC

## 2015-12-23 MED ORDER — HEPARIN SODIUM (PORCINE) 5000 UNIT/ML IJ SOLN
5000.0000 [IU] | Freq: Three times a day (TID) | INTRAMUSCULAR | Status: AC
  Administered 2015-12-23: 5000 [IU] via SUBCUTANEOUS
  Filled 2015-12-23: qty 1

## 2015-12-23 NOTE — Discharge Summary (Signed)
Name: Jamie Haas MRN: [id number] DOB: 03/08/1946 70 y.o. PCP: Renaye Rakers, MD  Date of Admission: 12/21/2015  9:22 AM Date of Discharge: 12/25/2015 Attending Physician: Doneen Poisson, MD  Discharge Diagnosis: 1. Left ankle fracture s/p ORIF  Active Problems:   Ankle fracture   Trimalleolar fracture of ankle, closed   Ankle fracture, bimalleolar, closed   Essential hypertension   Arthritis, senescent   Pressure ulcer  Discharge Medications:   Medication List    TAKE these medications        acetaminophen 325 MG tablet  Commonly known as:  TYLENOL  Take 2 tablets (650 mg total) by mouth every 6 (six) hours as needed for mild pain or moderate pain (or Fever >/= 101).     celecoxib 200 MG capsule  Commonly known as:  CELEBREX  Take 200 mg by mouth daily.     diclofenac sodium 1 % Gel  Commonly known as:  VOLTAREN  Apply 2 g topically 4 (four) times daily.     enoxaparin 40 MG/0.4ML injection  Commonly known as:  LOVENOX  Inject 0.4 mLs (40 mg total) into the skin daily.     HYDROcodone-acetaminophen 5-325 MG tablet  Commonly known as:  NORCO/VICODIN  Take 2 tablets by mouth every 4 (four) hours as needed for moderate pain or severe pain.     ibuprofen 200 MG tablet  Commonly known as:  ADVIL,MOTRIN  Take 400 mg by mouth 2 (two) times daily as needed for moderate pain.     methocarbamol 500 MG tablet  Commonly known as:  ROBAXIN  Take 1 tablet (500 mg total) by mouth every 6 (six) hours as needed for muscle spasms.     montelukast 10 MG tablet  Commonly known as:  SINGULAIR  Take 10 mg by mouth daily.     MYRBETRIQ 25 MG Tb24 tablet  Generic drug:  mirabegron ER  Take 25 mg by mouth daily.     nicotine 14 mg/24hr patch  Commonly known as:  NICODERM CQ - dosed in mg/24 hours  Place 1 patch (14 mg total) onto the skin daily.     polyethylene glycol packet  Commonly known as:  MIRALAX / GLYCOLAX  Take 17 g by mouth daily.     senna-docusate  8.6-50 MG tablet  Commonly known as:  Senokot-S  Take 2 tablets by mouth 2 (two) times daily as needed for mild constipation or moderate constipation.     valsartan-hydrochlorothiazide 160-12.5 MG tablet  Commonly known as:  DIOVAN-HCT  Take 1 tablet by mouth daily.     Wheel Chair K1 Basic Desk Arm Misc  1 Device by Does not apply route as needed.        Disposition and follow-up:   Ms.Gretchen L Nagele was discharged from [facility] in Stable condition.  At the hospital follow up visit please address:  1.  Need for continued DVT prophylaxis, pain control, PT  2.  Labs / imaging needed at time of follow-up: none  3.  Pending labs/ test needing follow-up: none  Follow-up Appointments: Follow-up Information    Follow up with Cheral Almas, MD In 2 weeks.   Specialty:  Orthopedic Surgery   Why:  For suture removal, For wound re-check   Contact information:   [address] [place] [address] [phone]       Follow up with Geraldo Pitter, MD. Go on 12/30/2015.   Specialty:  Family Medicine   Why:  3:15 pm   Contact information:   [address] [place] [address] [phone]       Discharge Instructions: Discharge Instructions    Call MD for:  persistant nausea and vomiting    Complete by:  As directed      Call MD for:  redness, tenderness, or signs of infection (pain, swelling, redness, odor or green/yellow discharge around incision site)    Complete by:  As directed      Call MD for:  severe uncontrolled pain    Complete by:  As directed      Call MD for:  temperature >100.4    Complete by:  As directed      Diet - low sodium heart healthy    Complete by:  As directed      Increase activity slowly    Complete by:  As directed      Leave dressing on - Keep it clean, dry, and intact until clinic visit    Complete by:  As directed      Non weight bearing    Complete by:  As directed            Consultations:      Procedures Performed:  Dg Ankle Complete Left  12/21/2015  CLINICAL DATA:  Fall on 12/06/2015, ankle fracture EXAM: LEFT ANKLE COMPLETE - 3+ VIEW COMPARISON:  12/06/2015 FINDINGS: Comminuted distal fibular fracture, with mild apex medial angulation. Medial malleolar fracture with mild lateral displacement of the distal fracture fragment. Possible mild widening of the distal tibiofibular syndesmosis. Ankle mortise is notable for lateral talar shift. Associated soft tissue swelling. Overall, these findings are essentially unchanged from the prior study. IMPRESSION: Comminuted distal fibular fracture, as above. Mildly displaced medial malleolar fracture. Lateral talar shift. Overall, these findings are essentially unchanged from the prior. Electronically Signed   By: Charline Bills M.D.   On: 12/21/2015 10:43   Dg Ankle Complete Left  12/06/2015  CLINICAL DATA:  Patient missed a step and fell today. Left ankle edema. EXAM: LEFT ANKLE COMPLETE - 3+ VIEW COMPARISON:  None. FINDINGS: Acute transverse fracture of the medial malleolus of the left ankle extending to the articular surface. There is lateral displacement of the distal fracture fragment. Widening of the medial tibiotalar joint. Oblique comminuted fracture of the distal left fibula with extension to the tibia fibular joint. There is lateral angulation of the distal fracture fragment. Lateral displacement of the talus with respect to the tibia. Narrowing of the lateral tibiotalar joint space. No definite posterior malleolar fracture. No focal bone lesion. Diffuse soft tissue swelling about the left ankle. IMPRESSION: Acute fractures of the medial and lateral malleolus of the left ankle with lateral displacement of the distal fracture fragments. Electronically Signed   By: Burman Nieves M.D.   On: 12/06/2015 02:04   Dg Chest Port 1 View  12/21/2015  CLINICAL DATA:  Fall, broken ankle.  Preoperative evaluation. EXAM: PORTABLE CHEST 1 VIEW  COMPARISON:  Chest x-ray dated 06/21/2012. FINDINGS: Heart size is upper normal, unchanged. Overall cardiomediastinal silhouette is stable in size and configuration. Lungs are clear. Lung volumes are upper normal. No pleural effusion seen. No pneumothorax seen. Osseous structures about the chest are unremarkable. IMPRESSION: No active disease. Electronically Signed   By: Bary Richard M.D.   On: 12/21/2015 12:25   Dg Foot Complete Left  12/06/2015  CLINICAL DATA:  Patient missed a step and fell today. Edema to the left ankle. EXAM:  LEFT FOOT - COMPLETE 3+ VIEW COMPARISON:  None. FINDINGS: Diffuse bone demineralization. Fractures are demonstrated in the distal left tibia and fibula. See additional report of left ankle. No additional fractures demonstrated in the tarsal, metatarsal, or phalangeal bones of the left foot. No focal bone lesion or bone destruction. Soft tissues swelling over the left ankle. IMPRESSION: Fractures of the medial and lateral malleolus of the left ankle better visualized on previous ankle radiographs. No additional fractures demonstrated in the left foot. Electronically Signed   By: Burman Nieves M.D.   On: 12/06/2015 02:05   Dg C-arm 1-60 Min  12/24/2015  CLINICAL DATA:  ORIF RIGHT ankle, trimalleolar fracture EXAM: LEFT ANKLE - COMPLETE 3+ VIEW; DG C-ARM 61-120 MIN COMPARISON:  12/21/2015 FINDINGS: Lateral plate and multiple screws identified at fibula post ORIF of a reduced distal fibular fracture. Single syndesmotic screw present. Two cannulated screws are present at the medial malleolus post ORIF of a reduced medial malleolar fracture. Posterior malleolar fracture fragment again identified. Ankle mortise intact. No other focal bony abnormality seen. IMPRESSION: Post ORIF of reduced medial malleolar and distal fibular fractures. Electronically Signed   By: Ulyses Southward M.D.   On: 12/24/2015 16:10    2D Echo:   Cardiac Cath:   Admission HPI:   THis is a 70 yo with history  of HTN, CVA in 2011, total knee arthroplasty of left one in 2011, who is here after the ER visit on Jan 28th. She had come in the ER on 1/28 after falling down. She was at her grand daughter's house and was walking out of it and missed a step and as a result had twisted her ankle and fell. She did not hit the head and no LOC. Since then, she was having constant pain in left ankle. The xray showed bi-malleolar fracture and put in splint and ER offered her orthopedic consult but the daughter said she wanted to follow up with her orthopedic surgeon Dr Montez Morita who operated on her knee, but was not able to as one, he retired and two, she was not able to get out of the house at all.  Patient continued to have 4/10 nonradiating throbbing pain since then and no change in that but today, the patient wanted a resolution of her symptoms so they brought her here.  She says she takes the Norco, and it brings her pain down to 3 from a 7. She takes it q4 hours.  She also has some throbbing of right knee.  She denies fevers, chills, n/v/d/c, cp or sob. Prior to the fracture, she was able to walk a flight of stairs without getting short of breath.   PMH: stroke with residual left sided weakness, HTN on Diovan, stress incontinence,  FH: HTn, cancer in mother and arthritis in father SH: smokes 0.75 ppd for many years 10+ years, no alcohol or illicit drug ue. Last drink > 1 month ago.  She lives by herself in an apartment but daughter comes to visit her.   Hospital Course by problem list: Active Problems:   Ankle fracture   Trimalleolar fracture of ankle, closed   Ankle fracture, bimalleolar, closed   Essential hypertension   Arthritis, senescent   Pressure ulcer   Left trimalleolar fracture: Orthopedics performed ORIF of trimalleolar fracture on 12/24/15. She is non-weight bearing on the left lower extremity for 6 weeks from surgery.  Patient's pain was controlled with Hydrocodone-APAP 5-325 mg 1-2 tablets  q4hours PRN.  Patient was transferred  to SNF for recovery.  She should continue Lovenox for DVT prophylaxis for 2 weeks.   Discharge Vitals:   BP 120/64 mmHg  Pulse 82  Temp(Src) 99.1 F (37.3 C) (Oral)  Resp 16  Ht 5\' 5"  (1.651 m)  Wt 189 lb 2.5 oz (85.8 kg)  BMI 31.48 kg/m2  SpO2 96%  Discharge Labs:  Results for orders placed or performed during the hospital encounter of 12/21/15 (from the past 24 hour(s))  CBC     Status: Abnormal   Collection Time: 12/25/15  5:47 AM  Result Value Ref Range   WBC 7.9 4.0 - 10.5 K/uL   RBC 3.44 (L) 3.87 - 5.11 MIL/uL   Hemoglobin 10.1 (L) 12.0 - 15.0 g/dL   HCT 16.1 (L) 09.6 - 04.5 %   MCV 91.6 78.0 - 100.0 fL   MCH 29.4 26.0 - 34.0 pg   MCHC 32.1 30.0 - 36.0 g/dL   RDW 40.9 81.1 - 91.4 %   Platelets 312 150 - 400 K/uL    Signed: [name], MD 12/25/2015, 2:08 PM    Services Ordered on Discharge:  Equipment Ordered on Discharge:

## 2015-12-23 NOTE — Progress Notes (Signed)
Internal Medicine Attending  Date: 12/23/2015  Patient name: Jamie Haas Medical record number: [id number] Date of birth: 14-Mar-1946 Age: 70 y.o. Gender: female  I saw and evaluated the patient. I reviewed the resident's note by Dr. Johnny Bridge and I agree with the resident's findings and plans as documented in her progress note.  Ms. Jamie Haas was without complaints when seen on rounds this morning and her blood pressure has been well controlled. She is scheduled for surgery of her left ankle fracture tomorrow.

## 2015-12-23 NOTE — Progress Notes (Signed)
CSW went to speak with patient regarding plans for discharge. Patient informed CSW that this will need to be discussed with her daughter. Patient reports they are interested in [facility] Placement. CSW began to further explore insurance, however was advised to wait to have discussion when daughter is around. CSW was provided permission to speak with daughter via telephone at [phone].   CSW attempted to get in contact with daughter via telephone to discuss plans for discharge, however received no answer. CSW will continue to follow up with patient and family to discuss plans.   Fernande Boyden, LCSWA Clinical Social Worker [facility] Ph: [phone]

## 2015-12-23 NOTE — Progress Notes (Addendum)
Surgery will be tomorrow.  I've made her NPO for midnight tonight. I've spoken to patient and she is agreeable.  Jamie Reel, MD [facility] [phone] 8:00 AM

## 2015-12-23 NOTE — Progress Notes (Signed)
OT Cancellation Note  Patient Details Name: Jamie Haas MRN: [id number] DOB: 06-30-1946   Cancelled Treatment:    Reason Eval/Treat Not Completed: Patient not medically ready. Pt continues on strict bed rest.  Evette Georges [phone] 12/23/2015, 7:30 AM

## 2015-12-23 NOTE — Progress Notes (Signed)
Subjective:  No acute events. Surgery will be tomorrow and pt aware   Objective: Vital signs in last 24 hours: Filed Vitals:   12/22/15 1441 12/22/15 1748 12/23/15 0025 12/23/15 0514  BP: 112/72 138/81 138/82 125/73  Pulse: 77 72 82 70  Temp: 98 F (36.7 C) 98.2 F (36.8 C) 99.5 F (37.5 C) 98.2 F (36.8 C)  TempSrc: Oral Oral Oral Oral  Resp: Height:      Weight:      SpO2: 98% 98% 82% 99%   Weight change:   Intake/Output Summary (Last 24 hours) at 12/23/15 1318 Last data filed at 12/23/15 1004  Gross per 24 hour  Intake    490 ml  Output   1175 ml  Net   -685 ml   General: Vital signs reviewed. Patient in no acute distress Cardiovascular: regular rate, rhythm,  Abdominal: Soft, non-tender,  BS + Extremities: no edema of feet   Lab Results: Results for orders placed or performed during the hospital encounter of 12/21/15 (from the past 24 hour(s))  Urinalysis, Routine w reflex microscopic (not at [facility])     Status: Abnormal   Collection Time: 12/23/15 12:30 AM  Result Value Ref Range   Color, Urine YELLOW YELLOW   APPearance CLOUDY (A) CLEAR   Specific Gravity, Urine 1.018 1.005 - 1.030   pH 6.0 5.0 - 8.0   Glucose, UA NEGATIVE NEGATIVE mg/dL   Hgb urine dipstick TRACE (A) NEGATIVE   Bilirubin Urine NEGATIVE NEGATIVE   Ketones, ur NEGATIVE NEGATIVE mg/dL   Protein, ur NEGATIVE NEGATIVE mg/dL   Nitrite NEGATIVE NEGATIVE   Leukocytes, UA LARGE (A) NEGATIVE  Urine microscopic-add on     Status: Abnormal   Collection Time: 12/23/15 12:30 AM  Result Value Ref Range   Squamous Epithelial / LPF 6-30 (A) NONE SEEN   WBC, UA TOO NUMEROUS TO COUNT 0 - 5 WBC/hpf   RBC / HPF 0-5 0 - 5 RBC/hpf   Bacteria, UA MANY (A) NONE SEEN   Trichomonas, UA PRESENT      Micro Results: Recent Results (from the past 240 hour(s))  Surgical pcr screen     Status: Abnormal   Collection Time: 12/21/15  9:52 PM  Result Value Ref Range Status   MRSA, PCR NEGATIVE  NEGATIVE Final   Staphylococcus aureus POSITIVE (A) NEGATIVE Final    Comment:        The Xpert SA Assay (FDA approved for NASAL specimens in patients over 29 years of age), is one component of a comprehensive surveillance program.  Test performance has been validated by [facility] for patients greater than or equal to 73 year old. It is not intended to diagnose infection nor to guide or monitor treatment.    Studies/Results: No results found. Medications: I have reviewed the patient's current medications. Scheduled Meds: . [START ON 12/24/2015]  ceFAZolin (ANCEF) IV  2 g Intravenous To OR  . Chlorhexidine Gluconate Cloth  6 each Topical Daily  . diclofenac sodium  2 g Topical QID  . heparin  5,000 Units Subcutaneous 3 times per day  . hydrocerin   Topical BID  . losartan  25 mg Oral Daily  . mirabegron ER  25 mg Oral Daily  . montelukast  10 mg Oral Daily  . mupirocin ointment  1 application Nasal BID  . nicotine  14 mg Transdermal Daily   Continuous Infusions:   PRN Meds:.HYDROcodone-acetaminophen, ondansetron **OR** ondansetron (ZOFRAN) IV, polyethylene  glycol Assessment/Plan: Active Problems:   Ankle fracture   Trimalleolar fracture of ankle, closed   Ankle fracture, bimalleolar, closed   Essential hypertension   Arthritis, senescent   Pressure ulcer  Left ankle medial and lateral malleolus fracture:  Orthopaedics consulted- surgery Wednesday.   -ortho following appreciate recs -HH diet, NPO after midnight for sx tomorrow -norco 5-325 q4 hours prn -zofran PRN -PTOT -CSW for SNF   HTN: -on home diovan (valsartan-hctz). MAP in 70s, BP stable   -losartan 25 mg daily   Stress incontinence -myrbetriq    Dispo: Disposition is deferred at this time, awaiting improvement of current medical problems.  Anticipated discharge in approximately 2 day(s).   The patient does have a current PCP (Renaye Rakers, MD) and does need an [facility] hospital follow-up appointment  after discharge.  The patient does not have transportation limitations that hinder transportation to clinic appointments.  .Services Needed at time of discharge: Y = Yes, Blank = No PT:   OT:   RN:   Equipment:   Other:     LOS: 2 days   Deneise Lever, MD 12/23/2015, 1:18 PM

## 2015-12-23 NOTE — Progress Notes (Signed)
PT Cancellation Note  Patient Details Name: Jamie Haas MRN: [id number] DOB: [birth year]   Cancelled Treatment:    Reason Eval/Treat Not Completed: Patient not medically ready   Surgery planned for tomorrow;   Will proceed with PT eval postop;   Thanks,  Van Clines, PT  Acute Rehabilitation Services Pager [phone] Office [phone]    Van Clines [facility] 12/23/2015, 10:45 AM

## 2015-12-24 ENCOUNTER — Inpatient Hospital Stay (HOSPITAL_COMMUNITY): Payer: Medicare Other | Admitting: Certified Registered Nurse Anesthetist

## 2015-12-24 ENCOUNTER — Encounter (HOSPITAL_COMMUNITY): Payer: Self-pay | Admitting: Certified Registered Nurse Anesthetist

## 2015-12-24 ENCOUNTER — Inpatient Hospital Stay (HOSPITAL_COMMUNITY): Payer: Medicare Other

## 2015-12-24 ENCOUNTER — Encounter (HOSPITAL_COMMUNITY)
Admission: EM | Disposition: A | Discharge status: Skilled Nursing Facility | Payer: Self-pay | Source: Home / Self Care | Attending: Internal Medicine

## 2015-12-24 DIAGNOSIS — Z9889 Other specified postprocedural states: Secondary | ICD-10-CM

## 2015-12-24 HISTORY — PX: ORIF ANKLE FRACTURE: SHX5408

## 2015-12-24 LAB — BASIC METABOLIC PANEL WITH GFR
Anion gap: 8 (ref 5–15)
BUN: 7 mg/dL (ref 6–20)
CO2: 25 mmol/L (ref 22–32)
Calcium: 9.5 mg/dL (ref 8.9–10.3)
Chloride: 110 mmol/L (ref 101–111)
Creatinine, Ser: 0.72 mg/dL (ref 0.44–1.00)
GFR calc Af Amer: >60 mL/min
GFR calc non Af Amer: >60 mL/min
Glucose, Bld: 106 mg/dL — ABNORMAL HIGH (ref 65–99)
Potassium: 4.4 mmol/L (ref 3.5–5.1)
Sodium: 143 mmol/L (ref 135–145)

## 2015-12-24 LAB — CBC
HEMATOCRIT: 35.3 % — AB (ref 36.0–46.0)
HEMOGLOBIN: 11.4 g/dL — AB (ref 12.0–15.0)
MCH: 29.5 pg (ref 26.0–34.0)
MCHC: 32.3 g/dL (ref 30.0–36.0)
MCV: 91.2 fL (ref 78.0–100.0)
Platelets: 353 10*3/uL (ref 150–400)
RBC: 3.87 MIL/uL (ref 3.87–5.11)
RDW: 14.5 % (ref 11.5–15.5)
WBC: 6.1 10*3/uL (ref 4.0–10.5)

## 2015-12-24 SURGERY — OPEN REDUCTION INTERNAL FIXATION (ORIF) ANKLE FRACTURE
Anesthesia: General | Laterality: Left

## 2015-12-24 MED ORDER — NEOSTIGMINE METHYLSULFATE 10 MG/10ML IV SOLN
INTRAVENOUS | Status: DC | PRN
  Administered 2015-12-24: 3 mg via INTRAVENOUS

## 2015-12-24 MED ORDER — HYDROMORPHONE HCL 1 MG/ML IJ SOLN
INTRAMUSCULAR | Status: AC
  Administered 2015-12-24: 0.25 mg via INTRAVENOUS
  Filled 2015-12-24: qty 1

## 2015-12-24 MED ORDER — ROCURONIUM BROMIDE 50 MG/5ML IV SOLN
INTRAVENOUS | Status: AC
  Filled 2015-12-24: qty 1

## 2015-12-24 MED ORDER — PROPOFOL 10 MG/ML IV BOLUS
INTRAVENOUS | Status: AC
  Filled 2015-12-24: qty 20

## 2015-12-24 MED ORDER — ARTIFICIAL TEARS OP OINT
TOPICAL_OINTMENT | OPHTHALMIC | Status: AC
  Filled 2015-12-24: qty 3.5

## 2015-12-24 MED ORDER — 0.9 % SODIUM CHLORIDE (POUR BTL) OPTIME
TOPICAL | Status: DC | PRN
  Administered 2015-12-24: 1000 mL

## 2015-12-24 MED ORDER — ONDANSETRON HCL 4 MG/2ML IJ SOLN: 4.0000 mg | Freq: Four times a day (QID) | INTRAMUSCULAR | Status: DC | PRN

## 2015-12-24 MED ORDER — HYDROMORPHONE HCL 1 MG/ML IJ SOLN
0.2500 mg | INTRAMUSCULAR | Status: DC | PRN
  Administered 2015-12-24 (×2): 0.25 mg via INTRAVENOUS

## 2015-12-24 MED ORDER — MAGNESIUM CITRATE PO SOLN: 1.0000 | Freq: Once | ORAL | Status: DC | PRN

## 2015-12-24 MED ORDER — SODIUM CHLORIDE 0.9 % IV SOLN
INTRAVENOUS | Status: DC
  Administered 2015-12-24 – 2015-12-25 (×3): via INTRAVENOUS

## 2015-12-24 MED ORDER — ONDANSETRON HCL 4 MG/2ML IJ SOLN
INTRAMUSCULAR | Status: DC | PRN
  Administered 2015-12-24: 4 mg via INTRAVENOUS

## 2015-12-24 MED ORDER — OXYCODONE HCL 5 MG PO TABS
5.0000 mg | ORAL_TABLET | ORAL | Status: DC | PRN
  Administered 2015-12-25 (×2): 10 mg via ORAL
  Filled 2015-12-24 (×2): qty 2

## 2015-12-24 MED ORDER — ACETAMINOPHEN 650 MG RE SUPP: 650.0000 mg | Freq: Four times a day (QID) | RECTAL | Status: DC | PRN

## 2015-12-24 MED ORDER — FENTANYL CITRATE (PF) 100 MCG/2ML IJ SOLN
25.0000 ug | Freq: Once | INTRAMUSCULAR | Status: AC
Start: 2015-12-24 — End: 2015-12-24
  Administered 2015-12-24: 25 ug via INTRAVENOUS

## 2015-12-24 MED ORDER — ENOXAPARIN SODIUM 40 MG/0.4ML ~~LOC~~ SOLN
40.0000 mg | SUBCUTANEOUS | Status: DC
  Administered 2015-12-25: 40 mg via SUBCUTANEOUS
  Filled 2015-12-24: qty 0.4

## 2015-12-24 MED ORDER — BUPIVACAINE-EPINEPHRINE (PF) 0.5% -1:200000 IJ SOLN
INTRAMUSCULAR | Status: DC | PRN
  Administered 2015-12-24: 25 mL

## 2015-12-24 MED ORDER — ROCURONIUM BROMIDE 100 MG/10ML IV SOLN
INTRAVENOUS | Status: DC | PRN
  Administered 2015-12-24: 40 mg via INTRAVENOUS

## 2015-12-24 MED ORDER — LIDOCAINE HCL (CARDIAC) 20 MG/ML IV SOLN
INTRAVENOUS | Status: DC | PRN
  Administered 2015-12-24: 80 mg via INTRAVENOUS

## 2015-12-24 MED ORDER — MIDAZOLAM HCL 2 MG/2ML IJ SOLN: 1.0000 mg | Freq: Once | INTRAMUSCULAR | Status: AC

## 2015-12-24 MED ORDER — OXYCODONE HCL 5 MG PO TABS: 5.0000 mg | ORAL_TABLET | ORAL | Status: DC | PRN

## 2015-12-24 MED ORDER — METHOCARBAMOL 500 MG PO TABS
500.0000 mg | ORAL_TABLET | Freq: Four times a day (QID) | ORAL | Status: DC | PRN
  Administered 2015-12-25 (×2): 500 mg via ORAL
  Filled 2015-12-24 (×2): qty 1

## 2015-12-24 MED ORDER — ONDANSETRON HCL 4 MG PO TABS: 4.0000 mg | ORAL_TABLET | Freq: Four times a day (QID) | ORAL | Status: DC | PRN

## 2015-12-24 MED ORDER — LACTATED RINGERS IV SOLN
Freq: Once | INTRAVENOUS | Status: AC
  Administered 2015-12-24: 12:00:00 via INTRAVENOUS

## 2015-12-24 MED ORDER — ESMOLOL HCL 100 MG/10ML IV SOLN
INTRAVENOUS | Status: DC | PRN
  Administered 2015-12-24: 30 mg via INTRAVENOUS
  Administered 2015-12-24: 20 mg via INTRAVENOUS

## 2015-12-24 MED ORDER — PROMETHAZINE HCL 25 MG/ML IJ SOLN: 6.2500 mg | INTRAMUSCULAR | Status: DC | PRN

## 2015-12-24 MED ORDER — METOCLOPRAMIDE HCL 5 MG/ML IJ SOLN: 5.0000 mg | Freq: Three times a day (TID) | INTRAMUSCULAR | Status: DC | PRN

## 2015-12-24 MED ORDER — OXYCODONE HCL ER 10 MG PO T12A
10.0000 mg | EXTENDED_RELEASE_TABLET | Freq: Two times a day (BID) | ORAL | Status: DC
  Administered 2015-12-24: 10 mg via ORAL
  Filled 2015-12-24: qty 1

## 2015-12-24 MED ORDER — FENTANYL CITRATE (PF) 100 MCG/2ML IJ SOLN
INTRAMUSCULAR | Status: DC | PRN
  Administered 2015-12-24 (×5): 50 ug via INTRAVENOUS

## 2015-12-24 MED ORDER — POLYETHYLENE GLYCOL 3350 17 G PO PACK: 17.0000 g | PACK | Freq: Every day | ORAL | Status: DC | PRN

## 2015-12-24 MED ORDER — FENTANYL CITRATE (PF) 250 MCG/5ML IJ SOLN
INTRAMUSCULAR | Status: AC
  Filled 2015-12-24: qty 5

## 2015-12-24 MED ORDER — ACETAMINOPHEN 325 MG PO TABS: 650.0000 mg | ORAL_TABLET | Freq: Four times a day (QID) | ORAL | Status: DC | PRN

## 2015-12-24 MED ORDER — ONDANSETRON HCL 4 MG/2ML IJ SOLN
INTRAMUSCULAR | Status: AC
  Filled 2015-12-24: qty 2

## 2015-12-24 MED ORDER — MORPHINE SULFATE (PF) 2 MG/ML IV SOLN: 1.0000 mg | INTRAVENOUS | Status: DC | PRN

## 2015-12-24 MED ORDER — SORBITOL 70 % SOLN
30.0000 mL | Freq: Every day | Status: DC | PRN
  Filled 2015-12-24: qty 30

## 2015-12-24 MED ORDER — GLYCOPYRROLATE 0.2 MG/ML IJ SOLN
INTRAMUSCULAR | Status: DC | PRN
  Administered 2015-12-24: 0.6 mg via INTRAVENOUS

## 2015-12-24 MED ORDER — MIDAZOLAM HCL 2 MG/2ML IJ SOLN
INTRAMUSCULAR | Status: AC
  Administered 2015-12-24: 1 mg
  Filled 2015-12-24: qty 2

## 2015-12-24 MED ORDER — DIPHENHYDRAMINE HCL 12.5 MG/5ML PO ELIX
25.0000 mg | ORAL_SOLUTION | ORAL | Status: DC | PRN
  Administered 2015-12-25: 25 mg via ORAL
  Filled 2015-12-24: qty 10

## 2015-12-24 MED ORDER — METOCLOPRAMIDE HCL 5 MG PO TABS: 5.0000 mg | ORAL_TABLET | Freq: Three times a day (TID) | ORAL | Status: DC | PRN

## 2015-12-24 MED ORDER — METHOCARBAMOL 1000 MG/10ML IJ SOLN: 500.0000 mg | Freq: Four times a day (QID) | INTRAVENOUS | Status: DC | PRN

## 2015-12-24 MED ORDER — POLYETHYLENE GLYCOL 3350 17 G PO PACK
17.0000 g | PACK | Freq: Once | ORAL | Status: AC
  Administered 2015-12-24: 17 g via ORAL
  Filled 2015-12-24: qty 1

## 2015-12-24 MED ORDER — CEFAZOLIN SODIUM-DEXTROSE 2-3 GM-% IV SOLR
2.0000 g | Freq: Four times a day (QID) | INTRAVENOUS | Status: AC
  Administered 2015-12-24 – 2015-12-25 (×3): 2 g via INTRAVENOUS
  Filled 2015-12-24 (×5): qty 50

## 2015-12-24 MED ORDER — METOPROLOL TARTRATE 1 MG/ML IV SOLN
INTRAVENOUS | Status: DC | PRN
  Administered 2015-12-24 (×2): 2.5 mg via INTRAVENOUS

## 2015-12-24 MED ORDER — LACTATED RINGERS IV SOLN
INTRAVENOUS | Status: DC | PRN
  Administered 2015-12-24 (×2): via INTRAVENOUS

## 2015-12-24 MED ORDER — SUGAMMADEX SODIUM 500 MG/5ML IV SOLN
INTRAVENOUS | Status: AC
  Filled 2015-12-24: qty 5

## 2015-12-24 MED ORDER — FENTANYL CITRATE (PF) 100 MCG/2ML IJ SOLN
INTRAMUSCULAR | Status: AC
  Administered 2015-12-24: 25 ug via INTRAVENOUS
  Filled 2015-12-24: qty 2

## 2015-12-24 MED ORDER — PROPOFOL 10 MG/ML IV BOLUS
INTRAVENOUS | Status: DC | PRN
  Administered 2015-12-24: 110 mg via INTRAVENOUS

## 2015-12-24 MED ORDER — MIDAZOLAM HCL 2 MG/2ML IJ SOLN
INTRAMUSCULAR | Status: AC
  Filled 2015-12-24: qty 2

## 2015-12-24 MED ORDER — ENOXAPARIN SODIUM 40 MG/0.4ML ~~LOC~~ SOLN: 40.0000 mg | Freq: Every day | SUBCUTANEOUS | Status: DC

## 2015-12-24 MED ORDER — MIDAZOLAM HCL 5 MG/5ML IJ SOLN
INTRAMUSCULAR | Status: DC | PRN
  Administered 2015-12-24 (×2): 1 mg via INTRAVENOUS

## 2015-12-24 SURGICAL SUPPLY — 72 items
BANDAGE ACE 4X5 VEL STRL LF (GAUZE/BANDAGES/DRESSINGS) ×3 IMPLANT
BANDAGE ACE 6X5 VEL STRL LF (GAUZE/BANDAGES/DRESSINGS) ×3 IMPLANT
BANDAGE ELASTIC 4 VELCRO ST LF (GAUZE/BANDAGES/DRESSINGS) IMPLANT
BANDAGE ELASTIC 6 VELCRO ST LF (GAUZE/BANDAGES/DRESSINGS) IMPLANT
BANDAGE ESMARK 6X9 LF (GAUZE/BANDAGES/DRESSINGS) IMPLANT
BLADE SURG 15 STRL LF DISP TIS (BLADE) ×1 IMPLANT
BLADE SURG 15 STRL SS (BLADE) ×2
BNDG COHESIVE 4X5 TAN STRL (GAUZE/BANDAGES/DRESSINGS) ×3 IMPLANT
BNDG COHESIVE 6X5 TAN STRL LF (GAUZE/BANDAGES/DRESSINGS) IMPLANT
BNDG ESMARK 6X9 LF (GAUZE/BANDAGES/DRESSINGS)
CANISTER SUCT 3000ML PPV (MISCELLANEOUS) ×3 IMPLANT
COVER SURGICAL LIGHT HANDLE (MISCELLANEOUS) ×3 IMPLANT
CUFF TOURNIQUET SINGLE 34IN LL (TOURNIQUET CUFF) ×3 IMPLANT
CUFF TOURNIQUET SINGLE 44IN (TOURNIQUET CUFF) IMPLANT
DRAPE C-ARM 42X72 X-RAY (DRAPES) ×3 IMPLANT
DRAPE C-ARMOR (DRAPES) ×3 IMPLANT
DRAPE IMP U-DRAPE 54X76 (DRAPES) ×3 IMPLANT
DRAPE INCISE IOBAN 66X45 STRL (DRAPES) IMPLANT
DRAPE U-SHAPE 47X51 STRL (DRAPES) ×3 IMPLANT
DRILL 2.6X122MM WL AO SHAFT (BIT) ×3 IMPLANT
DURAPREP 26ML APPLICATOR (WOUND CARE) ×3 IMPLANT
ELECT CAUTERY BLADE 6.4 (BLADE) ×3 IMPLANT
ELECT REM PT RETURN 9FT ADLT (ELECTROSURGICAL) ×3
ELECTRODE REM PT RTRN 9FT ADLT (ELECTROSURGICAL) ×1 IMPLANT
FACESHIELD WRAPAROUND (MASK) ×3 IMPLANT
GAUZE SPONGE 4X4 12PLY STRL (GAUZE/BANDAGES/DRESSINGS) ×3 IMPLANT
GAUZE XEROFORM 5X9 LF (GAUZE/BANDAGES/DRESSINGS) ×3 IMPLANT
GLOVE SKINSENSE NS SZ7.5 (GLOVE) ×2
GLOVE SKINSENSE STRL SZ7.5 (GLOVE) ×1 IMPLANT
GLOVE SURG SYN 7.5  E (GLOVE) ×4
GLOVE SURG SYN 7.5 E (GLOVE) ×2 IMPLANT
GOWN STRL REIN XL XLG (GOWN DISPOSABLE) ×3 IMPLANT
K-WIRE ORTHOPEDIC 1.4X150L (WIRE) ×9
KIT BASIN OR (CUSTOM PROCEDURE TRAY) ×3 IMPLANT
KIT ROOM TURNOVER OR (KITS) ×3 IMPLANT
KWIRE ORTHOPEDIC 1.4X150L (WIRE) ×3 IMPLANT
NEEDLE HYPO 25GX1X1/2 BEV (NEEDLE) IMPLANT
NS IRRIG 1000ML POUR BTL (IV SOLUTION) ×3 IMPLANT
PACK ORTHO EXTREMITY (CUSTOM PROCEDURE TRAY) ×3 IMPLANT
PAD ABD 8X10 STRL (GAUZE/BANDAGES/DRESSINGS) ×3 IMPLANT
PAD ARMBOARD 7.5X6 YLW CONV (MISCELLANEOUS) ×6 IMPLANT
PAD CAST 3X4 CTTN HI CHSV (CAST SUPPLIES) ×2 IMPLANT
PADDING CAST ABS 6INX4YD NS (CAST SUPPLIES) ×2
PADDING CAST ABS COTTON 6X4 NS (CAST SUPPLIES) ×1 IMPLANT
PADDING CAST COTTON 3X4 STRL (CAST SUPPLIES) ×4
PADDING CAST COTTON 6X4 STRL (CAST SUPPLIES) ×3 IMPLANT
PLATE 7H 125MM (Plate) ×3 IMPLANT
SCREW BONE 3.5X16MM (Screw) ×3 IMPLANT
SCREW BONE NON-LCKING 3.5X12MM (Screw) ×9 IMPLANT
SCREW CANNULATED 4.0X36MM FT (Screw) ×3 IMPLANT
SCREW CANNULATED 4.0X36MM PT (Screw) ×3 IMPLANT
SCREW CANNULATED 4.0X70MM (Screw) ×3 IMPLANT
SCREW LOCK 3.5X14 (Screw) ×6 IMPLANT
SCREW LOCKING 3.5X16MM (Screw) ×3 IMPLANT
SCREW NL 3.5X42MM (Screw) ×3 IMPLANT
SPONGE GAUZE 4X4 12PLY STER LF (GAUZE/BANDAGES/DRESSINGS) ×3 IMPLANT
SPONGE LAP 18X18 X RAY DECT (DISPOSABLE) IMPLANT
SUCTION FRAZIER HANDLE 10FR (MISCELLANEOUS) ×2
SUCTION FRAZIER TIP 10 FR DISP (SUCTIONS) ×3 IMPLANT
SUCTION TUBE FRAZIER 10FR DISP (MISCELLANEOUS) ×1 IMPLANT
SUT ETHILON 3 0 PS 1 (SUTURE) ×6 IMPLANT
SUT MNCRL AB 3-0 PS2 18 (SUTURE) IMPLANT
SUT VIC AB 0 CT1 27 (SUTURE) ×2
SUT VIC AB 0 CT1 27XBRD ANBCTR (SUTURE) ×1 IMPLANT
SUT VIC AB 2-0 CT1 27 (SUTURE) ×2
SUT VIC AB 2-0 CT1 TAPERPNT 27 (SUTURE) ×1 IMPLANT
SYR CONTROL 10ML LL (SYRINGE) IMPLANT
TOWEL OR 17X24 6PK STRL BLUE (TOWEL DISPOSABLE) ×3 IMPLANT
TOWEL OR 17X26 10 PK STRL BLUE (TOWEL DISPOSABLE) ×6 IMPLANT
TUBE CONNECTING 12'X1/4 (SUCTIONS) ×1
TUBE CONNECTING 12X1/4 (SUCTIONS) ×2 IMPLANT
WATER STERILE IRR 1000ML POUR (IV SOLUTION) ×3 IMPLANT

## 2015-12-24 NOTE — H&P (Signed)
H&P update  The surgical history has been reviewed and remains accurate without interval change.  The patient was re-examined and patient's physiologic condition has not changed significantly in the last 30 days. The condition still exists that makes this procedure necessary. The treatment plan remains the same, without new options for care.  No new pharmacological allergies or types of therapy has been initiated that would change the plan or the appropriateness of the plan.  The patient and/or family understand the potential benefits and risks.  Mayra Reel, MD 12/24/2015 7:27 AM

## 2015-12-24 NOTE — Progress Notes (Signed)
Orthopedic Tech Progress Note Patient Details:  Jamie Haas 12/15/1945 [id number]  Ortho Devices Type of Ortho Device: Ace wrap, Post (short leg) splint Ortho Device/Splint Location: applied ohf to bed Ortho Device/Splint Interventions: Ordered, Application   Jennye Moccasin 12/24/2015, 7:23 PM

## 2015-12-24 NOTE — Anesthesia Postprocedure Evaluation (Signed)
Anesthesia Post Note  Patient: Jamie Haas  Procedure(s) Performed: Procedure(s) (LRB): OPEN REDUCTION INTERNAL FIXATION (ORIF) ANKLE FRACTURE (Left)  Patient location during evaluation: PACU Anesthesia Type: General Level of consciousness: sedated Pain management: pain level controlled Vital Signs Assessment: post-procedure vital signs reviewed and stable Respiratory status: spontaneous breathing and respiratory function stable Cardiovascular status: stable Anesthetic complications: no    Last Vitals:  Filed Vitals:   12/24/15 1647 12/24/15 1658  BP:  118/79  Pulse:  76  Temp: 36.7 C 36.6 C  Resp:      Last Pain:  Filed Vitals:   12/24/15 1659  PainSc: 3                  Ireanna Finlayson DANIEL

## 2015-12-24 NOTE — Anesthesia Procedure Notes (Addendum)
Anesthesia Regional Block:  Popliteal block  Pre-Anesthetic Checklist: ,, timeout performed, Correct Patient, Correct Site, Correct Laterality, Correct Procedure, Correct Position, site marked, Risks and benefits discussed, Surgical consent,  Pre-op evaluation,  Post-op pain management  Laterality: Left and Lower  Prep: chloraprep       Needles:   Needle Type: Echogenic Needle     Needle Length: 10cm 10 cm Needle Gauge: 21 and 21 G    Additional Needles:  Procedures: ultrasound guided (picture in chart) Popliteal block Narrative:  Injection made incrementally with aspirations every 5 mL.  Performed by: Personally  Anesthesiologist: Sherrian Divers  Additional Notes: No pain and no increased resistance to injection. Meaningful verbal contact maintained throughout block placement.   Procedure Name: Intubation Date/Time: 12/24/2015 1:17 PM Performed by: Margaree Mackintosh Pre-anesthesia Checklist: Patient identified, Emergency Drugs available, Suction available, Patient being monitored and Timeout performed Patient Re-evaluated:Patient Re-evaluated prior to inductionOxygen Delivery Method: Circle system utilized Preoxygenation: Pre-oxygenation with 100% oxygen Intubation Type: IV induction Ventilation: Mask ventilation without difficulty Laryngoscope Size: Mac and 3 Grade View: Grade I Tube type: Oral Tube size: 7.0 mm Number of attempts: 1 Airway Equipment and Method: Stylet Placement Confirmation: ETT inserted through vocal cords under direct vision,  positive ETCO2 and breath sounds checked- equal and bilateral Secured at: 20 cm Tube secured with: Tape Dental Injury: Teeth and Oropharynx as per pre-operative assessment

## 2015-12-24 NOTE — Progress Notes (Signed)
Patient to OR via bed. Tearful. Emotional support given. IV saline locked for travel.

## 2015-12-24 NOTE — Progress Notes (Signed)
Patient returned to [facility] room 32. S/P left ankle fracture repair. Splint cast with ace wrap intact. Denies pain. Family at bedside.

## 2015-12-24 NOTE — Anesthesia Preprocedure Evaluation (Addendum)
Anesthesia Evaluation  Patient identified by MRN, date of birth, ID band Patient awake    Reviewed: Allergy & Precautions, NPO status , Patient's Chart, lab work & pertinent test results  Airway Mallampati: II  TM Distance: >3 FB Neck ROM: Full    Dental no notable dental hx.    Pulmonary Current Smoker,    Pulmonary exam normal breath sounds clear to auscultation       Cardiovascular hypertension, Pt. on medications Normal cardiovascular exam Rhythm:Regular Rate:Normal     Neuro/Psych CVA negative psych ROS   GI/Hepatic negative GI ROS, Neg liver ROS,   Endo/Other  negative endocrine ROS  Renal/GU negative Renal ROS  negative genitourinary   Musculoskeletal  (+) Arthritis ,   Abdominal (+) + obese,   Peds negative pediatric ROS (+)  Hematology negative hematology ROS (+)   Anesthesia Other Findings   Reproductive/Obstetrics negative OB ROS                            Anesthesia Physical Anesthesia Plan  ASA: III  Anesthesia Plan: General   Post-op Pain Management: GA combined w/ Regional for post-op pain   Induction: Intravenous  Airway Management Planned: Oral ETT  Additional Equipment:   Intra-op Plan:   Post-operative Plan: Extubation in OR  Informed Consent: I have reviewed the patients History and Physical, chart, labs and discussed the procedure including the risks, benefits and alternatives for the proposed anesthesia with the patient or authorized representative who has indicated his/her understanding and acceptance.   Dental advisory given  Plan Discussed with: CRNA  Anesthesia Plan Comments: (Discussed r/b popliteal block. Questions answered and she consents to block.)       Anesthesia Quick Evaluation

## 2015-12-24 NOTE — Progress Notes (Signed)
Internal Medicine Attending  Date: 12/24/2015  Patient name: Jamie Haas Medical record number: [id number] Date of birth: Mar 26, 1946 Age: 70 y.o. Gender: female  I saw and evaluated the patient. I reviewed the resident's note by Dr. Ladona Ridgel and I agree with the resident's findings and plans as documented in his progress note.  Jamie Haas was seen preoperatively this morning and was without complaints. She has subsequently undergone surgical repair of her left ankle fracture. At this point we will work on pain control and placement to a skilled nursing facility. She is to remain nonweightbearing on the ankle for a period of 6 weeks. I anticipate she will be ready for discharge within the next 24-48 hours.

## 2015-12-24 NOTE — Op Note (Signed)
   Date of Surgery: 12/24/2015  INDICATIONS: Ms. Raether is a 70 y.o.-year-old female who sustained a left ankle fracture two weeks ago and presented to the ER will trouble ambulating and difficulty with ADLs at home; she was indicated for open reduction and internal fixation due to the displaced nature of the articular fracture and came to the operating room today for this procedure. The patient did consent to the procedure after discussion of the risks and benefits.  PREOPERATIVE DIAGNOSIS: left trimalleolar ankle fracture with syndesmotic injury  POSTOPERATIVE DIAGNOSIS: Same.  PROCEDURE:  1. Open treatment of left ankle fracture with internal fixation.Trimalleolar w/o fixation of posterior malleolus CPT 27822.  2. Open treatment internal fixation of left syndesmosis.  SURGEON: N. Glee Arvin, M.D.  ASSIST: April Chilton Si, RNFA.  ANESTHESIA:  general, regional  TOURNIQUET TIME: less than 90 mins  IV FLUIDS AND URINE: See anesthesia.  ESTIMATED BLOOD LOSS: minimal mL.  IMPLANTS: [employer] Variax 7 hole fibula plate  COMPLICATIONS: None.  DESCRIPTION OF PROCEDURE: The patient was brought to the operating room and placed supine on the operating table.  The patient had been signed prior to the procedure and this was documented. The patient had the anesthesia placed by the anesthesiologist.  A nonsterile tourniquet was placed on the upper thigh.  The prep verification and incision time-outs were performed to confirm that this was the correct patient, site, side and location. The patient had an SCD on the opposite lower extremity. The patient did receive antibiotics prior to the incision and was re-dosed during the procedure as needed at indicated intervals.  The patient had the lower extremity prepped and draped in the standard surgical fashion.  The extremity was exsanguinated using an esmarch bandage and the tourniquet was inflated to 300 mm Hg.  Lateral incision was created over the distal  fibula. Full-thickness flaps were elevated. The fracture was exposed. Organized hematoma and entrapped periosteum and immature callus were removed from the fracture site in order to mobilize the fracture. We had to first make a separate incision over the medial malleolus fracture and unhinge that fracture in order to fully reduce the fibular fracture. What once we accomplished this we were able to achieve reduction of the fibula fracture and this was pinned provisionally. We then placed a 7 hole fibular plate and placed locking and nonlocking screws. Of note the bone quality was extremely poor. Once we fixed the fibula fracture we then turned our attention to the medial side. The fracture was reduced and provisionally pinned with 2 parallel K wires. Given the quality of the bone I did use a 70 mm 4.0 mm cancellus screw to achieve bite from the far cortex in order to compress across the fracture. I then placed a 36 mm fully threaded 4.0 mm cancellous screw. The syndesmosis was then stressed and showed that there was widening of the medial clear space. We then placed a tricortical syndesmotic screw parallel to the ankle joint under fluoroscopic guidance. Final x-rays were taken. Wounds were thoroughly irrigated and closed in layer fashion using 0 Vicryl, 2-0 Vicryl, 3-0 nylon. Sterile dressings were applied. Foot was immobilized in a short-leg splint. Patient tolerated procedure well and no immediate complications.  Postoperative plan: Patient will be nonweightbearing to the left lower extremity for approximately 6 weeks. She will need placement in a skilled nursing facility.  Mayra Reel, MD [facility] [phone] 2:56 PM

## 2015-12-24 NOTE — Progress Notes (Signed)
OT Cancellation Note  Patient Details Name: ANTHEA UDOVICH MRN: [id number] DOB: [birth year]   Cancelled Treatment:    Reason Eval/Treat Not Completed: Patient at procedure or test/ unavailable - Surgery scheduled for today. Will proceed with OT eval postop.  Nils Pyle, OTR/L Pager: [phone] 12/24/2015, 7:59 AM

## 2015-12-24 NOTE — Clinical Documentation Improvement (Signed)
Internal Medicine  Can the diagnosis of pressure ulcer be further specified?   Document if pressure ulcer with stage is Present on Admission   Document Site with laterality - Elbow, Back (upper/lower), Sacral, Hip, Buttock, Ankle, Heel, Head, Other (Specify)  Pressure Ulcer Stage - Stage1, Stage 2, Stage 3, Stage 4, Unstageable, Unspecified, Unable to Clinically Determine  Other  Clinically Undetermined  Please update your documentation within the medical record to reflect your response to this query. Thank you.  Supporting Information:(As per notes) "Pressure Ulcer" is document as an "Active Problem"  Please exercise your independent, professional judgment when responding. A specific answer is not anticipated or expected.  Thank You, Nevin Bloodgood, RN, BSN, CCDS,Clinical Documentation Specialist:  [phone]  [phone]=[facility] [place]- Health Information Management

## 2015-12-24 NOTE — Transfer of Care (Signed)
Immediate Anesthesia Transfer of Care Note  Patient: Jamie Haas  Procedure(s) Performed: Procedure(s): OPEN REDUCTION INTERNAL FIXATION (ORIF) ANKLE FRACTURE (Left)  Patient Location: PACU  Anesthesia Type:General  Level of Consciousness: awake, alert  and oriented  Airway & Oxygen Therapy: Patient Spontanous Breathing and Patient connected to nasal cannula oxygen  Post-op Assessment: Report given to RN and Post -op Vital signs reviewed and stable  Post vital signs: stable  Last Vitals:  Filed Vitals:   12/24/15 1235 12/24/15 1240  BP: 132/58 130/56  Pulse: 80 80  Temp:    Resp: 23 24    Complications: No apparent anesthesia complications

## 2015-12-24 NOTE — Discharge Instructions (Signed)
° ° °  1. Keep splint clean and dry °2. Elevate foot above level of the heart °3. Take lovenox to prevent blood clots °4. Take pain meds as needed °5. Strict non weight bearing to operative extremity ° °

## 2015-12-24 NOTE — Progress Notes (Signed)
Subjective:  Jamie Haas. Patient is aware she is going to surgery, and asked to ensure she was getting general anesthesia.  Her pain is currently 0, on Vicodin 10-650 BID.  Her last BM was Sunday.    Objective: Vital signs in last 24 hours: Filed Vitals:   12/24/15 1225 12/24/15 1230 12/24/15 1235 12/24/15 1240  BP: 134/60 150/75 132/58 130/56  Pulse: 69 71 80 80  Temp:      TempSrc:      Resp: Height:      Weight:      SpO2: 100% 100% 100% 100%   Weight change:   Intake/Output Summary (Last 24 hours) at 12/24/15 1308 Last data filed at 12/24/15 0600  Gross per 24 hour  Intake   1000 ml  Output   1850 ml  Net   -850 ml   Physical Exam  Constitutional: She is oriented to person, place, and time and well-developed, well-nourished, and in no distress. No distress.  HENT:  Head: Normocephalic and atraumatic.  Eyes: EOM are normal.  Neck: No tracheal deviation present.  Cardiovascular: Normal rate, regular rhythm and normal heart sounds.   Pulmonary/Chest: Effort normal and breath sounds normal. No stridor. No respiratory distress. She has no wheezes.  Abdominal: Soft. She exhibits no distension. There is no tenderness. There is no rebound and no guarding.  Palpable stool  Musculoskeletal:  LLE bandaged and immobilized.  Patient able to feel light touch and move toes.  Neurological: She is alert and oriented to person, place, and time.  Skin: Skin is warm and dry. She is not diaphoretic.    Lab Results: Results for orders placed or performed during the hospital encounter of 12/21/15 (from the past 24 hour(s))  CBC     Status: Abnormal   Collection Time: 12/24/15  8:00 AM  Result Value Ref Range   WBC 6.1 4.0 - 10.5 K/uL   RBC 3.87 3.87 - 5.11 MIL/uL   Hemoglobin 11.4 (L) 12.0 - 15.0 g/dL   HCT 16.1 (L) 09.6 - 04.5 %   MCV 91.2 78.0 - 100.0 fL   MCH 29.5 26.0 - 34.0 pg   MCHC 32.3 30.0 - 36.0 g/dL   RDW 40.9 81.1 - 91.4 %   Platelets 353 150 - 400 K/uL    Basic metabolic panel     Status: Abnormal   Collection Time: 12/24/15  8:00 AM  Result Value Ref Range   Sodium 143 135 - 145 mmol/L   Potassium 4.4 3.5 - 5.1 mmol/L   Chloride 110 101 - 111 mmol/L   CO2 25 22 - 32 mmol/L   Glucose, Bld 106 (H) 65 - 99 mg/dL   BUN 7 6 - 20 mg/dL   Creatinine, Ser 7.82 0.44 - 1.00 mg/dL   Calcium 9.5 8.9 - 95.6 mg/dL   GFR calc non Af Amer >60 >60 mL/min   GFR calc Af Amer >60 >60 mL/min   Anion gap 8 5 - 15     Micro Results: Recent Results (from the past 240 hour(s))  Surgical pcr screen     Status: Abnormal   Collection Time: 12/21/15  9:52 PM  Result Value Ref Range Status   MRSA, PCR NEGATIVE NEGATIVE Final   Staphylococcus aureus POSITIVE (A) NEGATIVE Final    Comment:        The Xpert SA Assay (FDA approved for NASAL specimens in patients over 8 years of age), is one component of a  comprehensive surveillance program.  Test performance has been validated by [facility] for patients greater than or equal to 33 year old. It is not intended to diagnose infection nor to guide or monitor treatment.    Studies/Results: No results found. Medications: I have reviewed the patient's current medications. Scheduled Meds: . [MAR Hold]  ceFAZolin (ANCEF) IV  2 g Intravenous To OR  . [MAR Hold] Chlorhexidine Gluconate Cloth  6 each Topical Daily  . [MAR Hold] diclofenac sodium  2 g Topical QID  . [MAR Hold] hydrocerin   Topical BID  . [MAR Hold] losartan  25 mg Oral Daily  . [MAR Hold] mirabegron ER  25 mg Oral Daily  . [MAR Hold] montelukast  10 mg Oral Daily  . [MAR Hold] mupirocin ointment  1 application Nasal BID  . [MAR Hold] nicotine  14 mg Transdermal Daily  . 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  Left Bimalleolar Ankle Fractrue: Ortho following, with surgery 2/15. - Ortho following appreciate recs - HH diet after surgery - Norco 5-325 q4 hours prn - Zofran PRN - PT/OT - CSW for SNF  HTN: Home regimen is Diovan (valsartan-hctz). MAP in 70s, BP stable  - Losartan 25 mg daily   Stress incontinence - Myrbetriq   Dispo: Disposition is deferred at this time, awaiting improvement of current medical problems.  Anticipated discharge in approximately 2 day(s).   The patient does have a current PCP (Renaye Rakers, MD) and does need an [facility] hospital follow-up appointment after discharge.  The patient does not have transportation limitations that hinder transportation to clinic appointments.  .Services Needed at time of discharge: Y = Yes, Blank = No PT:   OT:   RN:   Equipment:   Other:     LOS: 3 days   Jamie Half, MD 12/24/2015, 1:08 PM

## 2015-12-25 ENCOUNTER — Encounter (HOSPITAL_COMMUNITY): Payer: Self-pay | Admitting: Orthopaedic Surgery

## 2015-12-25 DIAGNOSIS — W108XXD Fall (on) (from) other stairs and steps, subsequent encounter: Secondary | ICD-10-CM | POA: Diagnosis not present

## 2015-12-25 DIAGNOSIS — M6281 Muscle weakness (generalized): Secondary | ICD-10-CM | POA: Diagnosis not present

## 2015-12-25 DIAGNOSIS — M1990 Unspecified osteoarthritis, unspecified site: Secondary | ICD-10-CM | POA: Diagnosis not present

## 2015-12-25 DIAGNOSIS — S82843S Displaced bimalleolar fracture of unspecified lower leg, sequela: Secondary | ICD-10-CM | POA: Diagnosis not present

## 2015-12-25 DIAGNOSIS — S93432D Sprain of tibiofibular ligament of left ankle, subsequent encounter: Secondary | ICD-10-CM | POA: Diagnosis not present

## 2015-12-25 DIAGNOSIS — S82852D Displaced trimalleolar fracture of left lower leg, subsequent encounter for closed fracture with routine healing: Secondary | ICD-10-CM | POA: Diagnosis not present

## 2015-12-25 DIAGNOSIS — N3281 Overactive bladder: Secondary | ICD-10-CM | POA: Diagnosis not present

## 2015-12-25 DIAGNOSIS — S82842D Displaced bimalleolar fracture of left lower leg, subsequent encounter for closed fracture with routine healing: Secondary | ICD-10-CM | POA: Diagnosis not present

## 2015-12-25 DIAGNOSIS — R1084 Generalized abdominal pain: Secondary | ICD-10-CM | POA: Diagnosis not present

## 2015-12-25 DIAGNOSIS — G4700 Insomnia, unspecified: Secondary | ICD-10-CM | POA: Diagnosis not present

## 2015-12-25 DIAGNOSIS — S82842G Displaced bimalleolar fracture of left lower leg, subsequent encounter for closed fracture with delayed healing: Secondary | ICD-10-CM | POA: Diagnosis not present

## 2015-12-25 DIAGNOSIS — I639 Cerebral infarction, unspecified: Secondary | ICD-10-CM | POA: Diagnosis not present

## 2015-12-25 DIAGNOSIS — S92909A Unspecified fracture of unspecified foot, initial encounter for closed fracture: Secondary | ICD-10-CM | POA: Diagnosis not present

## 2015-12-25 DIAGNOSIS — Z9889 Other specified postprocedural states: Secondary | ICD-10-CM | POA: Diagnosis not present

## 2015-12-25 DIAGNOSIS — I10 Essential (primary) hypertension: Secondary | ICD-10-CM | POA: Diagnosis not present

## 2015-12-25 DIAGNOSIS — N611 Abscess of the breast and nipple: Secondary | ICD-10-CM | POA: Diagnosis not present

## 2015-12-25 DIAGNOSIS — Z9181 History of falling: Secondary | ICD-10-CM | POA: Diagnosis not present

## 2015-12-25 LAB — CBC
HEMATOCRIT: 31.5 % — AB (ref 36.0–46.0)
Hemoglobin: 10.1 g/dL — ABNORMAL LOW (ref 12.0–15.0)
MCH: 29.4 pg (ref 26.0–34.0)
MCHC: 32.1 g/dL (ref 30.0–36.0)
MCV: 91.6 fL (ref 78.0–100.0)
PLATELETS: 312 10*3/uL (ref 150–400)
RBC: 3.44 MIL/uL — ABNORMAL LOW (ref 3.87–5.11)
RDW: 14.2 % (ref 11.5–15.5)
WBC: 7.9 10*3/uL (ref 4.0–10.5)

## 2015-12-25 MED ORDER — POLYETHYLENE GLYCOL 3350 17 G PO PACK: 17.0000 g | PACK | Freq: Every day | ORAL | Status: DC

## 2015-12-25 MED ORDER — DICLOFENAC SODIUM 1 % TD GEL: 2.0000 g | Freq: Four times a day (QID) | TRANSDERMAL | Status: AC

## 2015-12-25 MED ORDER — METHOCARBAMOL 500 MG PO TABS: 500.0000 mg | ORAL_TABLET | Freq: Four times a day (QID) | ORAL | Status: DC | PRN

## 2015-12-25 MED ORDER — NICOTINE 14 MG/24HR TD PT24: 14.0000 mg | MEDICATED_PATCH | Freq: Every day | TRANSDERMAL | Status: DC

## 2015-12-25 MED ORDER — POLYETHYLENE GLYCOL 3350 17 G PO PACK: 17.0000 g | PACK | Freq: Two times a day (BID) | ORAL | Status: DC

## 2015-12-25 MED ORDER — POLYETHYLENE GLYCOL 3350 17 G PO PACK
17.0000 g | PACK | Freq: Every day | ORAL | Status: DC
  Administered 2015-12-25: 17 g via ORAL
  Filled 2015-12-25: qty 1

## 2015-12-25 MED ORDER — MORPHINE SULFATE (PF) 2 MG/ML IV SOLN: 2.0000 mg | INTRAVENOUS | Status: DC | PRN

## 2015-12-25 MED ORDER — ENOXAPARIN SODIUM 40 MG/0.4ML ~~LOC~~ SOLN: 40.0000 mg | Freq: Every day | SUBCUTANEOUS | Status: DC

## 2015-12-25 MED ORDER — SENNOSIDES-DOCUSATE SODIUM 8.6-50 MG PO TABS
2.0000 | ORAL_TABLET | Freq: Two times a day (BID) | ORAL | Status: DC
  Administered 2015-12-25: 2 via ORAL
  Filled 2015-12-25: qty 2

## 2015-12-25 MED ORDER — ACETAMINOPHEN 325 MG PO TABS: 650.0000 mg | ORAL_TABLET | Freq: Four times a day (QID) | ORAL | Status: DC | PRN

## 2015-12-25 MED ORDER — HYDROCODONE-ACETAMINOPHEN 5-325 MG PO TABS: 2.0000 | ORAL_TABLET | ORAL | Status: DC | PRN

## 2015-12-25 MED ORDER — SENNOSIDES-DOCUSATE SODIUM 8.6-50 MG PO TABS: 2.0000 | ORAL_TABLET | Freq: Two times a day (BID) | ORAL | Status: DC | PRN

## 2015-12-25 NOTE — Evaluation (Signed)
Physical Therapy Evaluation Patient Details Name: Jamie Haas MRN: [id number] DOB: 15-Dec-1945 Today's Date: 12/25/2015   History of Present Illness  70 yo with history of HTN, CVA in 2011, total knee arthroplasty of left one in 2011, who is here after the ER visit on Jan 28th. She had come in the ER on 1/28 after falling down. She was at her grand daughter's house and was walking out of it and missed a step and as a result had twisted her ankle and fell. She did not hit the head and no LOC. Since then, she was having constant pain in left ankle. The xray showed bi-malleolar fracture and put in splint and ER offered her orthopedic consult but the daughter said she wanted to follow up with her orthopedic surgeon .  Her surgeon had retired and she eventually returned to hospital.  Pt is s/p ORIF L ankle fx.  Clinical Impression  Pt admitted with above diagnosis. Pt currently with functional limitations due to the deficits listed below (see PT Problem List).  Pt will benefit from skilled PT to increase their independence and safety with mobility to allow discharge to the venue listed below.  Pt is unable to maintain NWB status and recommend SNF for rehab to work towards returning to [facility] of living alone.     Follow Up Recommendations SNF;Supervision for mobility/OOB    Equipment Recommendations  None recommended by PT    Recommendations for Other Services       Precautions / Restrictions Precautions Precautions: Fall Restrictions LLE Weight Bearing: Non weight bearing      Mobility  Bed Mobility Overal bed mobility: Needs Assistance;+2 for physical assistance Bed Mobility: Supine to Sit     Supine to sit: +2 for physical assistance;Max assist     General bed mobility comments: Cueing for technique. Pt attempting to help, but not really able to.  Use of bed pad to get hips fully turned and scooted to EOB.  Transfers Overall transfer level: Needs assistance   Transfers:  Squat Pivot Transfers     Squat pivot transfers: Total assist;+2 physical assistance     General transfer comment: Attempted to stand at [facility] but unable to due to weakness and pain.  Squat pivot transfer TOT A of 2 with use of bed pads.  Ambulation/Gait                Stairs            Wheelchair Mobility    Modified Rankin (Stroke Patients Only)       Balance Overall balance assessment: Needs assistance;History of Falls Sitting-balance support: Feet supported Sitting balance-Leahy Scale: Good       Standing balance-Leahy Scale: Zero Standing balance comment: unable to maintain L LE NWB                             Pertinent Vitals/Pain Pain Assessment: 0-10 Pain Score: 8  Pain Location: L ankle Pain Descriptors / Indicators: Throbbing Pain Intervention(s): Repositioned;Premedicated before session;Limited activity within patient's tolerance;Monitored during session;Ice applied    Home Living Family/patient expects to be discharged to:: Skilled nursing facility                      Prior Function Level of Independence: Independent with assistive device(s)         Comments: Lived alone and amb with cane.     Hand Dominance  Extremity/Trunk Assessment   Upper Extremity Assessment: Defer to OT evaluation           Lower Extremity Assessment: LLE deficits/detail;RLE deficits/detail RLE Deficits / Details: genu valgus LLE Deficits / Details: short leg cast  Cervical / Trunk Assessment: Normal  Communication   Communication: No difficulties  Cognition Arousal/Alertness: Awake/alert Behavior During Therapy: WFL for tasks assessed/performed Overall Cognitive Status: Within Functional Limits for tasks assessed                      General Comments      Exercises        Assessment/Plan    PT Assessment Patient needs continued PT services  PT Diagnosis Difficulty walking;Generalized weakness;Acute  pain   PT Problem List Decreased strength;Decreased activity tolerance;Decreased balance;Decreased mobility;Decreased knowledge of precautions;Decreased knowledge of use of DME  PT Treatment Interventions Functional mobility training;Therapeutic activities;Therapeutic exercise;DME instruction;Balance training   PT Goals (Current goals can be found in the Care Plan section) Acute Rehab PT Goals Patient Stated Goal: to go to rehab PT Goal Formulation: With patient Time For Goal Achievement: 01/01/16 Potential to Achieve Goals: Good    Frequency Min 3X/week   Barriers to discharge        Co-evaluation PT/OT/SLP Co-Evaluation/Treatment: Yes Reason for Co-Treatment: Complexity of the patient's impairments (multi-system involvement);For patient/therapist safety PT goals addressed during session: Mobility/safety with mobility;Balance;Proper use of DME         End of Session Equipment Utilized During Treatment: Gait belt Activity Tolerance: Patient limited by pain;Patient limited by fatigue Patient left: in chair;with call bell/phone within reach Nurse Communication: Need for lift equipment;Mobility status         Time: 1105-1131 PT Time Calculation (min) (ACUTE ONLY): 26 min   Charges:   PT Evaluation $PT Eval Moderate Complexity: 1 Procedure     PT G Codes:        Jamie Haas 12/25/2015, 12:24 PM

## 2015-12-25 NOTE — Clinical Social Work Note (Signed)
Clinical Social Work Assessment  Patient Details  Name: Jamie Haas MRN: [id number] Date of Birth: 1946/05/10  Date of referral:  12/25/15               Reason for consult:  Facility Placement                Permission sought to share information with:  Family Supports, Chartered certified accountant granted to share information::  Yes, Verbal Permission Granted  Name::      Lawerance Bach, daughter)  Chief Strategy Officer::   (SNFs)  Relationship::     Contact Information:     Housing/Transportation Living arrangements for the past 2 months:  Single Family Home Source of Information:  Patient Patient Interpreter Needed:  None Criminal Activity/Legal Involvement Pertinent to Current Situation/Hospitalization:  No - Comment as needed Significant Relationships:  Adult Children (son and daughter) Lives with:  Self Do you feel safe going back to the place where you live?  No (feels she is unable to care for self) Need for family participation in patient care:     Care giving concerns:  Daughter states patient can longer care for self and feels she needs placement   Social Worker assessment / plan:  CSW met with patient to review disposition plan.  Patient states she feels she needs some STR prior to returning home.  Patient states she lives alone and has limited support from her adult children.    Employment status:  Retired Forensic scientist:  Medicare PT Recommendations:  Not assessed at this time Information / Referral to community resources:     Patient/Family's Response to care:  Patient and daughter, Lawerance Bach, are both agreeable to SNF  Patient/Family's Understanding of and Emotional Response to Diagnosis, Current Treatment, and Prognosis:  Family is realistic regarding level of care needed at time of discharge.  Emotional Assessment Appearance:  Appears stated age Attitude/Demeanor/Rapport:    Affect (typically observed):  Accepting Orientation:  Oriented to Self,  Oriented to Place, Oriented to  Time, Oriented to Situation Alcohol / Substance use:  Not Applicable Psych involvement (Current and /or in the community):  No (Comment)  Discharge Needs  Concerns to be addressed:  No discharge needs identified Readmission within the last 30 days:  No Current discharge risk:  None Barriers to Discharge:  Continued Medical Work up   [employer], LCSW 12/25/2015, 11:24 AM

## 2015-12-25 NOTE — Progress Notes (Signed)
   Subjective:  Patient reports pain as mild.    Objective:   VITALS:   Filed Vitals:   12/24/15 2004 12/24/15 2208 12/25/15 0156 12/25/15 0458  BP: 127/66 146/76 121/61 116/59  Pulse: 78 79 90 93  Temp: 98 F (36.7 C) 98.6 F (37 C) 99 F (37.2 C) 99.1 F (37.3 C)  TempSrc: Oral Oral Oral Oral  Resp: Height:      Weight:      SpO2: 97% 100% 92% 98%    Neurologically intact Neurovascular intact Sensation intact distally Intact pulses distally Dorsiflexion/Plantar flexion intact Incision: dressing C/D/I and no drainage No cellulitis present Compartment soft   Lab Results  Component Value Date   WBC 7.9 12/25/2015   HGB 10.1* 12/25/2015   HCT 31.5* 12/25/2015   MCV 91.6 12/25/2015   PLT 312 12/25/2015     Assessment/Plan:  1 Day Post-Op   - Expected postop acute blood loss anemia - will monitor for symptoms - Up with PT/OT - needs SNF - DVT ppx - SCDs, ambulation, lovenox - NWB operative extremity - patient will need SNF - stable from ortho stand point for dc to SNF when bed available  Cheral Almas 12/25/2015, 7:41 AM [phone]

## 2015-12-25 NOTE — Clinical Social Work Placement (Signed)
   CLINICAL SOCIAL WORK PLACEMENT  NOTE  Date:  12/25/2015  Patient Details  Name: Jamie Haas MRN: [id number] Date of Birth: 26-Aug-1946  Clinical Social Work is seeking post-discharge placement for this patient at the Skilled  Nursing Facility level of care (*CSW will initial, date and re-position this form in  chart as items are completed):  Yes   Patient/family provided with [facility] Clinical Social Work Department's list of facilities offering this level of care within the geographic area requested by the patient (or if unable, by the patient's family).  Yes   Patient/family informed of their freedom to choose among providers that offer the needed level of care, that participate in Medicare, Medicaid or managed care program needed by the patient, have an available bed and are willing to accept the patient.  Yes   Patient/family informed of [facility]'s ownership interest in [facility] and [facility], as well as of the fact that they are under no obligation to receive care at these facilities.  PASRR submitted to EDS on       PASRR number received on       Existing PASRR number confirmed on 12/25/15     FL2 transmitted to all facilities in geographic area requested by pt/family on 12/25/15     FL2 transmitted to all facilities within larger geographic area on       Patient informed that his/her managed care company has contracts with or will negotiate with certain facilities, including the following:        Yes   Patient/family informed of bed offers received.  Patient [facility] bed at [facility]     Physician recommends and patient chooses bed at      Patient to be transferred to [facility] on 12/25/15.  Patient to be transferred to facility by [facility]     Patient family notified on 12/25/15 of transfer.  Name of family member notified:  Tabitha daughter     PHYSICIAN Please prepare priority discharge summary, including medications      Additional Comment:    _______________________________________________ Rondel Baton, LCSW 12/25/2015, 1:30 PM

## 2015-12-25 NOTE — Progress Notes (Signed)
Occupational Therapy Evaluation Patient Details Name: Jamie Haas MRN: [id number] DOB: 01-May-1946 Today's Date: 12/25/2015    History of Present Illness 70 yo with history of HTN, CVA in 2011, total knee arthroplasty of left one in 2011, who is here after the ER visit on Jan 28th. She had come in the ER on 1/28 after falling down. She was at her grand daughter's house and was walking out of it and missed a step and as a result had twisted her ankle and fell. She did not hit the head and no LOC. Since then, she was having constant pain in left ankle. The xray showed bi-malleolar fracture and put in splint and ER offered her orthopedic consult but the daughter said she wanted to follow up with her orthopedic surgeon .  Her surgeon had retired and she eventually returned to hospital.  Pt is s/p ORIF L ankle fx.   Clinical Impression   PTA, pt lived alone and was mod I with mobility and ADL. Pt currently total A +2 with bed - chair squat pivot transfer. Unable to maintain NWB status LLE. Will need SNF for rehab. Will follow acutely to facilitate safe D/C to next venue of care. Nsg aware for need to use lift equipment.     Follow Up Recommendations  SNF;Supervision/Assistance - 24 hour    Equipment Recommendations  Other (comment) (TBA at SNF)    Recommendations for Other Services       Precautions / Restrictions Precautions Precautions: Fall Restrictions Weight Bearing Restrictions: Yes LLE Weight Bearing: Non weight bearing      Mobility Bed Mobility Overal bed mobility: Needs Assistance;+2 for physical assistance Bed Mobility: Sit to Supine     Supine to sit: +2 for physical assistance;Max assist     General bed mobility comments: Cueing for technique. Pt attempting to help, but not really able to.  Use of bed pad to get hips fully turned and scooted to EOB.  Transfers Overall transfer level: Needs assistance   Transfers: Squat Pivot Transfers     Squat pivot  transfers: Total assist;+2 physical assistance     General transfer comment: Attempted to stand at [facility] but unable to due to weakness and pain.  Squat pivot transfer TOT A of 2 with use of bed pads.    Balance Overall balance assessment: Needs assistance;History of Falls Sitting-balance support: Feet supported Sitting balance-Leahy Scale: Good       Standing balance-Leahy Scale: Zero Standing balance comment: unable to achieve upright standing                            ADL Overall ADL's : Needs assistance/impaired     Grooming: Set up   Upper Body Bathing: Set up;Sitting   Lower Body Bathing: Total assistance   Upper Body Dressing : Minimal assistance   Lower Body Dressing: Total assistance;Sit to/from stand   Toilet Transfer: +2 for physical assistance;Total assistance (simulated)           Functional mobility during ADLs: +2 for physical assistance;Total assistance       Vision     Perception     Praxis      Pertinent Vitals/Pain Pain Assessment: 0-10 Pain Score: 8  Pain Location: L ankle Pain Descriptors / Indicators: Aching;Throbbing Pain Intervention(s): Limited activity within patient's tolerance;Repositioned;Ice applied     Hand Dominance     Extremity/Trunk Assessment Upper Extremity Assessment Upper Extremity Assessment: LUE deficits/detail LUE  Deficits / Details: LUE residaul deficits. Uses as funcitonal assist. flexion contracture of hand. Moves in synergy LUE Coordination: decreased fine motor;decreased gross motor   Lower Extremity Assessment Lower Extremity Assessment: Defer to PT evaluation RLE Deficits / Details: genu valgus LLE Deficits / Details: short leg cast LLE: Unable to fully assess due to pain;Unable to fully assess due to immobilization   Cervical / Trunk Assessment Cervical / Trunk Assessment: Normal   Communication Communication Communication: No difficulties   Cognition Arousal/Alertness:  Awake/alert Behavior During Therapy: WFL for tasks assessed/performed Overall Cognitive Status: No family/caregiver present to determine baseline cognitive functioning                     General Comments       Exercises       Shoulder Instructions      Home Living Family/patient expects to be discharged to:: Skilled nursing facility                                        Prior Functioning/Environment Level of Independence: Independent with assistive device(s)        Comments: Lived alone and amb with cane.    OT Diagnosis: Generalized weakness;Acute pain;Other (comment) (residual L hemiparesis)   OT Problem List: Decreased strength;Decreased range of motion;Decreased activity tolerance;Impaired balance (sitting and/or standing);Decreased coordination;Decreased safety awareness;Decreased knowledge of use of DME or AE;Decreased knowledge of precautions;Impaired tone;Obesity;Impaired UE functional use;Pain;Increased edema   OT Treatment/Interventions: Self-care/ADL training;Therapeutic exercise;Energy conservation;DME and/or AE instruction;Therapeutic activities;Patient/family education;Balance training    OT Goals(Current goals can be found in the care plan section) Acute Rehab OT Goals Patient Stated Goal: to go to rehab OT Goal Formulation: With patient Time For Goal Achievement: 01/08/16 Potential to Achieve Goals: Fair  OT Frequency: Min 2X/week   Barriers to D/C: Decreased caregiver support          Co-evaluation   Reason for Co-Treatment: Complexity of the patient's impairments (multi-system involvement);For patient/therapist safety PT goals addressed during session: Mobility/safety with mobility;Balance;Proper use of DME        End of Session Equipment Utilized During Treatment: Gait belt;Rolling walker Nurse Communication: Mobility status;Need for lift equipment [facility])  Activity Tolerance: Patient tolerated treatment  well Patient left: in chair;with call bell/phone within reach   Time: 1105-1132 OT Time Calculation (min): 27 min Charges:  OT General Charges $OT Visit: 1 Procedure OT Evaluation $OT Eval Moderate Complexity: 1 Procedure OT Treatments $Self Care/Home Management : 8-22 mins G-Codes:    Custer Pimenta,HILLARY 2016/01/23, 12:47 PM   [facility], OTR/L  [phone] 2016/01/23

## 2015-12-25 NOTE — Care Management Note (Signed)
Case Management Note  Patient Details  Name: Jamie Haas MRN: [id number] Date of Birth: 03-May-1946  Subjective/Objective:                    Action/Plan:   Expected Discharge Date:  12/25/15               Expected Discharge Plan:  Skilled Nursing Facility  In-House Referral:  Clinical Social Work  Discharge planning Services     Post Acute Care Choice:    Choice offered to:     DME Arranged:    DME Agency:     HH Arranged:    HH Agency:     Status of Service:  In process, will continue to follow  Medicare Important Message Given:    Date Medicare IM Given:    Medicare IM give by:    Date Additional Medicare IM Given:    Additional Medicare Important Message give by:     If discussed at Long Length of Stay Meetings, dates discussed:    Additional Comments:  Kingsley Plan, RN 12/25/2015, 8:11 AM

## 2015-12-25 NOTE — Progress Notes (Signed)
Subjective:  NAEON. Patient tolerated surgery well without complication.  However, she had poor sleep overnight 2/2 pain.  Her last BM was Sunday.    Objective: Vital signs in last 24 hours: Filed Vitals:   12/24/15 2004 12/24/15 2208 12/25/15 0156 12/25/15 0458  BP: 127/66 146/76 121/61 116/59  Pulse: 78 79 90 93  Temp: 98 F (36.7 C) 98.6 F (37 C) 99 F (37.2 C) 99.1 F (37.3 C)  TempSrc: Oral Oral Oral Oral  Resp: Height:      Weight:      SpO2: 97% 100% 92% 98%   Weight change:   Intake/Output Summary (Last 24 hours) at 12/25/15 0845 Last data filed at 12/25/15 4098  Gross per 24 hour  Intake 3961.66 ml  Output   2725 ml  Net 1236.66 ml   Physical Exam  Constitutional: She is oriented to person, place, and time and well-developed, well-nourished, and in no distress. No distress.  HENT:  Head: Normocephalic and atraumatic.  Eyes: EOM are normal.  Neck: No tracheal deviation present.  Cardiovascular: Normal rate, regular rhythm and normal heart sounds.   Pulmonary/Chest: Effort normal and breath sounds normal. No stridor. No respiratory distress. She has no wheezes.  Abdominal: Soft. She exhibits no distension. There is no tenderness. There is no rebound and no guarding.  Palpable stool  Musculoskeletal:  LLE bandaged and immobilized.  Patient able to feel light touch and move toes.  Neurological: She is alert and oriented to person, place, and time.  Skin: Skin is warm and dry. She is not diaphoretic.    Lab Results: Results for orders placed or performed during the hospital encounter of 12/21/15 (from the past 24 hour(s))  CBC     Status: Abnormal   Collection Time: 12/25/15  5:47 AM  Result Value Ref Range   WBC 7.9 4.0 - 10.5 K/uL   RBC 3.44 (L) 3.87 - 5.11 MIL/uL   Hemoglobin 10.1 (L) 12.0 - 15.0 g/dL   HCT 11.9 (L) 14.7 - 82.9 %   MCV 91.6 78.0 - 100.0 fL   MCH 29.4 26.0 - 34.0 pg   MCHC 32.1 30.0 - 36.0 g/dL   RDW 56.2 13.0 - 86.5  %   Platelets 312 150 - 400 K/uL     Micro Results: Recent Results (from the past 240 hour(s))  Surgical pcr screen     Status: Abnormal   Collection Time: 12/21/15  9:52 PM  Result Value Ref Range Status   MRSA, PCR NEGATIVE NEGATIVE Final   Staphylococcus aureus POSITIVE (A) NEGATIVE Final    Comment:        The Xpert SA Assay (FDA approved for NASAL specimens in patients over 92 years of age), is one component of a comprehensive surveillance program.  Test performance has been validated by [employer] for patients greater than or equal to 90 year old. It is not intended to diagnose infection nor to guide or monitor treatment.    Studies/Results: Dg C-arm 1-60 Min  12/24/2015  CLINICAL DATA:  ORIF RIGHT ankle, trimalleolar fracture EXAM: LEFT ANKLE - COMPLETE 3+ VIEW; DG C-ARM 61-120 MIN COMPARISON:  12/21/2015 FINDINGS: Lateral plate and multiple screws identified at fibula post ORIF of a reduced distal fibular fracture. Single syndesmotic screw present. Two cannulated screws are present at the medial malleolus post ORIF of a reduced medial malleolar fracture. Posterior malleolar fracture fragment again identified. Ankle mortise intact. No other focal bony abnormality seen.  IMPRESSION: Post ORIF of reduced medial malleolar and distal fibular fractures. Electronically Signed   By: Ulyses Southward M.D.   On: 12/24/2015 16:10   Medications: I have reviewed the patient's current medications. Scheduled Meds: .  ceFAZolin (ANCEF) IV  2 g Intravenous Q6H  . Chlorhexidine Gluconate Cloth  6 each Topical Daily  . diclofenac sodium  2 g Topical QID  . enoxaparin (LOVENOX) injection  40 mg Subcutaneous Q24H  . hydrocerin   Topical BID  . losartan  25 mg Oral Daily  . mirabegron ER  25 mg Oral Daily  . montelukast  10 mg Oral Daily  . mupirocin ointment  1 application Nasal BID  . nicotine  14 mg Transdermal Daily  . polyethylene glycol  17 g Oral Daily   Continuous Infusions: .  sodium chloride 125 mL/hr at 12/25/15 0400   PRN Meds:.acetaminophen **OR** acetaminophen, diphenhydrAMINE, HYDROcodone-acetaminophen, magnesium citrate, methocarbamol **OR** methocarbamol (ROBAXIN)  IV, metoCLOPramide **OR** metoCLOPramide (REGLAN) injection, ondansetron **OR** ondansetron (ZOFRAN) IV, oxyCODONE, sorbitol Assessment/Plan: Active Problems:   Ankle fracture   Trimalleolar fracture of ankle, closed   Ankle fracture, bimalleolar, closed   Essential hypertension   Arthritis, senescent   Pressure ulcer  Ms. 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  Left Bimalleolar Ankle Fractrue: s/p ORIF trimalleolar fracture without fixation of posterior malleolus.  She is NWB LLE for 6 weeks.  She will need SNF placement for continued PT and rehab.   - Ortho following appreciate recs - HH diet after surgery - Norco 5-325 1-2 tabs q4 hours PRN - Morphine 2 mg IV q4h PRN - Zofran PRN - PT/OT - CSW for SNF  HTN: Home regimen is Diovan (valsartan-hctz). MAP in 70s, BP stable  - Losartan 25 mg daily   Stress incontinence - Myrbetriq   DVT Ppx: Lovenox daily x2 weeks  Dispo: Disposition is deferred at this time, awaiting improvement of current medical problems.  Anticipated discharge in approximately 2 day(s).   The patient does have a current PCP (Renaye Rakers, MD) and does need an [facility] hospital follow-up appointment after discharge.  The patient does not have transportation limitations that hinder transportation to clinic appointments.  .Services Needed at time of discharge: Y = Yes, Blank = No PT:   OT:   RN:   Equipment:   Other:     LOS: 4 days   [name], MD 12/25/2015, 8:45 AM

## 2015-12-25 NOTE — Clinical Social Work Note (Signed)
Patient will discharge today per MD order. Patient will discharge to: [facility] SNF RN to call report prior to transportation to: [phone] Transportation: [facility]  CSW sent discharge summary to SNF for review.  [facility] forms on chart.  CSW spoke with patient, daughter, RN and MD who are all aware and agreeable to these discharge plans.  Vickii Penna, LCSW [phone]  [id number], 2S 15-16 and Psychiatric Service Line  Licensed Clinical Social Worker

## 2015-12-25 NOTE — Clinical Social Work Note (Addendum)
CSW received notification that patient would likely need SNF at time of discharge.  Patient has Medicaid as payer source.  CSW contacted financial counseling who reports patient had [facility] Medicare, but terminated on 11/11/2015.  Patient states her Medicare card is at home. CSW contacted patient's daughter, Wyatt Mage who is agreeable to assist with locating the patient's Medicare card to update our records.  CSW will need payer source information prior to finding placement for patient.  CSW will continue to follow and assist with disposition.  Of note, PT recommendation pending.  Barriers to placement: PT evaluation, Medicare card cannot be obtained until after 3:30pm today  Vickii Penna, LCSW [phone]  5N1-9; 2S 15-16 and Hospital Psychiatric Service Line Licensed Clinical Social Worker

## 2015-12-25 NOTE — Progress Notes (Signed)
Internal Medicine Attending  Date: 12/25/2015  Patient name: Jamie Haas Medical record number: [id number] Date of birth: December 07, 1945 Age: 70 y.o. Gender: female  I saw and evaluated the patient. I reviewed the resident's note by Dr. Ladona Ridgel and I agree with the resident's findings and plans as documented in his progress note.  When seen on rounds this morning Ms. Faucett was doing well with improved pain control. She is stable for transfer to a skilled nursing facility for further rehabilitation. She is to remain on weightbearing on her left ankle for 6 weeks.

## 2015-12-25 NOTE — [place] FL2 (Signed)
[place] MEDICAID FL2 LEVEL OF CARE SCREENING TOOL     IDENTIFICATION  Patient Name: Jamie Haas Birthdate: 10-18-1946 Sex: female Admission Date (Current Location): 12/21/2015  [facility] and [place] Number:  Producer, television/film/video and Address:  [facility], [address], [place], [place] [address]      Provider Number: [id number]  Attending Physician Name and Address:  Doneen Poisson, MD  Relative Name and Phone Number:       Current Level of Care: Hospital Recommended Level of Care: Skilled Nursing Facility Prior Approval Number:    Date Approved/Denied:   PASRR Number: [id number]  Discharge Plan: SNF    Current Diagnoses: Patient Active Problem List   Diagnosis Date Noted  . Ankle fracture 12/21/2015  . Trimalleolar fracture of ankle, closed 12/21/2015  . Pressure ulcer 12/21/2015  . Ankle fracture, bimalleolar, closed   . Essential hypertension   . Arthritis, senescent     Orientation RESPIRATION BLADDER Height & Weight     Self, Time, Situation, Place  Normal Continent Weight: 189 lb 2.5 oz (85.8 kg) Height:   (165.1 cm)  BEHAVIORAL SYMPTOMS/MOOD NEUROLOGICAL BOWEL NUTRITION STATUS      Continent  (please see dc summary for dietary needs)  AMBULATORY STATUS COMMUNICATION OF NEEDS Skin   Limited Assist Verbally Surgical wounds, PU Stage and Appropriate Care   PU Stage 2 Dressing: BID                   Personal Care Assistance Level of Assistance  Bathing, Dressing Bathing Assistance: Limited assistance   Dressing Assistance: Limited assistance     Functional Limitations Info             SPECIAL CARE FACTORS FREQUENCY  PT (By licensed PT), OT (By licensed OT)                    Contractures Contractures Info: Not present    Additional Factors Info                  Current Medications (12/25/2015):  This is the current hospital active medication list Current Facility-Administered  Medications  Medication Dose Route Frequency Provider Last Rate Last Dose  . 0.9 %  sodium chloride infusion   Intravenous Continuous Tarry Kos, MD 125 mL/hr at 12/25/15 0400    . acetaminophen (TYLENOL) tablet 650 mg  650 mg Oral Q6H PRN Tarry Kos, MD       Or  . acetaminophen (TYLENOL) suppository 650 mg  650 mg Rectal Q6H PRN Naiping Donnelly Stager, MD      . Chlorhexidine Gluconate Cloth 2 % PADS 6 each  6 each Topical Daily Doneen Poisson, MD   6 each at 12/25/15 [phone]  . diclofenac sodium (VOLTAREN) 1 % transdermal gel 2 g  2 g Topical QID Deneise Lever, MD   2 g at 12/25/15 0817  . diphenhydrAMINE (BENADRYL) 12.5 MG/5ML elixir 25 mg  25 mg Oral Q4H PRN Naiping Donnelly Stager, MD      . enoxaparin (LOVENOX) injection 40 mg  40 mg Subcutaneous Q24H Naiping Donnelly Stager, MD   40 mg at 12/25/15 0815  . hydrocerin (EUCERIN) cream   Topical BID Deneise Lever, MD      . HYDROcodone-acetaminophen (NORCO/VICODIN) 5-325 MG per tablet 2 tablet  2 tablet Oral Q4H PRN Fuller Plan, MD   2 tablet at 12/25/15 0816  . losartan (COZAAR) tablet 25 mg  25 mg Oral Daily Deneise Lever, MD   25 mg at 12/25/15 0816  . magnesium citrate solution 1 Bottle  1 Bottle Oral Once PRN Naiping Donnelly Stager, MD      . methocarbamol (ROBAXIN) tablet 500 mg  500 mg Oral Q6H PRN Tarry Kos, MD   500 mg at 12/25/15 4098   Or  . methocarbamol (ROBAXIN) 500 mg in dextrose 5 % 50 mL IVPB  500 mg Intravenous Q6H PRN Naiping Donnelly Stager, MD      . metoCLOPramide (REGLAN) tablet 5-10 mg  5-10 mg Oral Q8H PRN Naiping Donnelly Stager, MD       Or  . metoCLOPramide (REGLAN) injection 5-10 mg  5-10 mg Intravenous Q8H PRN Naiping Donnelly Stager, MD      . mirabegron ER [facility]) tablet 25 mg  25 mg Oral Daily Deneise Lever, MD   25 mg at 12/25/15 0948  . montelukast (SINGULAIR) tablet 10 mg  10 mg Oral Daily Deneise Lever, MD   10 mg at 12/25/15 0816  . morphine 2 MG/ML injection 2 mg  2 mg Intravenous Q4H PRN [name], MD      . mupirocin ointment (BACTROBAN) 2 % 1  application  1 application Nasal BID Doneen Poisson, MD   1 application at 12/25/15 0818  . nicotine (NICODERM CQ - dosed in mg/24 hours) patch 14 mg  14 mg Transdermal Daily Deneise Lever, MD   14 mg at 12/25/15 0823  . ondansetron (ZOFRAN) tablet 4 mg  4 mg Oral Q6H PRN Deneise Lever, MD       Or  . ondansetron (ZOFRAN) injection 4 mg  4 mg Intravenous Q6H PRN Deneise Lever, MD      . polyethylene glycol (MIRALAX / GLYCOLAX) packet 17 g  17 g Oral BID Darreld Mclean, MD      . senna-docusate (Senokot-S) tablet 2 tablet  2 tablet Oral BID [name], MD   2 tablet at 12/25/15 [phone]  . sorbitol 70 % solution 30 mL  30 mL Oral Daily PRN Naiping Donnelly Stager, MD         Discharge Medications: Please see discharge summary for a list of discharge medications.  Relevant Imaging Results:  Relevant Lab Results:   Additional Information SSN: [id number]  Rondel Baton, LCSW

## 2016-01-01 DIAGNOSIS — I10 Essential (primary) hypertension: Secondary | ICD-10-CM | POA: Diagnosis not present

## 2016-01-01 DIAGNOSIS — I639 Cerebral infarction, unspecified: Secondary | ICD-10-CM | POA: Diagnosis not present

## 2016-01-01 DIAGNOSIS — S82843S Displaced bimalleolar fracture of unspecified lower leg, sequela: Secondary | ICD-10-CM | POA: Diagnosis not present

## 2016-01-01 DIAGNOSIS — M1990 Unspecified osteoarthritis, unspecified site: Secondary | ICD-10-CM | POA: Diagnosis not present

## 2016-01-12 DIAGNOSIS — S82843S Displaced bimalleolar fracture of unspecified lower leg, sequela: Secondary | ICD-10-CM | POA: Diagnosis not present

## 2016-01-12 DIAGNOSIS — R1084 Generalized abdominal pain: Secondary | ICD-10-CM | POA: Diagnosis not present

## 2016-02-02 DIAGNOSIS — S82843S Displaced bimalleolar fracture of unspecified lower leg, sequela: Secondary | ICD-10-CM | POA: Diagnosis not present

## 2016-02-02 DIAGNOSIS — M1990 Unspecified osteoarthritis, unspecified site: Secondary | ICD-10-CM | POA: Diagnosis not present

## 2016-02-02 DIAGNOSIS — I10 Essential (primary) hypertension: Secondary | ICD-10-CM | POA: Diagnosis not present

## 2016-02-02 DIAGNOSIS — N611 Abscess of the breast and nipple: Secondary | ICD-10-CM | POA: Diagnosis not present

## 2016-02-02 DIAGNOSIS — G4700 Insomnia, unspecified: Secondary | ICD-10-CM | POA: Diagnosis not present

## 2016-02-04 DIAGNOSIS — S82852D Displaced trimalleolar fracture of left lower leg, subsequent encounter for closed fracture with routine healing: Secondary | ICD-10-CM | POA: Diagnosis not present

## 2016-02-04 DIAGNOSIS — H542 Low vision, both eyes: Secondary | ICD-10-CM | POA: Diagnosis not present

## 2016-02-04 DIAGNOSIS — I10 Essential (primary) hypertension: Secondary | ICD-10-CM | POA: Diagnosis not present

## 2016-02-04 DIAGNOSIS — I69354 Hemiplegia and hemiparesis following cerebral infarction affecting left non-dominant side: Secondary | ICD-10-CM | POA: Diagnosis not present

## 2016-02-04 DIAGNOSIS — Z9181 History of falling: Secondary | ICD-10-CM | POA: Diagnosis not present

## 2016-02-04 DIAGNOSIS — Z96652 Presence of left artificial knee joint: Secondary | ICD-10-CM | POA: Diagnosis not present

## 2016-02-04 DIAGNOSIS — N611 Abscess of the breast and nipple: Secondary | ICD-10-CM | POA: Diagnosis not present

## 2016-02-04 DIAGNOSIS — M1990 Unspecified osteoarthritis, unspecified site: Secondary | ICD-10-CM | POA: Diagnosis not present

## 2016-02-05 DIAGNOSIS — H542 Low vision, both eyes: Secondary | ICD-10-CM | POA: Diagnosis not present

## 2016-02-05 DIAGNOSIS — I10 Essential (primary) hypertension: Secondary | ICD-10-CM | POA: Diagnosis not present

## 2016-02-05 DIAGNOSIS — Z96652 Presence of left artificial knee joint: Secondary | ICD-10-CM | POA: Diagnosis not present

## 2016-02-05 DIAGNOSIS — Z9181 History of falling: Secondary | ICD-10-CM | POA: Diagnosis not present

## 2016-02-05 DIAGNOSIS — I69354 Hemiplegia and hemiparesis following cerebral infarction affecting left non-dominant side: Secondary | ICD-10-CM | POA: Diagnosis not present

## 2016-02-05 DIAGNOSIS — S82852D Displaced trimalleolar fracture of left lower leg, subsequent encounter for closed fracture with routine healing: Secondary | ICD-10-CM | POA: Diagnosis not present

## 2016-02-05 DIAGNOSIS — M1990 Unspecified osteoarthritis, unspecified site: Secondary | ICD-10-CM | POA: Diagnosis not present

## 2016-02-05 DIAGNOSIS — N611 Abscess of the breast and nipple: Secondary | ICD-10-CM | POA: Diagnosis not present

## 2016-02-06 DIAGNOSIS — M1990 Unspecified osteoarthritis, unspecified site: Secondary | ICD-10-CM | POA: Diagnosis not present

## 2016-02-06 DIAGNOSIS — N611 Abscess of the breast and nipple: Secondary | ICD-10-CM | POA: Diagnosis not present

## 2016-02-06 DIAGNOSIS — I69354 Hemiplegia and hemiparesis following cerebral infarction affecting left non-dominant side: Secondary | ICD-10-CM | POA: Diagnosis not present

## 2016-02-06 DIAGNOSIS — Z96652 Presence of left artificial knee joint: Secondary | ICD-10-CM | POA: Diagnosis not present

## 2016-02-06 DIAGNOSIS — Z9181 History of falling: Secondary | ICD-10-CM | POA: Diagnosis not present

## 2016-02-06 DIAGNOSIS — H542 Low vision, both eyes: Secondary | ICD-10-CM | POA: Diagnosis not present

## 2016-02-06 DIAGNOSIS — I10 Essential (primary) hypertension: Secondary | ICD-10-CM | POA: Diagnosis not present

## 2016-02-06 DIAGNOSIS — S82852D Displaced trimalleolar fracture of left lower leg, subsequent encounter for closed fracture with routine healing: Secondary | ICD-10-CM | POA: Diagnosis not present

## 2016-02-10 DIAGNOSIS — M1990 Unspecified osteoarthritis, unspecified site: Secondary | ICD-10-CM | POA: Diagnosis not present

## 2016-02-10 DIAGNOSIS — I69354 Hemiplegia and hemiparesis following cerebral infarction affecting left non-dominant side: Secondary | ICD-10-CM | POA: Diagnosis not present

## 2016-02-10 DIAGNOSIS — I10 Essential (primary) hypertension: Secondary | ICD-10-CM | POA: Diagnosis not present

## 2016-02-10 DIAGNOSIS — S82852D Displaced trimalleolar fracture of left lower leg, subsequent encounter for closed fracture with routine healing: Secondary | ICD-10-CM | POA: Diagnosis not present

## 2016-02-10 DIAGNOSIS — Z9181 History of falling: Secondary | ICD-10-CM | POA: Diagnosis not present

## 2016-02-10 DIAGNOSIS — H542 Low vision, both eyes: Secondary | ICD-10-CM | POA: Diagnosis not present

## 2016-02-10 DIAGNOSIS — Z96652 Presence of left artificial knee joint: Secondary | ICD-10-CM | POA: Diagnosis not present

## 2016-02-10 DIAGNOSIS — N611 Abscess of the breast and nipple: Secondary | ICD-10-CM | POA: Diagnosis not present

## 2016-02-11 DIAGNOSIS — S82852D Displaced trimalleolar fracture of left lower leg, subsequent encounter for closed fracture with routine healing: Secondary | ICD-10-CM | POA: Diagnosis not present

## 2016-02-11 DIAGNOSIS — Z96652 Presence of left artificial knee joint: Secondary | ICD-10-CM | POA: Diagnosis not present

## 2016-02-11 DIAGNOSIS — Z9181 History of falling: Secondary | ICD-10-CM | POA: Diagnosis not present

## 2016-02-11 DIAGNOSIS — N611 Abscess of the breast and nipple: Secondary | ICD-10-CM | POA: Diagnosis not present

## 2016-02-11 DIAGNOSIS — I10 Essential (primary) hypertension: Secondary | ICD-10-CM | POA: Diagnosis not present

## 2016-02-11 DIAGNOSIS — M1990 Unspecified osteoarthritis, unspecified site: Secondary | ICD-10-CM | POA: Diagnosis not present

## 2016-02-11 DIAGNOSIS — I69354 Hemiplegia and hemiparesis following cerebral infarction affecting left non-dominant side: Secondary | ICD-10-CM | POA: Diagnosis not present

## 2016-02-11 DIAGNOSIS — H542 Low vision, both eyes: Secondary | ICD-10-CM | POA: Diagnosis not present

## 2016-02-12 DIAGNOSIS — H542 Low vision, both eyes: Secondary | ICD-10-CM | POA: Diagnosis not present

## 2016-02-12 DIAGNOSIS — I10 Essential (primary) hypertension: Secondary | ICD-10-CM | POA: Diagnosis not present

## 2016-02-12 DIAGNOSIS — M1990 Unspecified osteoarthritis, unspecified site: Secondary | ICD-10-CM | POA: Diagnosis not present

## 2016-02-12 DIAGNOSIS — Z9181 History of falling: Secondary | ICD-10-CM | POA: Diagnosis not present

## 2016-02-12 DIAGNOSIS — I69354 Hemiplegia and hemiparesis following cerebral infarction affecting left non-dominant side: Secondary | ICD-10-CM | POA: Diagnosis not present

## 2016-02-12 DIAGNOSIS — N611 Abscess of the breast and nipple: Secondary | ICD-10-CM | POA: Diagnosis not present

## 2016-02-12 DIAGNOSIS — Z96652 Presence of left artificial knee joint: Secondary | ICD-10-CM | POA: Diagnosis not present

## 2016-02-12 DIAGNOSIS — S82852D Displaced trimalleolar fracture of left lower leg, subsequent encounter for closed fracture with routine healing: Secondary | ICD-10-CM | POA: Diagnosis not present

## 2016-02-13 DIAGNOSIS — I69354 Hemiplegia and hemiparesis following cerebral infarction affecting left non-dominant side: Secondary | ICD-10-CM | POA: Diagnosis not present

## 2016-02-13 DIAGNOSIS — N611 Abscess of the breast and nipple: Secondary | ICD-10-CM | POA: Diagnosis not present

## 2016-02-13 DIAGNOSIS — S82852D Displaced trimalleolar fracture of left lower leg, subsequent encounter for closed fracture with routine healing: Secondary | ICD-10-CM | POA: Diagnosis not present

## 2016-02-13 DIAGNOSIS — H542 Low vision, both eyes: Secondary | ICD-10-CM | POA: Diagnosis not present

## 2016-02-13 DIAGNOSIS — I10 Essential (primary) hypertension: Secondary | ICD-10-CM | POA: Diagnosis not present

## 2016-02-13 DIAGNOSIS — Z9181 History of falling: Secondary | ICD-10-CM | POA: Diagnosis not present

## 2016-02-13 DIAGNOSIS — M1990 Unspecified osteoarthritis, unspecified site: Secondary | ICD-10-CM | POA: Diagnosis not present

## 2016-02-13 DIAGNOSIS — Z96652 Presence of left artificial knee joint: Secondary | ICD-10-CM | POA: Diagnosis not present

## 2016-02-16 DIAGNOSIS — M1990 Unspecified osteoarthritis, unspecified site: Secondary | ICD-10-CM | POA: Diagnosis not present

## 2016-02-16 DIAGNOSIS — Z9181 History of falling: Secondary | ICD-10-CM | POA: Diagnosis not present

## 2016-02-16 DIAGNOSIS — N611 Abscess of the breast and nipple: Secondary | ICD-10-CM | POA: Diagnosis not present

## 2016-02-16 DIAGNOSIS — H542 Low vision, both eyes: Secondary | ICD-10-CM | POA: Diagnosis not present

## 2016-02-16 DIAGNOSIS — I69354 Hemiplegia and hemiparesis following cerebral infarction affecting left non-dominant side: Secondary | ICD-10-CM | POA: Diagnosis not present

## 2016-02-16 DIAGNOSIS — Z96652 Presence of left artificial knee joint: Secondary | ICD-10-CM | POA: Diagnosis not present

## 2016-02-16 DIAGNOSIS — S82852D Displaced trimalleolar fracture of left lower leg, subsequent encounter for closed fracture with routine healing: Secondary | ICD-10-CM | POA: Diagnosis not present

## 2016-02-16 DIAGNOSIS — I10 Essential (primary) hypertension: Secondary | ICD-10-CM | POA: Diagnosis not present

## 2016-02-17 DIAGNOSIS — H542 Low vision, both eyes: Secondary | ICD-10-CM | POA: Diagnosis not present

## 2016-02-17 DIAGNOSIS — S82852D Displaced trimalleolar fracture of left lower leg, subsequent encounter for closed fracture with routine healing: Secondary | ICD-10-CM | POA: Diagnosis not present

## 2016-02-17 DIAGNOSIS — N611 Abscess of the breast and nipple: Secondary | ICD-10-CM | POA: Diagnosis not present

## 2016-02-17 DIAGNOSIS — M1990 Unspecified osteoarthritis, unspecified site: Secondary | ICD-10-CM | POA: Diagnosis not present

## 2016-02-17 DIAGNOSIS — I69354 Hemiplegia and hemiparesis following cerebral infarction affecting left non-dominant side: Secondary | ICD-10-CM | POA: Diagnosis not present

## 2016-02-17 DIAGNOSIS — Z9181 History of falling: Secondary | ICD-10-CM | POA: Diagnosis not present

## 2016-02-17 DIAGNOSIS — I10 Essential (primary) hypertension: Secondary | ICD-10-CM | POA: Diagnosis not present

## 2016-02-17 DIAGNOSIS — Z96652 Presence of left artificial knee joint: Secondary | ICD-10-CM | POA: Diagnosis not present

## 2016-02-18 DIAGNOSIS — M1990 Unspecified osteoarthritis, unspecified site: Secondary | ICD-10-CM | POA: Diagnosis not present

## 2016-02-18 DIAGNOSIS — Z96652 Presence of left artificial knee joint: Secondary | ICD-10-CM | POA: Diagnosis not present

## 2016-02-18 DIAGNOSIS — I69354 Hemiplegia and hemiparesis following cerebral infarction affecting left non-dominant side: Secondary | ICD-10-CM | POA: Diagnosis not present

## 2016-02-18 DIAGNOSIS — I10 Essential (primary) hypertension: Secondary | ICD-10-CM | POA: Diagnosis not present

## 2016-02-18 DIAGNOSIS — N611 Abscess of the breast and nipple: Secondary | ICD-10-CM | POA: Diagnosis not present

## 2016-02-18 DIAGNOSIS — Z9181 History of falling: Secondary | ICD-10-CM | POA: Diagnosis not present

## 2016-02-18 DIAGNOSIS — H542 Low vision, both eyes: Secondary | ICD-10-CM | POA: Diagnosis not present

## 2016-02-18 DIAGNOSIS — S82852D Displaced trimalleolar fracture of left lower leg, subsequent encounter for closed fracture with routine healing: Secondary | ICD-10-CM | POA: Diagnosis not present

## 2016-02-19 DIAGNOSIS — N611 Abscess of the breast and nipple: Secondary | ICD-10-CM | POA: Diagnosis not present

## 2016-02-19 DIAGNOSIS — M1990 Unspecified osteoarthritis, unspecified site: Secondary | ICD-10-CM | POA: Diagnosis not present

## 2016-02-19 DIAGNOSIS — S82852D Displaced trimalleolar fracture of left lower leg, subsequent encounter for closed fracture with routine healing: Secondary | ICD-10-CM | POA: Diagnosis not present

## 2016-02-19 DIAGNOSIS — H542 Low vision, both eyes: Secondary | ICD-10-CM | POA: Diagnosis not present

## 2016-02-19 DIAGNOSIS — I10 Essential (primary) hypertension: Secondary | ICD-10-CM | POA: Diagnosis not present

## 2016-02-19 DIAGNOSIS — Z96652 Presence of left artificial knee joint: Secondary | ICD-10-CM | POA: Diagnosis not present

## 2016-02-19 DIAGNOSIS — I69354 Hemiplegia and hemiparesis following cerebral infarction affecting left non-dominant side: Secondary | ICD-10-CM | POA: Diagnosis not present

## 2016-02-19 DIAGNOSIS — Z9181 History of falling: Secondary | ICD-10-CM | POA: Diagnosis not present

## 2016-02-20 DIAGNOSIS — S82852D Displaced trimalleolar fracture of left lower leg, subsequent encounter for closed fracture with routine healing: Secondary | ICD-10-CM | POA: Diagnosis not present

## 2016-02-20 DIAGNOSIS — N611 Abscess of the breast and nipple: Secondary | ICD-10-CM | POA: Diagnosis not present

## 2016-02-20 DIAGNOSIS — I69354 Hemiplegia and hemiparesis following cerebral infarction affecting left non-dominant side: Secondary | ICD-10-CM | POA: Diagnosis not present

## 2016-02-20 DIAGNOSIS — I10 Essential (primary) hypertension: Secondary | ICD-10-CM | POA: Diagnosis not present

## 2016-02-20 DIAGNOSIS — Z96652 Presence of left artificial knee joint: Secondary | ICD-10-CM | POA: Diagnosis not present

## 2016-02-20 DIAGNOSIS — Z9181 History of falling: Secondary | ICD-10-CM | POA: Diagnosis not present

## 2016-02-20 DIAGNOSIS — M1990 Unspecified osteoarthritis, unspecified site: Secondary | ICD-10-CM | POA: Diagnosis not present

## 2016-02-20 DIAGNOSIS — H542 Low vision, both eyes: Secondary | ICD-10-CM | POA: Diagnosis not present

## 2016-02-23 DIAGNOSIS — S82842D Displaced bimalleolar fracture of left lower leg, subsequent encounter for closed fracture with routine healing: Secondary | ICD-10-CM | POA: Diagnosis not present

## 2016-02-23 DIAGNOSIS — S93432D Sprain of tibiofibular ligament of left ankle, subsequent encounter: Secondary | ICD-10-CM | POA: Diagnosis not present

## 2016-02-23 DIAGNOSIS — S82852D Displaced trimalleolar fracture of left lower leg, subsequent encounter for closed fracture with routine healing: Secondary | ICD-10-CM | POA: Diagnosis not present

## 2016-02-24 DIAGNOSIS — M1990 Unspecified osteoarthritis, unspecified site: Secondary | ICD-10-CM | POA: Diagnosis not present

## 2016-02-24 DIAGNOSIS — H542 Low vision, both eyes: Secondary | ICD-10-CM | POA: Diagnosis not present

## 2016-02-24 DIAGNOSIS — I10 Essential (primary) hypertension: Secondary | ICD-10-CM | POA: Diagnosis not present

## 2016-02-24 DIAGNOSIS — Z9181 History of falling: Secondary | ICD-10-CM | POA: Diagnosis not present

## 2016-02-24 DIAGNOSIS — I69354 Hemiplegia and hemiparesis following cerebral infarction affecting left non-dominant side: Secondary | ICD-10-CM | POA: Diagnosis not present

## 2016-02-24 DIAGNOSIS — S82852D Displaced trimalleolar fracture of left lower leg, subsequent encounter for closed fracture with routine healing: Secondary | ICD-10-CM | POA: Diagnosis not present

## 2016-02-24 DIAGNOSIS — Z96652 Presence of left artificial knee joint: Secondary | ICD-10-CM | POA: Diagnosis not present

## 2016-02-24 DIAGNOSIS — N611 Abscess of the breast and nipple: Secondary | ICD-10-CM | POA: Diagnosis not present

## 2016-02-25 DIAGNOSIS — S82852D Displaced trimalleolar fracture of left lower leg, subsequent encounter for closed fracture with routine healing: Secondary | ICD-10-CM | POA: Diagnosis not present

## 2016-02-25 DIAGNOSIS — Z96652 Presence of left artificial knee joint: Secondary | ICD-10-CM | POA: Diagnosis not present

## 2016-02-25 DIAGNOSIS — N611 Abscess of the breast and nipple: Secondary | ICD-10-CM | POA: Diagnosis not present

## 2016-02-25 DIAGNOSIS — Z9181 History of falling: Secondary | ICD-10-CM | POA: Diagnosis not present

## 2016-02-25 DIAGNOSIS — I69354 Hemiplegia and hemiparesis following cerebral infarction affecting left non-dominant side: Secondary | ICD-10-CM | POA: Diagnosis not present

## 2016-02-25 DIAGNOSIS — I10 Essential (primary) hypertension: Secondary | ICD-10-CM | POA: Diagnosis not present

## 2016-02-25 DIAGNOSIS — H542 Low vision, both eyes: Secondary | ICD-10-CM | POA: Diagnosis not present

## 2016-02-25 DIAGNOSIS — M1990 Unspecified osteoarthritis, unspecified site: Secondary | ICD-10-CM | POA: Diagnosis not present

## 2016-02-26 DIAGNOSIS — N611 Abscess of the breast and nipple: Secondary | ICD-10-CM | POA: Diagnosis not present

## 2016-02-26 DIAGNOSIS — I10 Essential (primary) hypertension: Secondary | ICD-10-CM | POA: Diagnosis not present

## 2016-02-26 DIAGNOSIS — S82852D Displaced trimalleolar fracture of left lower leg, subsequent encounter for closed fracture with routine healing: Secondary | ICD-10-CM | POA: Diagnosis not present

## 2016-02-26 DIAGNOSIS — H542 Low vision, both eyes: Secondary | ICD-10-CM | POA: Diagnosis not present

## 2016-02-26 DIAGNOSIS — Z9181 History of falling: Secondary | ICD-10-CM | POA: Diagnosis not present

## 2016-02-26 DIAGNOSIS — Z96652 Presence of left artificial knee joint: Secondary | ICD-10-CM | POA: Diagnosis not present

## 2016-02-26 DIAGNOSIS — M1990 Unspecified osteoarthritis, unspecified site: Secondary | ICD-10-CM | POA: Diagnosis not present

## 2016-02-26 DIAGNOSIS — I69354 Hemiplegia and hemiparesis following cerebral infarction affecting left non-dominant side: Secondary | ICD-10-CM | POA: Diagnosis not present

## 2016-02-27 DIAGNOSIS — H542 Low vision, both eyes: Secondary | ICD-10-CM | POA: Diagnosis not present

## 2016-02-27 DIAGNOSIS — I10 Essential (primary) hypertension: Secondary | ICD-10-CM | POA: Diagnosis not present

## 2016-02-27 DIAGNOSIS — N611 Abscess of the breast and nipple: Secondary | ICD-10-CM | POA: Diagnosis not present

## 2016-02-27 DIAGNOSIS — M1990 Unspecified osteoarthritis, unspecified site: Secondary | ICD-10-CM | POA: Diagnosis not present

## 2016-02-27 DIAGNOSIS — I69354 Hemiplegia and hemiparesis following cerebral infarction affecting left non-dominant side: Secondary | ICD-10-CM | POA: Diagnosis not present

## 2016-02-27 DIAGNOSIS — Z96652 Presence of left artificial knee joint: Secondary | ICD-10-CM | POA: Diagnosis not present

## 2016-02-27 DIAGNOSIS — S82852D Displaced trimalleolar fracture of left lower leg, subsequent encounter for closed fracture with routine healing: Secondary | ICD-10-CM | POA: Diagnosis not present

## 2016-02-27 DIAGNOSIS — Z9181 History of falling: Secondary | ICD-10-CM | POA: Diagnosis not present

## 2016-03-01 DIAGNOSIS — N611 Abscess of the breast and nipple: Secondary | ICD-10-CM | POA: Diagnosis not present

## 2016-03-01 DIAGNOSIS — M1990 Unspecified osteoarthritis, unspecified site: Secondary | ICD-10-CM | POA: Diagnosis not present

## 2016-03-01 DIAGNOSIS — Z96652 Presence of left artificial knee joint: Secondary | ICD-10-CM | POA: Diagnosis not present

## 2016-03-01 DIAGNOSIS — I10 Essential (primary) hypertension: Secondary | ICD-10-CM | POA: Diagnosis not present

## 2016-03-01 DIAGNOSIS — Z9181 History of falling: Secondary | ICD-10-CM | POA: Diagnosis not present

## 2016-03-01 DIAGNOSIS — S82852D Displaced trimalleolar fracture of left lower leg, subsequent encounter for closed fracture with routine healing: Secondary | ICD-10-CM | POA: Diagnosis not present

## 2016-03-01 DIAGNOSIS — I69354 Hemiplegia and hemiparesis following cerebral infarction affecting left non-dominant side: Secondary | ICD-10-CM | POA: Diagnosis not present

## 2016-03-01 DIAGNOSIS — H542 Low vision, both eyes: Secondary | ICD-10-CM | POA: Diagnosis not present

## 2016-03-02 DIAGNOSIS — Z9181 History of falling: Secondary | ICD-10-CM | POA: Diagnosis not present

## 2016-03-02 DIAGNOSIS — Z96652 Presence of left artificial knee joint: Secondary | ICD-10-CM | POA: Diagnosis not present

## 2016-03-02 DIAGNOSIS — S82852D Displaced trimalleolar fracture of left lower leg, subsequent encounter for closed fracture with routine healing: Secondary | ICD-10-CM | POA: Diagnosis not present

## 2016-03-02 DIAGNOSIS — H542 Low vision, both eyes: Secondary | ICD-10-CM | POA: Diagnosis not present

## 2016-03-02 DIAGNOSIS — I10 Essential (primary) hypertension: Secondary | ICD-10-CM | POA: Diagnosis not present

## 2016-03-02 DIAGNOSIS — M1990 Unspecified osteoarthritis, unspecified site: Secondary | ICD-10-CM | POA: Diagnosis not present

## 2016-03-02 DIAGNOSIS — N611 Abscess of the breast and nipple: Secondary | ICD-10-CM | POA: Diagnosis not present

## 2016-03-02 DIAGNOSIS — I69354 Hemiplegia and hemiparesis following cerebral infarction affecting left non-dominant side: Secondary | ICD-10-CM | POA: Diagnosis not present

## 2016-03-03 DIAGNOSIS — M1990 Unspecified osteoarthritis, unspecified site: Secondary | ICD-10-CM | POA: Diagnosis not present

## 2016-03-03 DIAGNOSIS — N611 Abscess of the breast and nipple: Secondary | ICD-10-CM | POA: Diagnosis not present

## 2016-03-03 DIAGNOSIS — I69354 Hemiplegia and hemiparesis following cerebral infarction affecting left non-dominant side: Secondary | ICD-10-CM | POA: Diagnosis not present

## 2016-03-03 DIAGNOSIS — Z9181 History of falling: Secondary | ICD-10-CM | POA: Diagnosis not present

## 2016-03-03 DIAGNOSIS — S82852D Displaced trimalleolar fracture of left lower leg, subsequent encounter for closed fracture with routine healing: Secondary | ICD-10-CM | POA: Diagnosis not present

## 2016-03-03 DIAGNOSIS — Z96652 Presence of left artificial knee joint: Secondary | ICD-10-CM | POA: Diagnosis not present

## 2016-03-03 DIAGNOSIS — I10 Essential (primary) hypertension: Secondary | ICD-10-CM | POA: Diagnosis not present

## 2016-03-03 DIAGNOSIS — H542 Low vision, both eyes: Secondary | ICD-10-CM | POA: Diagnosis not present

## 2016-03-05 DIAGNOSIS — Z96652 Presence of left artificial knee joint: Secondary | ICD-10-CM | POA: Diagnosis not present

## 2016-03-05 DIAGNOSIS — I639 Cerebral infarction, unspecified: Secondary | ICD-10-CM | POA: Diagnosis not present

## 2016-03-05 DIAGNOSIS — I10 Essential (primary) hypertension: Secondary | ICD-10-CM | POA: Diagnosis not present

## 2016-03-05 DIAGNOSIS — N611 Abscess of the breast and nipple: Secondary | ICD-10-CM | POA: Diagnosis not present

## 2016-03-05 DIAGNOSIS — M6281 Muscle weakness (generalized): Secondary | ICD-10-CM | POA: Diagnosis not present

## 2016-03-05 DIAGNOSIS — M1990 Unspecified osteoarthritis, unspecified site: Secondary | ICD-10-CM | POA: Diagnosis not present

## 2016-03-05 DIAGNOSIS — S82843S Displaced bimalleolar fracture of unspecified lower leg, sequela: Secondary | ICD-10-CM | POA: Diagnosis not present

## 2016-03-05 DIAGNOSIS — H542 Low vision, both eyes: Secondary | ICD-10-CM | POA: Diagnosis not present

## 2016-03-05 DIAGNOSIS — Z9181 History of falling: Secondary | ICD-10-CM | POA: Diagnosis not present

## 2016-03-05 DIAGNOSIS — I69354 Hemiplegia and hemiparesis following cerebral infarction affecting left non-dominant side: Secondary | ICD-10-CM | POA: Diagnosis not present

## 2016-03-05 DIAGNOSIS — S82852D Displaced trimalleolar fracture of left lower leg, subsequent encounter for closed fracture with routine healing: Secondary | ICD-10-CM | POA: Diagnosis not present

## 2016-03-08 DIAGNOSIS — M1990 Unspecified osteoarthritis, unspecified site: Secondary | ICD-10-CM | POA: Diagnosis not present

## 2016-03-08 DIAGNOSIS — I10 Essential (primary) hypertension: Secondary | ICD-10-CM | POA: Diagnosis not present

## 2016-03-08 DIAGNOSIS — Z96652 Presence of left artificial knee joint: Secondary | ICD-10-CM | POA: Diagnosis not present

## 2016-03-08 DIAGNOSIS — S82852D Displaced trimalleolar fracture of left lower leg, subsequent encounter for closed fracture with routine healing: Secondary | ICD-10-CM | POA: Diagnosis not present

## 2016-03-08 DIAGNOSIS — H542 Low vision, both eyes: Secondary | ICD-10-CM | POA: Diagnosis not present

## 2016-03-08 DIAGNOSIS — N611 Abscess of the breast and nipple: Secondary | ICD-10-CM | POA: Diagnosis not present

## 2016-03-08 DIAGNOSIS — Z9181 History of falling: Secondary | ICD-10-CM | POA: Diagnosis not present

## 2016-03-08 DIAGNOSIS — I69354 Hemiplegia and hemiparesis following cerebral infarction affecting left non-dominant side: Secondary | ICD-10-CM | POA: Diagnosis not present

## 2016-03-10 DIAGNOSIS — S82852D Displaced trimalleolar fracture of left lower leg, subsequent encounter for closed fracture with routine healing: Secondary | ICD-10-CM | POA: Diagnosis not present

## 2016-03-10 DIAGNOSIS — M1990 Unspecified osteoarthritis, unspecified site: Secondary | ICD-10-CM | POA: Diagnosis not present

## 2016-03-10 DIAGNOSIS — N611 Abscess of the breast and nipple: Secondary | ICD-10-CM | POA: Diagnosis not present

## 2016-03-10 DIAGNOSIS — I10 Essential (primary) hypertension: Secondary | ICD-10-CM | POA: Diagnosis not present

## 2016-03-10 DIAGNOSIS — Z96652 Presence of left artificial knee joint: Secondary | ICD-10-CM | POA: Diagnosis not present

## 2016-03-10 DIAGNOSIS — I69354 Hemiplegia and hemiparesis following cerebral infarction affecting left non-dominant side: Secondary | ICD-10-CM | POA: Diagnosis not present

## 2016-03-10 DIAGNOSIS — Z9181 History of falling: Secondary | ICD-10-CM | POA: Diagnosis not present

## 2016-03-10 DIAGNOSIS — H542 Low vision, both eyes: Secondary | ICD-10-CM | POA: Diagnosis not present

## 2016-03-12 DIAGNOSIS — S82852D Displaced trimalleolar fracture of left lower leg, subsequent encounter for closed fracture with routine healing: Secondary | ICD-10-CM | POA: Diagnosis not present

## 2016-03-12 DIAGNOSIS — N611 Abscess of the breast and nipple: Secondary | ICD-10-CM | POA: Diagnosis not present

## 2016-03-12 DIAGNOSIS — I69354 Hemiplegia and hemiparesis following cerebral infarction affecting left non-dominant side: Secondary | ICD-10-CM | POA: Diagnosis not present

## 2016-03-12 DIAGNOSIS — M1990 Unspecified osteoarthritis, unspecified site: Secondary | ICD-10-CM | POA: Diagnosis not present

## 2016-03-12 DIAGNOSIS — Z9181 History of falling: Secondary | ICD-10-CM | POA: Diagnosis not present

## 2016-03-12 DIAGNOSIS — H542 Low vision, both eyes: Secondary | ICD-10-CM | POA: Diagnosis not present

## 2016-03-12 DIAGNOSIS — Z96652 Presence of left artificial knee joint: Secondary | ICD-10-CM | POA: Diagnosis not present

## 2016-03-12 DIAGNOSIS — I10 Essential (primary) hypertension: Secondary | ICD-10-CM | POA: Diagnosis not present

## 2016-03-17 DIAGNOSIS — Z96652 Presence of left artificial knee joint: Secondary | ICD-10-CM | POA: Diagnosis not present

## 2016-03-17 DIAGNOSIS — I69354 Hemiplegia and hemiparesis following cerebral infarction affecting left non-dominant side: Secondary | ICD-10-CM | POA: Diagnosis not present

## 2016-03-17 DIAGNOSIS — S82852D Displaced trimalleolar fracture of left lower leg, subsequent encounter for closed fracture with routine healing: Secondary | ICD-10-CM | POA: Diagnosis not present

## 2016-03-17 DIAGNOSIS — Z9181 History of falling: Secondary | ICD-10-CM | POA: Diagnosis not present

## 2016-03-17 DIAGNOSIS — I10 Essential (primary) hypertension: Secondary | ICD-10-CM | POA: Diagnosis not present

## 2016-03-17 DIAGNOSIS — N611 Abscess of the breast and nipple: Secondary | ICD-10-CM | POA: Diagnosis not present

## 2016-03-17 DIAGNOSIS — M1990 Unspecified osteoarthritis, unspecified site: Secondary | ICD-10-CM | POA: Diagnosis not present

## 2016-03-17 DIAGNOSIS — H542 Low vision, both eyes: Secondary | ICD-10-CM | POA: Diagnosis not present

## 2016-03-18 DIAGNOSIS — I10 Essential (primary) hypertension: Secondary | ICD-10-CM | POA: Diagnosis not present

## 2016-03-18 DIAGNOSIS — M1990 Unspecified osteoarthritis, unspecified site: Secondary | ICD-10-CM | POA: Diagnosis not present

## 2016-03-18 DIAGNOSIS — S82852D Displaced trimalleolar fracture of left lower leg, subsequent encounter for closed fracture with routine healing: Secondary | ICD-10-CM | POA: Diagnosis not present

## 2016-03-18 DIAGNOSIS — I69354 Hemiplegia and hemiparesis following cerebral infarction affecting left non-dominant side: Secondary | ICD-10-CM | POA: Diagnosis not present

## 2016-03-18 DIAGNOSIS — N611 Abscess of the breast and nipple: Secondary | ICD-10-CM | POA: Diagnosis not present

## 2016-03-18 DIAGNOSIS — Z9181 History of falling: Secondary | ICD-10-CM | POA: Diagnosis not present

## 2016-03-18 DIAGNOSIS — Z96652 Presence of left artificial knee joint: Secondary | ICD-10-CM | POA: Diagnosis not present

## 2016-03-18 DIAGNOSIS — H542 Low vision, both eyes: Secondary | ICD-10-CM | POA: Diagnosis not present

## 2016-03-22 DIAGNOSIS — M25561 Pain in right knee: Secondary | ICD-10-CM | POA: Diagnosis not present

## 2016-03-22 DIAGNOSIS — S82842S Displaced bimalleolar fracture of left lower leg, sequela: Secondary | ICD-10-CM | POA: Diagnosis not present

## 2016-03-23 DIAGNOSIS — N611 Abscess of the breast and nipple: Secondary | ICD-10-CM | POA: Diagnosis not present

## 2016-03-23 DIAGNOSIS — Z9181 History of falling: Secondary | ICD-10-CM | POA: Diagnosis not present

## 2016-03-23 DIAGNOSIS — H542 Low vision, both eyes: Secondary | ICD-10-CM | POA: Diagnosis not present

## 2016-03-23 DIAGNOSIS — I69354 Hemiplegia and hemiparesis following cerebral infarction affecting left non-dominant side: Secondary | ICD-10-CM | POA: Diagnosis not present

## 2016-03-23 DIAGNOSIS — I10 Essential (primary) hypertension: Secondary | ICD-10-CM | POA: Diagnosis not present

## 2016-03-23 DIAGNOSIS — S82852D Displaced trimalleolar fracture of left lower leg, subsequent encounter for closed fracture with routine healing: Secondary | ICD-10-CM | POA: Diagnosis not present

## 2016-03-23 DIAGNOSIS — M1990 Unspecified osteoarthritis, unspecified site: Secondary | ICD-10-CM | POA: Diagnosis not present

## 2016-03-23 DIAGNOSIS — Z96652 Presence of left artificial knee joint: Secondary | ICD-10-CM | POA: Diagnosis not present

## 2016-03-25 DIAGNOSIS — I69354 Hemiplegia and hemiparesis following cerebral infarction affecting left non-dominant side: Secondary | ICD-10-CM | POA: Diagnosis not present

## 2016-03-25 DIAGNOSIS — S82852D Displaced trimalleolar fracture of left lower leg, subsequent encounter for closed fracture with routine healing: Secondary | ICD-10-CM | POA: Diagnosis not present

## 2016-03-25 DIAGNOSIS — Z96652 Presence of left artificial knee joint: Secondary | ICD-10-CM | POA: Diagnosis not present

## 2016-03-25 DIAGNOSIS — M1990 Unspecified osteoarthritis, unspecified site: Secondary | ICD-10-CM | POA: Diagnosis not present

## 2016-03-25 DIAGNOSIS — I10 Essential (primary) hypertension: Secondary | ICD-10-CM | POA: Diagnosis not present

## 2016-03-25 DIAGNOSIS — N611 Abscess of the breast and nipple: Secondary | ICD-10-CM | POA: Diagnosis not present

## 2016-03-25 DIAGNOSIS — H542 Low vision, both eyes: Secondary | ICD-10-CM | POA: Diagnosis not present

## 2016-03-25 DIAGNOSIS — Z9181 History of falling: Secondary | ICD-10-CM | POA: Diagnosis not present

## 2016-03-26 DIAGNOSIS — Z9181 History of falling: Secondary | ICD-10-CM | POA: Diagnosis not present

## 2016-03-26 DIAGNOSIS — I69354 Hemiplegia and hemiparesis following cerebral infarction affecting left non-dominant side: Secondary | ICD-10-CM | POA: Diagnosis not present

## 2016-03-26 DIAGNOSIS — N611 Abscess of the breast and nipple: Secondary | ICD-10-CM | POA: Diagnosis not present

## 2016-03-26 DIAGNOSIS — M1990 Unspecified osteoarthritis, unspecified site: Secondary | ICD-10-CM | POA: Diagnosis not present

## 2016-03-26 DIAGNOSIS — S82852D Displaced trimalleolar fracture of left lower leg, subsequent encounter for closed fracture with routine healing: Secondary | ICD-10-CM | POA: Diagnosis not present

## 2016-03-26 DIAGNOSIS — H542 Low vision, both eyes: Secondary | ICD-10-CM | POA: Diagnosis not present

## 2016-03-26 DIAGNOSIS — Z96652 Presence of left artificial knee joint: Secondary | ICD-10-CM | POA: Diagnosis not present

## 2016-03-26 DIAGNOSIS — I10 Essential (primary) hypertension: Secondary | ICD-10-CM | POA: Diagnosis not present

## 2016-03-29 DIAGNOSIS — M1990 Unspecified osteoarthritis, unspecified site: Secondary | ICD-10-CM | POA: Diagnosis not present

## 2016-03-29 DIAGNOSIS — S82852D Displaced trimalleolar fracture of left lower leg, subsequent encounter for closed fracture with routine healing: Secondary | ICD-10-CM | POA: Diagnosis not present

## 2016-03-29 DIAGNOSIS — N611 Abscess of the breast and nipple: Secondary | ICD-10-CM | POA: Diagnosis not present

## 2016-03-29 DIAGNOSIS — I69354 Hemiplegia and hemiparesis following cerebral infarction affecting left non-dominant side: Secondary | ICD-10-CM | POA: Diagnosis not present

## 2016-03-29 DIAGNOSIS — Z9181 History of falling: Secondary | ICD-10-CM | POA: Diagnosis not present

## 2016-03-29 DIAGNOSIS — I10 Essential (primary) hypertension: Secondary | ICD-10-CM | POA: Diagnosis not present

## 2016-03-29 DIAGNOSIS — Z96652 Presence of left artificial knee joint: Secondary | ICD-10-CM | POA: Diagnosis not present

## 2016-03-29 DIAGNOSIS — H542 Low vision, both eyes: Secondary | ICD-10-CM | POA: Diagnosis not present

## 2016-03-31 DIAGNOSIS — H542 Low vision, both eyes: Secondary | ICD-10-CM | POA: Diagnosis not present

## 2016-03-31 DIAGNOSIS — M1990 Unspecified osteoarthritis, unspecified site: Secondary | ICD-10-CM | POA: Diagnosis not present

## 2016-03-31 DIAGNOSIS — N611 Abscess of the breast and nipple: Secondary | ICD-10-CM | POA: Diagnosis not present

## 2016-03-31 DIAGNOSIS — I69354 Hemiplegia and hemiparesis following cerebral infarction affecting left non-dominant side: Secondary | ICD-10-CM | POA: Diagnosis not present

## 2016-03-31 DIAGNOSIS — S82852D Displaced trimalleolar fracture of left lower leg, subsequent encounter for closed fracture with routine healing: Secondary | ICD-10-CM | POA: Diagnosis not present

## 2016-03-31 DIAGNOSIS — I10 Essential (primary) hypertension: Secondary | ICD-10-CM | POA: Diagnosis not present

## 2016-03-31 DIAGNOSIS — Z96652 Presence of left artificial knee joint: Secondary | ICD-10-CM | POA: Diagnosis not present

## 2016-03-31 DIAGNOSIS — Z9181 History of falling: Secondary | ICD-10-CM | POA: Diagnosis not present

## 2016-04-01 DIAGNOSIS — I10 Essential (primary) hypertension: Secondary | ICD-10-CM | POA: Diagnosis not present

## 2016-04-01 DIAGNOSIS — I69354 Hemiplegia and hemiparesis following cerebral infarction affecting left non-dominant side: Secondary | ICD-10-CM | POA: Diagnosis not present

## 2016-04-01 DIAGNOSIS — H542 Low vision, both eyes: Secondary | ICD-10-CM | POA: Diagnosis not present

## 2016-04-01 DIAGNOSIS — N611 Abscess of the breast and nipple: Secondary | ICD-10-CM | POA: Diagnosis not present

## 2016-04-01 DIAGNOSIS — M1990 Unspecified osteoarthritis, unspecified site: Secondary | ICD-10-CM | POA: Diagnosis not present

## 2016-04-01 DIAGNOSIS — Z96652 Presence of left artificial knee joint: Secondary | ICD-10-CM | POA: Diagnosis not present

## 2016-04-01 DIAGNOSIS — S82852D Displaced trimalleolar fracture of left lower leg, subsequent encounter for closed fracture with routine healing: Secondary | ICD-10-CM | POA: Diagnosis not present

## 2016-04-01 DIAGNOSIS — Z9181 History of falling: Secondary | ICD-10-CM | POA: Diagnosis not present

## 2016-04-04 DIAGNOSIS — I639 Cerebral infarction, unspecified: Secondary | ICD-10-CM | POA: Diagnosis not present

## 2016-04-04 DIAGNOSIS — S82843S Displaced bimalleolar fracture of unspecified lower leg, sequela: Secondary | ICD-10-CM | POA: Diagnosis not present

## 2016-04-04 DIAGNOSIS — M6281 Muscle weakness (generalized): Secondary | ICD-10-CM | POA: Diagnosis not present

## 2016-04-04 DIAGNOSIS — M1990 Unspecified osteoarthritis, unspecified site: Secondary | ICD-10-CM | POA: Diagnosis not present

## 2016-04-08 ENCOUNTER — Telehealth: Payer: Self-pay | Admitting: Neurology

## 2016-04-08 NOTE — Telephone Encounter (Signed)
RN call Britta [place]Barbara [place] about a PT order for a DME. Rn stated Dr. Roda ShuttersXu has never [facility] at [facility] or [facility]. PT does not have any appt schedule or any office visits. [facility] was seen [facility] by Dr. Tarry KosNaiping M Xu. Rn stated all future orders should be sent to Dr. Karrie MeresNailping Xu. Pr was seen by that MD for orthopedic issues. Britta MccreedyBarbara verbalized understanding.

## 2016-04-08 NOTE — Telephone Encounter (Signed)
Jamie Haas , [facility] , called and needs clarification on orders. Please call [phone]

## 2016-04-12 DIAGNOSIS — N3281 Overactive bladder: Secondary | ICD-10-CM | POA: Diagnosis not present

## 2016-04-12 DIAGNOSIS — J441 Chronic obstructive pulmonary disease with (acute) exacerbation: Secondary | ICD-10-CM | POA: Diagnosis not present

## 2016-04-12 DIAGNOSIS — F441 Dissociative fugue: Secondary | ICD-10-CM | POA: Diagnosis not present

## 2016-04-12 DIAGNOSIS — M1711 Unilateral primary osteoarthritis, right knee: Secondary | ICD-10-CM | POA: Diagnosis not present

## 2016-04-12 DIAGNOSIS — I10 Essential (primary) hypertension: Secondary | ICD-10-CM | POA: Diagnosis not present

## 2016-04-12 DIAGNOSIS — F341 Dysthymic disorder: Secondary | ICD-10-CM | POA: Diagnosis not present

## 2016-04-13 ENCOUNTER — Other Ambulatory Visit: Payer: Self-pay | Admitting: Orthopaedic Surgery

## 2016-04-14 ENCOUNTER — Telehealth: Payer: Self-pay

## 2016-04-14 NOTE — Telephone Encounter (Signed)
[place]n [place] [facility] and talk to HopeHillary in home health department at [phone],. Rn stated the same fax for orders were sent to the wrong MD. Rn stated Dr. Gershon MusselNaiping Xu is seeing patient at [facility]. Rn stated it was sent to Dr. Marvel Planjindong Xu at [facility] because of the same last name. Rn stated wanted to make sure the orders for therapy are sent to the right MD. Rn stated the same fax was sent this week.Sherron Monday.Hillary stated they will correct the name and send it to the right md.

## 2016-04-18 DIAGNOSIS — M1711 Unilateral primary osteoarthritis, right knee: Secondary | ICD-10-CM | POA: Diagnosis not present

## 2016-04-18 DIAGNOSIS — S82852D Displaced trimalleolar fracture of left lower leg, subsequent encounter for closed fracture with routine healing: Secondary | ICD-10-CM | POA: Diagnosis not present

## 2016-04-21 ENCOUNTER — Inpatient Hospital Stay (HOSPITAL_COMMUNITY)
Admission: RE | Admit: 2016-04-21 | Discharge: 2016-04-21 | Disposition: A | Discharge status: Home / Self Care | Payer: Medicare Other | Source: Ambulatory Visit

## 2016-04-21 NOTE — Progress Notes (Signed)
Spoke with pt daughter Sherilyn Dacostaabatha noble on phone. Pt unable to come to hospital for appointment do to Medicaid transportation issues. Pt daughter asking to re-schedule.

## 2016-04-22 ENCOUNTER — Encounter (HOSPITAL_COMMUNITY)
Admission: RE | Admit: 2016-04-22 | Discharge: 2016-04-22 | Disposition: A | Discharge status: Home / Self Care | Payer: Medicare Other | Source: Ambulatory Visit | Attending: Orthopaedic Surgery | Admitting: Orthopaedic Surgery

## 2016-04-22 ENCOUNTER — Encounter (HOSPITAL_COMMUNITY): Payer: Self-pay

## 2016-04-22 DIAGNOSIS — Z01818 Encounter for other preprocedural examination: Secondary | ICD-10-CM | POA: Diagnosis not present

## 2016-04-22 DIAGNOSIS — Z01812 Encounter for preprocedural laboratory examination: Secondary | ICD-10-CM | POA: Diagnosis not present

## 2016-04-22 DIAGNOSIS — Z79899 Other long term (current) drug therapy: Secondary | ICD-10-CM | POA: Diagnosis not present

## 2016-04-22 DIAGNOSIS — M1711 Unilateral primary osteoarthritis, right knee: Secondary | ICD-10-CM | POA: Diagnosis not present

## 2016-04-22 DIAGNOSIS — I10 Essential (primary) hypertension: Secondary | ICD-10-CM | POA: Diagnosis not present

## 2016-04-22 DIAGNOSIS — Z8673 Personal history of transient ischemic attack (TIA), and cerebral infarction without residual deficits: Secondary | ICD-10-CM | POA: Diagnosis not present

## 2016-04-22 DIAGNOSIS — Z0183 Encounter for blood typing: Secondary | ICD-10-CM | POA: Diagnosis not present

## 2016-04-22 DIAGNOSIS — F17200 Nicotine dependence, unspecified, uncomplicated: Secondary | ICD-10-CM | POA: Diagnosis not present

## 2016-04-22 HISTORY — DX: Major depressive disorder, single episode, unspecified: F32.9

## 2016-04-22 HISTORY — DX: Depression, unspecified: F32.A

## 2016-04-22 LAB — COMPREHENSIVE METABOLIC PANEL
ALBUMIN: 3.6 g/dL (ref 3.5–5.0)
ALK PHOS: 91 U/L (ref 38–126)
ALT: 11 U/L — AB (ref 14–54)
AST: 14 U/L — ABNORMAL LOW (ref 15–41)
Anion gap: 8 (ref 5–15)
BILIRUBIN TOTAL: 0.5 mg/dL (ref 0.3–1.2)
BUN: 10 mg/dL (ref 6–20)
CALCIUM: 10.1 mg/dL (ref 8.9–10.3)
CO2: 28 mmol/L (ref 22–32)
CREATININE: 0.87 mg/dL (ref 0.44–1.00)
Chloride: 106 mmol/L (ref 101–111)
GFR calc non Af Amer: >60 mL/min (ref >60)
GLUCOSE: 91 mg/dL (ref 65–99)
Potassium: 4.3 mmol/L (ref 3.5–5.1)
SODIUM: 142 mmol/L (ref 135–145)
TOTAL PROTEIN: 8 g/dL (ref 6.5–8.1)

## 2016-04-22 LAB — CBC WITH DIFFERENTIAL/PLATELET
Basophils Absolute: 0 10*3/uL (ref 0.0–0.1)
Basophils Relative: 0 %
EOS ABS: 0.5 10*3/uL (ref 0.0–0.7)
Eosinophils Relative: 6 %
HEMATOCRIT: 37 % (ref 36.0–46.0)
HEMOGLOBIN: 11.8 g/dL — AB (ref 12.0–15.0)
LYMPHS ABS: 2.1 10*3/uL (ref 0.7–4.0)
Lymphocytes Relative: 26 %
MCH: 29.1 pg (ref 26.0–34.0)
MCHC: 31.9 g/dL (ref 30.0–36.0)
MCV: 91.1 fL (ref 78.0–100.0)
MONOS PCT: 8 %
Monocytes Absolute: 0.6 10*3/uL (ref 0.1–1.0)
NEUTROS ABS: 4.9 10*3/uL (ref 1.7–7.7)
NEUTROS PCT: 60 %
Platelets: 410 10*3/uL — ABNORMAL HIGH (ref 150–400)
RBC: 4.06 MIL/uL (ref 3.87–5.11)
RDW: 14.1 % (ref 11.5–15.5)
WBC: 8 10*3/uL (ref 4.0–10.5)

## 2016-04-22 LAB — SEDIMENTATION RATE: SED RATE: 76 mm/h — AB (ref 0–22)

## 2016-04-22 LAB — PROTIME-INR
INR: 1.16 (ref 0.00–1.49)
Prothrombin Time: 15 seconds (ref 11.6–15.2)

## 2016-04-22 LAB — SURGICAL PCR SCREEN
MRSA, PCR: NEGATIVE
STAPHYLOCOCCUS AUREUS: NEGATIVE

## 2016-04-22 LAB — APTT: aPTT: 40 seconds — ABNORMAL HIGH (ref 24–37)

## 2016-04-22 LAB — TYPE AND SCREEN
ABO/RH(D): O POS
ANTIBODY SCREEN: NEGATIVE

## 2016-04-22 LAB — C-REACTIVE PROTEIN: CRP: 3 mg/dL — ABNORMAL HIGH (ref <1.0)

## 2016-04-22 NOTE — Progress Notes (Signed)
Pt does not have a cardiologist. Denies cardiac studies PCP Dr Renaye RakersVeita Bland seen last week. OV note requested EKG 12/21/15

## 2016-04-22 NOTE — Progress Notes (Signed)
Pt is unable to give urine sample at PAT appointment. Cup given to daughter to obtain and home and bring DOS

## 2016-04-22 NOTE — Pre-Procedure Instructions (Signed)
Campbell StallGwendolyn L Morath  04/22/2016      [facility] - [place],  - [address] [place] [facility] [place] [id number] Phone: [phone] Fax: [phone]    Your procedure is scheduled on    Thursday  04/29/16  [facility] Admitting at 1015 A.M.  Call this number if you have problems the morning of surgery:  [phone]   Remember:  Do not eat food or drink liquids after midnight.  Take these medicines the morning of surgery with A SIP OF WATER   TYLENOL IF NEEDED, MONTELUKAST (SINGULAIR) , MYRBETRIQ    (STOP DICLOFENAC/ VOLTAREN, CELCOXIB/ CELEBREX, ASPIRIN, IBUPROFEN/ ADVIL/ MOTRIN, GOODY POWDERS/ BC'S, VITAAMINS, HERBAL MEDICINES)   Do not wear jewelry, make-up or nail polish.  Do not wear lotions, powders, or perfumes.  You may wear deodorant.  Do not shave 48 hours prior to surgery.  Men may shave face and neck.  Do not bring valuables to the hospital.  [facility] is not responsible for any belongings or valuables.  Contacts, dentures or bridgework may not be worn into surgery.  Leave your suitcase in the car.  After surgery it may be brought to your room.  For patients admitted to the hospital, discharge time will be determined by your treatment team.  Patients discharged the day of surgery will not be allowed to drive home.   Name and phone number of your driver:    Special instructions:  [place] - Preparing for Surgery  Before surgery, you can play an important role.  Because skin is not sterile, your skin needs to be as free of germs as possible.  You can reduce the number of germs on you skin by washing with CHG (chlorahexidine gluconate) soap before surgery.  CHG is an antiseptic cleaner which kills germs and bonds with the skin to continue killing germs even after washing.  Please DO NOT use if you have an allergy to CHG or antibacterial soaps.  If your skin becomes reddened/irritated stop using the CHG and  inform your nurse when you arrive at Short Stay.  Do not shave (including legs and underarms) for at least 48 hours prior to the first CHG shower.  You may shave your face.  Please follow these instructions carefully:   1.  Shower with CHG Soap the night before surgery and the                                morning of Surgery.  2.  If you choose to wash your hair, wash your hair first as usual with your       normal shampoo.  3.  After you shampoo, rinse your hair and body thoroughly to remove the                      Shampoo.  4.  Use CHG as you would any other liquid soap.  You can apply chg directly       to the skin and wash gently with scrungie or a clean washcloth.  5.  Apply the CHG Soap to your body ONLY FROM THE NECK DOWN.        Do not use on open wounds or open sores.  Avoid contact with your eyes,       ears, mouth and genitals (private parts).  Wash genitals (private parts)  with your normal soap.  6.  Wash thoroughly, paying special attention to the area where your surgery        will be performed.  7.  Thoroughly rinse your body with warm water from the neck down.  8.  DO NOT shower/wash with your normal soap after using and rinsing off       the CHG Soap.  9.  Pat yourself dry with a clean towel.            10.  Wear clean pajamas.            11.  Place clean sheets on your bed the night of your first shower and do not        sleep with pets.  Day of Surgery  Do not apply any lotions/deoderants the morning of surgery.  Please wear clean clothes to the hospital/surgery center.    Please read over the following fact sheets that you were given. Pain Booklet, Coughing and Deep Breathing, Blood Transfusion Information, MRSA Information and Surgical Site Infection Prevention

## 2016-04-23 NOTE — Progress Notes (Signed)
Anesthesia Chart Review:  Pt is a 70 year old female scheduled R total knee arthroplasty, hardware removal L ankle on 04/29/2016 with Dr. Roda ShuttersXu.   PMH includes:  HTN, stroke (2011), brain aneurysm (s/p coiling 2011), depression. Current smoker. BMI 31.5. S/p ORIF L ankle fracture 12/24/15.   Medications include: valsartan-hctz  Preoperative labs reviewed.  PTT 40.  1 view CXR 12/21/15: No active disease  EKG 12/21/15: Sinus rhythm. Abnormal R-wave progression, early transition. ST elevation, consider inferior injury  Reviewed case with Dr. Acey Lavarignan. Will repeat EKG DOS. If acceptable, I anticipate pt can proceed with surgery as scheduled.   Rica Mastngela Magdaleno Lortie, FNP-BC [facility]/Anesthesiology Phone: [phone] 04/23/2016 4:21 PM

## 2016-04-28 MED ORDER — CEFAZOLIN SODIUM-DEXTROSE 2-4 GM/100ML-% IV SOLN
2.0000 g | INTRAVENOUS | Status: AC
  Administered 2016-04-29: 2 g via INTRAVENOUS
  Filled 2016-04-28: qty 100

## 2016-04-28 MED ORDER — BUPIVACAINE LIPOSOME 1.3 % IJ SUSP
20.0000 mL | INTRAMUSCULAR | Status: AC
  Administered 2016-04-29: 20 mL
  Filled 2016-04-28: qty 20

## 2016-04-29 ENCOUNTER — Inpatient Hospital Stay (HOSPITAL_COMMUNITY): Payer: Medicare Other | Admitting: Vascular Surgery

## 2016-04-29 ENCOUNTER — Other Ambulatory Visit: Payer: Self-pay | Admitting: Orthopaedic Surgery

## 2016-04-29 ENCOUNTER — Encounter (HOSPITAL_COMMUNITY): Payer: Self-pay | Admitting: *Deleted

## 2016-04-29 ENCOUNTER — Other Ambulatory Visit: Payer: Self-pay

## 2016-04-29 ENCOUNTER — Inpatient Hospital Stay (HOSPITAL_COMMUNITY): Payer: Medicare Other | Admitting: Emergency Medicine

## 2016-04-29 ENCOUNTER — Inpatient Hospital Stay (HOSPITAL_COMMUNITY): Payer: Medicare Other

## 2016-04-29 ENCOUNTER — Inpatient Hospital Stay (HOSPITAL_COMMUNITY)
Admission: RE | Admit: 2016-04-29 | Discharge: 2016-05-03 | DRG: 470 | Disposition: A | Discharge status: Skilled Nursing Facility | Payer: Medicare Other | Source: Ambulatory Visit | Attending: Orthopaedic Surgery | Admitting: Orthopaedic Surgery

## 2016-04-29 ENCOUNTER — Encounter (HOSPITAL_COMMUNITY)
Admission: RE | Disposition: A | Discharge status: Skilled Nursing Facility | Payer: Self-pay | Source: Ambulatory Visit | Attending: Orthopaedic Surgery

## 2016-04-29 DIAGNOSIS — M1711 Unilateral primary osteoarthritis, right knee: Secondary | ICD-10-CM | POA: Diagnosis not present

## 2016-04-29 DIAGNOSIS — Z9181 History of falling: Secondary | ICD-10-CM | POA: Diagnosis not present

## 2016-04-29 DIAGNOSIS — D62 Acute posthemorrhagic anemia: Secondary | ICD-10-CM | POA: Diagnosis not present

## 2016-04-29 DIAGNOSIS — I639 Cerebral infarction, unspecified: Secondary | ICD-10-CM | POA: Diagnosis not present

## 2016-04-29 DIAGNOSIS — Z471 Aftercare following joint replacement surgery: Secondary | ICD-10-CM | POA: Diagnosis not present

## 2016-04-29 DIAGNOSIS — M069 Rheumatoid arthritis, unspecified: Secondary | ICD-10-CM | POA: Diagnosis not present

## 2016-04-29 DIAGNOSIS — M6281 Muscle weakness (generalized): Secondary | ICD-10-CM | POA: Diagnosis not present

## 2016-04-29 DIAGNOSIS — I10 Essential (primary) hypertension: Secondary | ICD-10-CM | POA: Diagnosis not present

## 2016-04-29 DIAGNOSIS — I69354 Hemiplegia and hemiparesis following cerebral infarction affecting left non-dominant side: Secondary | ICD-10-CM | POA: Diagnosis not present

## 2016-04-29 DIAGNOSIS — M79609 Pain in unspecified limb: Secondary | ICD-10-CM | POA: Diagnosis not present

## 2016-04-29 DIAGNOSIS — M25569 Pain in unspecified knee: Secondary | ICD-10-CM | POA: Diagnosis not present

## 2016-04-29 DIAGNOSIS — Z09 Encounter for follow-up examination after completed treatment for conditions other than malignant neoplasm: Secondary | ICD-10-CM | POA: Diagnosis not present

## 2016-04-29 DIAGNOSIS — Z472 Encounter for removal of internal fixation device: Secondary | ICD-10-CM | POA: Diagnosis not present

## 2016-04-29 DIAGNOSIS — S82852D Displaced trimalleolar fracture of left lower leg, subsequent encounter for closed fracture with routine healing: Secondary | ICD-10-CM | POA: Diagnosis not present

## 2016-04-29 DIAGNOSIS — N3281 Overactive bladder: Secondary | ICD-10-CM | POA: Diagnosis not present

## 2016-04-29 DIAGNOSIS — M1990 Unspecified osteoarthritis, unspecified site: Secondary | ICD-10-CM | POA: Diagnosis not present

## 2016-04-29 DIAGNOSIS — M179 Osteoarthritis of knee, unspecified: Secondary | ICD-10-CM | POA: Diagnosis not present

## 2016-04-29 DIAGNOSIS — E119 Type 2 diabetes mellitus without complications: Secondary | ICD-10-CM | POA: Diagnosis not present

## 2016-04-29 DIAGNOSIS — Z791 Long term (current) use of non-steroidal anti-inflammatories (NSAID): Secondary | ICD-10-CM | POA: Diagnosis not present

## 2016-04-29 DIAGNOSIS — Z96659 Presence of unspecified artificial knee joint: Secondary | ICD-10-CM

## 2016-04-29 DIAGNOSIS — Z419 Encounter for procedure for purposes other than remedying health state, unspecified: Secondary | ICD-10-CM

## 2016-04-29 DIAGNOSIS — F17210 Nicotine dependence, cigarettes, uncomplicated: Secondary | ICD-10-CM | POA: Diagnosis not present

## 2016-04-29 DIAGNOSIS — M25561 Pain in right knee: Secondary | ICD-10-CM | POA: Diagnosis present

## 2016-04-29 DIAGNOSIS — R32 Unspecified urinary incontinence: Secondary | ICD-10-CM | POA: Diagnosis not present

## 2016-04-29 HISTORY — PX: HARDWARE REMOVAL: SHX979

## 2016-04-29 HISTORY — PX: TOTAL KNEE ARTHROPLASTY: SHX125

## 2016-04-29 LAB — URINALYSIS, ROUTINE W REFLEX MICROSCOPIC
BILIRUBIN URINE: NEGATIVE
Glucose, UA: NEGATIVE mg/dL
KETONES UR: NEGATIVE mg/dL
NITRITE: NEGATIVE
PH: 5.5 (ref 5.0–8.0)
Protein, ur: NEGATIVE mg/dL
Specific Gravity, Urine: 1.014 (ref 1.005–1.030)

## 2016-04-29 LAB — CBC
HEMATOCRIT: 32 % — AB (ref 36.0–46.0)
HEMOGLOBIN: 10.1 g/dL — AB (ref 12.0–15.0)
MCH: 28.6 pg (ref 26.0–34.0)
MCHC: 31.6 g/dL (ref 30.0–36.0)
MCV: 90.7 fL (ref 78.0–100.0)
Platelets: 312 10*3/uL (ref 150–400)
RBC: 3.53 MIL/uL — AB (ref 3.87–5.11)
RDW: 14 % (ref 11.5–15.5)
WBC: 16.3 10*3/uL — AB (ref 4.0–10.5)

## 2016-04-29 LAB — URINE MICROSCOPIC-ADD ON

## 2016-04-29 LAB — CREATININE, SERUM
Creatinine, Ser: 0.76 mg/dL (ref 0.44–1.00)
GFR calc non Af Amer: >60 mL/min (ref >60)

## 2016-04-29 SURGERY — ARTHROPLASTY, KNEE, TOTAL
Anesthesia: Monitor Anesthesia Care | Site: Knee | Laterality: Right

## 2016-04-29 MED ORDER — KETOROLAC TROMETHAMINE 30 MG/ML IJ SOLN
30.0000 mg | Freq: Four times a day (QID) | INTRAMUSCULAR | Status: AC | PRN
  Administered 2016-04-30 – 2016-05-01 (×2): 30 mg via INTRAVENOUS
  Filled 2016-04-29 (×2): qty 1

## 2016-04-29 MED ORDER — ENOXAPARIN SODIUM 40 MG/0.4ML ~~LOC~~ SOLN: 40.0000 mg | Freq: Every day | SUBCUTANEOUS | Status: DC

## 2016-04-29 MED ORDER — VALSARTAN-HYDROCHLOROTHIAZIDE 160-12.5 MG PO TABS: 1.0000 | ORAL_TABLET | Freq: Every day | ORAL | Status: DC

## 2016-04-29 MED ORDER — OXYCODONE HCL 5 MG PO TABS: 5.0000 mg | ORAL_TABLET | ORAL | Status: DC | PRN

## 2016-04-29 MED ORDER — NICOTINE 14 MG/24HR TD PT24
14.0000 mg | MEDICATED_PATCH | Freq: Every day | TRANSDERMAL | Status: DC
  Administered 2016-04-29 – 2016-05-03 (×5): 14 mg via TRANSDERMAL
  Filled 2016-04-29 (×5): qty 1

## 2016-04-29 MED ORDER — CHLORHEXIDINE GLUCONATE 4 % EX LIQD: 60.0000 mL | Freq: Once | CUTANEOUS | Status: DC

## 2016-04-29 MED ORDER — ONDANSETRON HCL 4 MG PO TABS: 4.0000 mg | ORAL_TABLET | Freq: Three times a day (TID) | ORAL | Status: DC | PRN

## 2016-04-29 MED ORDER — DIPHENHYDRAMINE HCL 12.5 MG/5ML PO ELIX
25.0000 mg | ORAL_SOLUTION | ORAL | Status: DC | PRN
  Administered 2016-04-29: 25 mg via ORAL
  Filled 2016-04-29: qty 10

## 2016-04-29 MED ORDER — PROPOFOL 10 MG/ML IV BOLUS
INTRAVENOUS | Status: DC | PRN
  Administered 2016-04-29: 20 mg via INTRAVENOUS
  Administered 2016-04-29: 30 mg via INTRAVENOUS

## 2016-04-29 MED ORDER — SORBITOL 70 % SOLN: 30.0000 mL | Freq: Every day | Status: DC | PRN

## 2016-04-29 MED ORDER — TRANEXAMIC ACID 1000 MG/10ML IV SOLN: 1000.0000 mg | INTRAVENOUS | Status: DC

## 2016-04-29 MED ORDER — POLYETHYLENE GLYCOL 3350 17 G PO PACK: 17.0000 g | PACK | Freq: Every day | ORAL | Status: DC | PRN

## 2016-04-29 MED ORDER — HYDROMORPHONE HCL 1 MG/ML IJ SOLN: 0.2500 mg | INTRAMUSCULAR | Status: DC | PRN

## 2016-04-29 MED ORDER — ENOXAPARIN SODIUM 40 MG/0.4ML ~~LOC~~ SOLN
40.0000 mg | SUBCUTANEOUS | Status: DC
  Administered 2016-04-30 – 2016-05-03 (×4): 40 mg via SUBCUTANEOUS
  Filled 2016-04-29 (×4): qty 0.4

## 2016-04-29 MED ORDER — MAGNESIUM CITRATE PO SOLN
1.0000 | Freq: Once | ORAL | Status: AC | PRN
  Administered 2016-05-03: 1 via ORAL
  Filled 2016-04-29: qty 296

## 2016-04-29 MED ORDER — FENTANYL CITRATE (PF) 250 MCG/5ML IJ SOLN
INTRAMUSCULAR | Status: AC
  Filled 2016-04-29: qty 5

## 2016-04-29 MED ORDER — FENTANYL CITRATE (PF) 100 MCG/2ML IJ SOLN
INTRAMUSCULAR | Status: DC | PRN
  Administered 2016-04-29: 100 ug via INTRAVENOUS

## 2016-04-29 MED ORDER — 0.9 % SODIUM CHLORIDE (POUR BTL) OPTIME
TOPICAL | Status: DC | PRN
  Administered 2016-04-29: 1000 mL

## 2016-04-29 MED ORDER — PROPOFOL 500 MG/50ML IV EMUL
INTRAVENOUS | Status: DC | PRN
  Administered 2016-04-29: 50 ug/kg/min via INTRAVENOUS

## 2016-04-29 MED ORDER — MIRABEGRON ER 25 MG PO TB24
25.0000 mg | ORAL_TABLET | Freq: Every day | ORAL | Status: DC
  Administered 2016-04-29 – 2016-05-03 (×5): 25 mg via ORAL
  Filled 2016-04-29 (×5): qty 1

## 2016-04-29 MED ORDER — SODIUM CHLORIDE 0.9 % IJ SOLN
INTRAMUSCULAR | Status: DC | PRN
  Administered 2016-04-29: 40 mL via INTRAVENOUS

## 2016-04-29 MED ORDER — MIDAZOLAM HCL 2 MG/2ML IJ SOLN
INTRAMUSCULAR | Status: AC
  Filled 2016-04-29: qty 2

## 2016-04-29 MED ORDER — PHENYLEPHRINE HCL 10 MG/ML IJ SOLN
INTRAMUSCULAR | Status: DC | PRN
  Administered 2016-04-29 (×3): 80 ug via INTRAVENOUS

## 2016-04-29 MED ORDER — OXYCODONE HCL ER 10 MG PO T12A: 10.0000 mg | EXTENDED_RELEASE_TABLET | Freq: Two times a day (BID) | ORAL | Status: DC

## 2016-04-29 MED ORDER — ALUM & MAG HYDROXIDE-SIMETH 200-200-20 MG/5ML PO SUSP: 30.0000 mL | ORAL | Status: DC | PRN

## 2016-04-29 MED ORDER — ONDANSETRON HCL 4 MG PO TABS: 4.0000 mg | ORAL_TABLET | Freq: Four times a day (QID) | ORAL | Status: DC | PRN

## 2016-04-29 MED ORDER — ACETAMINOPHEN 325 MG PO TABS
650.0000 mg | ORAL_TABLET | Freq: Four times a day (QID) | ORAL | Status: DC | PRN
  Administered 2016-05-02: 650 mg via ORAL
  Filled 2016-04-29: qty 2

## 2016-04-29 MED ORDER — POVIDONE-IODINE 10 % EX SOLN
CUTANEOUS | Status: DC | PRN
  Administered 2016-04-29: 1 via TOPICAL

## 2016-04-29 MED ORDER — SODIUM CHLORIDE 0.9 % IV SOLN
INTRAVENOUS | Status: DC
  Administered 2016-04-30: 02:00:00 via INTRAVENOUS

## 2016-04-29 MED ORDER — PROPOFOL 10 MG/ML IV BOLUS
INTRAVENOUS | Status: AC
  Filled 2016-04-29: qty 20

## 2016-04-29 MED ORDER — TRANEXAMIC ACID 1000 MG/10ML IV SOLN
1000.0000 mg | Freq: Once | INTRAVENOUS | Status: AC
  Administered 2016-04-29: 1000 mg via INTRAVENOUS
  Filled 2016-04-29: qty 10

## 2016-04-29 MED ORDER — BUPIVACAINE IN DEXTROSE 0.75-8.25 % IT SOLN
INTRATHECAL | Status: DC | PRN
  Administered 2016-04-29: 15 mg via INTRATHECAL

## 2016-04-29 MED ORDER — PHENOL 1.4 % MT LIQD: 1.0000 | OROMUCOSAL | Status: DC | PRN

## 2016-04-29 MED ORDER — METOCLOPRAMIDE HCL 5 MG PO TABS: 5.0000 mg | ORAL_TABLET | Freq: Three times a day (TID) | ORAL | Status: DC | PRN

## 2016-04-29 MED ORDER — OXYCODONE HCL 5 MG PO TABS
5.0000 mg | ORAL_TABLET | ORAL | Status: DC | PRN
  Administered 2016-04-29: 10 mg via ORAL
  Administered 2016-04-29 – 2016-04-30 (×2): 15 mg via ORAL
  Administered 2016-05-01: 10 mg via ORAL
  Administered 2016-05-01: 15 mg via ORAL
  Administered 2016-05-01: 10 mg via ORAL
  Administered 2016-05-02 (×2): 15 mg via ORAL
  Administered 2016-05-03: 10 mg via ORAL
  Administered 2016-05-03: 15 mg via ORAL
  Filled 2016-04-29: qty 2
  Filled 2016-04-29 (×3): qty 3
  Filled 2016-04-29 (×2): qty 2
  Filled 2016-04-29: qty 3
  Filled 2016-04-29: qty 2
  Filled 2016-04-29: qty 3
  Filled 2016-04-29 (×2): qty 2
  Filled 2016-04-29: qty 3

## 2016-04-29 MED ORDER — METOCLOPRAMIDE HCL 5 MG/ML IJ SOLN: 5.0000 mg | Freq: Three times a day (TID) | INTRAMUSCULAR | Status: DC | PRN

## 2016-04-29 MED ORDER — MIDAZOLAM HCL 5 MG/5ML IJ SOLN
INTRAMUSCULAR | Status: DC | PRN
  Administered 2016-04-29: 1 mg via INTRAVENOUS

## 2016-04-29 MED ORDER — METHOCARBAMOL 1000 MG/10ML IJ SOLN
500.0000 mg | Freq: Four times a day (QID) | INTRAVENOUS | Status: DC | PRN
  Filled 2016-04-29: qty 5

## 2016-04-29 MED ORDER — SENNOSIDES-DOCUSATE SODIUM 8.6-50 MG PO TABS: 1.0000 | ORAL_TABLET | Freq: Every evening | ORAL | Status: DC | PRN

## 2016-04-29 MED ORDER — CEFAZOLIN SODIUM-DEXTROSE 2-4 GM/100ML-% IV SOLN
2.0000 g | Freq: Four times a day (QID) | INTRAVENOUS | Status: AC
  Administered 2016-04-29 – 2016-04-30 (×2): 2 g via INTRAVENOUS
  Filled 2016-04-29 (×2): qty 100

## 2016-04-29 MED ORDER — IRBESARTAN 150 MG PO TABS
150.0000 mg | ORAL_TABLET | Freq: Every day | ORAL | Status: DC
  Administered 2016-04-29 – 2016-05-02 (×4): 150 mg via ORAL
  Filled 2016-04-29 (×5): qty 1

## 2016-04-29 MED ORDER — ACETAMINOPHEN 500 MG PO TABS
1000.0000 mg | ORAL_TABLET | Freq: Four times a day (QID) | ORAL | Status: AC
  Administered 2016-04-29 – 2016-04-30 (×3): 1000 mg via ORAL
  Filled 2016-04-29 (×3): qty 2

## 2016-04-29 MED ORDER — OXYCODONE HCL ER 10 MG PO T12A
10.0000 mg | EXTENDED_RELEASE_TABLET | Freq: Two times a day (BID) | ORAL | Status: DC
  Administered 2016-04-30 – 2016-05-02 (×6): 10 mg via ORAL
  Filled 2016-04-29 (×8): qty 1

## 2016-04-29 MED ORDER — POLYETHYLENE GLYCOL 3350 17 G PO PACK
17.0000 g | PACK | Freq: Every day | ORAL | Status: DC | PRN
  Administered 2016-05-02 (×2): 17 g via ORAL
  Filled 2016-04-29: qty 1

## 2016-04-29 MED ORDER — LACTATED RINGERS IV SOLN
INTRAVENOUS | Status: DC
  Administered 2016-04-29 (×2): via INTRAVENOUS

## 2016-04-29 MED ORDER — METHOCARBAMOL 500 MG PO TABS
500.0000 mg | ORAL_TABLET | Freq: Four times a day (QID) | ORAL | Status: DC | PRN
  Administered 2016-04-29 – 2016-05-02 (×4): 500 mg via ORAL
  Filled 2016-04-29 (×5): qty 1

## 2016-04-29 MED ORDER — METHOCARBAMOL 750 MG PO TABS: 750.0000 mg | ORAL_TABLET | Freq: Two times a day (BID) | ORAL | Status: DC | PRN

## 2016-04-29 MED ORDER — ONDANSETRON HCL 4 MG/2ML IJ SOLN
4.0000 mg | Freq: Four times a day (QID) | INTRAMUSCULAR | Status: DC | PRN
  Administered 2016-04-29: 4 mg via INTRAVENOUS
  Filled 2016-04-29: qty 2

## 2016-04-29 MED ORDER — DEXAMETHASONE SODIUM PHOSPHATE 10 MG/ML IJ SOLN
10.0000 mg | Freq: Once | INTRAMUSCULAR | Status: AC
  Administered 2016-04-30: 10 mg via INTRAVENOUS
  Filled 2016-04-29: qty 1

## 2016-04-29 MED ORDER — MENTHOL 3 MG MT LOZG
1.0000 | LOZENGE | OROMUCOSAL | Status: DC | PRN
  Filled 2016-04-29: qty 9

## 2016-04-29 MED ORDER — SODIUM CHLORIDE 0.9 % IR SOLN
Status: DC | PRN
  Administered 2016-04-29: 3000 mL
  Administered 2016-04-29: 1000 mL

## 2016-04-29 MED ORDER — PROMETHAZINE HCL 25 MG/ML IJ SOLN: 6.2500 mg | INTRAMUSCULAR | Status: DC | PRN

## 2016-04-29 MED ORDER — ACETAMINOPHEN 650 MG RE SUPP: 650.0000 mg | Freq: Four times a day (QID) | RECTAL | Status: DC | PRN

## 2016-04-29 MED ORDER — MORPHINE SULFATE (PF) 2 MG/ML IV SOLN
1.0000 mg | INTRAVENOUS | Status: DC | PRN
  Administered 2016-04-29: 1 mg via INTRAVENOUS
  Filled 2016-04-29: qty 1

## 2016-04-29 MED ORDER — HYDROCHLOROTHIAZIDE 12.5 MG PO CAPS
12.5000 mg | ORAL_CAPSULE | Freq: Every day | ORAL | Status: DC
  Administered 2016-04-29 – 2016-05-02 (×4): 12.5 mg via ORAL
  Filled 2016-04-29 (×5): qty 1

## 2016-04-29 MED ORDER — MONTELUKAST SODIUM 10 MG PO TABS
10.0000 mg | ORAL_TABLET | Freq: Every day | ORAL | Status: DC
  Administered 2016-04-29 – 2016-05-03 (×5): 10 mg via ORAL
  Filled 2016-04-29 (×5): qty 1

## 2016-04-29 SURGICAL SUPPLY — 66 items
ALCOHOL ISOPROPYL (RUBBING) (MISCELLANEOUS) ×4 IMPLANT
BAG DECANTER FOR FLEXI CONT (MISCELLANEOUS) ×4 IMPLANT
BANDAGE ACE 4X5 VEL STRL LF (GAUZE/BANDAGES/DRESSINGS) ×4 IMPLANT
BANDAGE ELASTIC 6 VELCRO ST LF (GAUZE/BANDAGES/DRESSINGS) ×4 IMPLANT
BANDAGE ESMARK 6X9 LF (GAUZE/BANDAGES/DRESSINGS) ×2 IMPLANT
BENZOIN TINCTURE PRP APPL 2/3 (GAUZE/BANDAGES/DRESSINGS) ×4 IMPLANT
BLADE SAW SGTL 13.0X1.19X90.0M (BLADE) ×4 IMPLANT
BNDG ESMARK 6X9 LF (GAUZE/BANDAGES/DRESSINGS) ×4
BONE CEMENT PALACOS R-G (Orthopedic Implant) ×8 IMPLANT
BOWL SMART MIX CTS (DISPOSABLE) ×4 IMPLANT
CAPT KNEE TOTAL 3 ×4 IMPLANT
CEMENT BONE PALACOS R-G (Orthopedic Implant) ×4 IMPLANT
CLOSURE STERI-STRIP 1/2X4 (GAUZE/BANDAGES/DRESSINGS) ×2
CLOSURE WOUND 1/2 X4 (GAUZE/BANDAGES/DRESSINGS) ×1
CLSR STERI-STRIP ANTIMIC 1/2X4 (GAUZE/BANDAGES/DRESSINGS) ×6 IMPLANT
COVER SURGICAL LIGHT HANDLE (MISCELLANEOUS) ×4 IMPLANT
CUFF TOURNIQUET SINGLE 34IN LL (TOURNIQUET CUFF) ×4 IMPLANT
DRAPE EXTREMITY T 121X128X90 (DRAPE) ×4 IMPLANT
DRAPE INCISE IOBAN 66X45 STRL (DRAPES) IMPLANT
DRAPE ORTHO SPLIT 77X108 STRL (DRAPES) ×4
DRAPE PROXIMA HALF (DRAPES) ×4 IMPLANT
DRAPE SURG 17X11 SM STRL (DRAPES) ×8 IMPLANT
DRAPE SURG ORHT 6 SPLT 77X108 (DRAPES) ×4 IMPLANT
DRESSING AQUACEL AG SP 3.5X10 (GAUZE/BANDAGES/DRESSINGS) ×2 IMPLANT
DRSG AQUACEL AG ADV 3.5X14 (GAUZE/BANDAGES/DRESSINGS) ×4 IMPLANT
DRSG AQUACEL AG SP 3.5X10 (GAUZE/BANDAGES/DRESSINGS) ×4
DRSG MEPILEX BORDER 4X4 (GAUZE/BANDAGES/DRESSINGS) ×4 IMPLANT
DURAPREP 26ML APPLICATOR (WOUND CARE) ×12 IMPLANT
ELECT CAUTERY BLADE 6.4 (BLADE) ×4 IMPLANT
ELECT REM PT RETURN 9FT ADLT (ELECTROSURGICAL) ×4
ELECTRODE REM PT RTRN 9FT ADLT (ELECTROSURGICAL) ×2 IMPLANT
GLOVE SURG SYN 7.5  E (GLOVE) ×4
GLOVE SURG SYN 7.5 E (GLOVE) ×4 IMPLANT
GOWN STRL REIN XL XLG (GOWN DISPOSABLE) ×4 IMPLANT
GOWN STRL REUS W/ TWL LRG LVL3 (GOWN DISPOSABLE) ×2 IMPLANT
GOWN STRL REUS W/TWL LRG LVL3 (GOWN DISPOSABLE) ×2
HANDPIECE INTERPULSE COAX TIP (DISPOSABLE) ×2
HOOD PEEL AWAY FLYTE STAYCOOL (MISCELLANEOUS) ×8 IMPLANT
KIT BASIN OR (CUSTOM PROCEDURE TRAY) ×4 IMPLANT
KIT ROOM TURNOVER OR (KITS) ×4 IMPLANT
MANIFOLD NEPTUNE II (INSTRUMENTS) ×4 IMPLANT
NEEDLE SPNL 18GX3.5 QUINCKE PK (NEEDLE) ×4 IMPLANT
NS IRRIG 1000ML POUR BTL (IV SOLUTION) ×4 IMPLANT
PACK TOTAL JOINT (CUSTOM PROCEDURE TRAY) ×4 IMPLANT
PAD ARMBOARD 7.5X6 YLW CONV (MISCELLANEOUS) ×8 IMPLANT
PEN SKIN MARKING BROAD (MISCELLANEOUS) ×8 IMPLANT
PIN TROCAR 3 INCH (PIN) ×8 IMPLANT
SAW OSC TIP CART 19.5X105X1.3 (SAW) ×4 IMPLANT
SEALER BIPOLAR AQUA 6.0 (INSTRUMENTS) ×4 IMPLANT
SET HNDPC FAN SPRY TIP SCT (DISPOSABLE) ×2 IMPLANT
SPEED PIN ×4 IMPLANT
STAPLER VISISTAT 35W (STAPLE) IMPLANT
STRIP CLOSURE SKIN 1/2X4 (GAUZE/BANDAGES/DRESSINGS) ×3 IMPLANT
SUCTION FRAZIER HANDLE 10FR (MISCELLANEOUS) ×2
SUCTION TUBE FRAZIER 10FR DISP (MISCELLANEOUS) ×2 IMPLANT
SUT ETHILON 2 0 FS 18 (SUTURE) IMPLANT
SUT MNCRL AB 4-0 PS2 18 (SUTURE) IMPLANT
SUT VIC AB 0 CT1 27 (SUTURE) ×4
SUT VIC AB 0 CT1 27XBRD ANBCTR (SUTURE) ×4 IMPLANT
SUT VIC AB 2-0 CT1 27 (SUTURE) ×6
SUT VIC AB 2-0 CT1 TAPERPNT 27 (SUTURE) ×6 IMPLANT
SYR 20CC LL (SYRINGE) ×4 IMPLANT
SYR 50ML LL SCALE MARK (SYRINGE) ×4 IMPLANT
TOWEL OR 17X24 6PK STRL BLUE (TOWEL DISPOSABLE) ×4 IMPLANT
TOWEL OR 17X26 10 PK STRL BLUE (TOWEL DISPOSABLE) ×4 IMPLANT
WRAP KNEE MAXI GEL POST OP (GAUZE/BANDAGES/DRESSINGS) ×4 IMPLANT

## 2016-04-29 NOTE — Progress Notes (Signed)
Ted hose not applied due to having surgery on both extremities.

## 2016-04-29 NOTE — H&P (Signed)
PREOPERATIVE H&P  Chief Complaint: right knee degenerative joint disease, left ankle retained hardware  HPI: Jamie Haas is a 70 y.o. female who presents for surgical treatment of right knee degenerative joint disease, left ankle retained hardware.  She denies any changes in medical history.  Past Medical History  Diagnosis Date  . Hypertension   . 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 Arthritis   . Stroke [facility](HCC) 2011    left sided weakness  . Depression    Past Surgical History  Procedure Laterality Date  . Total knee arthroplasty    . Brain surgery  2011    coils inserted  . Vulvar lesion removal  03/17/2012    Procedure: VULVAR LESION;  Surgeon: Brock Badharles A Harper, MD;  Location: [facility] ORS;  Service: Gynecology;  Laterality: N/A;  CO2 Laser Vaporization Of Condyloma  . Orif ankle fracture Left 12/24/2015    Procedure: OPEN REDUCTION INTERNAL FIXATION (ORIF) ANKLE FRACTURE;  Surgeon: Tarry KosNaiping M Xu, MD;  Location: [facility] OR;  Service: Orthopedics;  Laterality: Left;   Social History   Social History  . Marital Status: Legally Separated    Spouse Name: N/A  . Number of Children: N/A  . Years of Education: N/A   Social History Main Topics  . Smoking status: Current Every Day Smoker -- 1.50 packs/day for 30 years  . Smokeless tobacco: None  . Alcohol Use: No  . Drug Use: No  . Sexual Activity: Not Asked   Other Topics Concern  . None   Social History Narrative   Family History  Problem Relation Age of Onset  . Hypertension Mother   . Osteoarthritis Mother   . Cancer Mother    No Known Allergies Prior to Admission medications   Medication Sig Start Date End Date Taking? Authorizing Provider  acetaminophen (TYLENOL) 500 MG tablet Take 500 mg by mouth 2 (two) times daily as needed for mild pain.   Yes Historical Provider, MD  celecoxib (CELEBREX) 200 MG capsule Take 200 mg by mouth daily.   Yes Historical Provider, MD  diclofenac sodium  (VOLTAREN) 1 % GEL Apply 2 g topically 4 (four) times daily. Patient taking differently: Apply 2 g topically 2 (two) times daily as needed (for knee pain).  12/25/15  Yes Jana HalfNicholas A Taylor, MD  montelukast (SINGULAIR) 10 MG tablet Take 10 mg by mouth daily.    Yes Historical Provider, MD  MYRBETRIQ 25 MG TB24 tablet Take 25 mg by mouth daily. 10/14/15  Yes Historical Provider, MD  nicotine (NICODERM CQ - DOSED IN MG/24 HOURS) 14 mg/24hr patch Place 1 patch (14 mg total) onto the skin daily. 12/25/15  Yes Jana HalfNicholas A Taylor, MD  polyethylene glycol [facility](MIRALAX / GLYCOLAX) packet Take 17 g by mouth daily as needed for mild constipation.   Yes Historical Provider, MD  valsartan-hydrochlorothiazide (DIOVAN-HCT) 160-12.5 MG per tablet Take 1 tablet by mouth daily.   Yes Historical Provider, MD  Misc. Devices (WHEEL CHAIR K1 BASIC DESK ARM) MISC 1 Device by Does not apply route as needed. 12/06/15   Gilda Creasehristopher J Pollina, MD     Positive ROS: All other systems have been reviewed and were otherwise negative with the exception of those mentioned in the HPI and as above.  Physical Exam: General: Alert, no acute distress Cardiovascular: No pedal edema Respiratory: No cyanosis, no use of accessory musculature GI: abdomen soft Skin: No lesions in the area of chief  complaint Neurologic: Sensation intact distally Psychiatric: Patient is competent for consent with normal mood and affect Lymphatic: no lymphedema  MUSCULOSKELETAL: exam stable  Assessment: right knee degenerative joint disease, left ankle retained hardware  Plan: Plan for Procedure(s): RIGHT TOTAL KNEE ARTHROPLASTY HARDWARE REMOVAL LEFT ANKLE  The risks benefits and alternatives were discussed with the patient including but not limited to the risks of nonoperative treatment, versus surgical intervention including infection, bleeding, nerve injury,  blood clots, cardiopulmonary complications, morbidity, mortality, among others, and they were  willing to proceed.   Cheral AlmasXu, Naiping Michael, MD   04/29/2016 11:49 AM

## 2016-04-29 NOTE — Op Note (Addendum)
Total Knee Arthroplasty Procedure Note Jamie Haas Haas [id number] 04/29/2016   Preoperative diagnosis:  1. Right knee osteoarthritis 2. Left ankle hardware  Postoperative diagnosis:same  Operative procedure: Right Right total knee arthroplasty. CPT [phone] Removal of left ankle syndesmosis screw, deep implant. [id number]  Surgeon: Dorris CarnesN. Glee ArvinMichael Enoc Getter, MD  Assistants: Hart CarwinJustin Queen, RNFA  Anesthesia: Spinal, regional  Tourniquet time: less than 90 minutes  Implants used: Smith and Nephew Femur: Legion 4, PS Tibia: Genesis 4 Patella: 29 mm, 7.5 Polyethylene: 11 mm  Indication: Jamie Haas Haas is a 70 y.o. year old female with a history of knee pain. Having failed conservative management, the patient elected to proceed with a total knee arthroplasty.  We have reviewed the risk and benefits of the surgery and they elected to proceed after voicing understanding.  Procedure:  After informed consent was obtained and understanding of the risk were voiced including but not limited to bleeding, infection, damage to surrounding structures including nerves and vessels, blood clots, leg length inequality and the failure to achieve desired results, the operative extremity was marked with verbal confirmation of the patient in the holding area.   The patient was then brought to the operating room and transported to the operating room table in the supine position.  A tourniquet was applied to the operative extremity around the upper thigh. The operative limb was then prepped and draped in the usual sterile fashion and preoperative antibiotics were administered.  A time out was performed prior to the start of surgery confirming the correct extremity, preoperative antibiotic administration, as well as team members, implants and instruments available for the case. Correct surgical site was also confirmed with preoperative radiographs.  We first prepped and draped the left lower leg.  An esmarch bandage was  used as a tourniquet.  Fluoroscopy was used to localize the syndesmotic screw.  An incision was made over the screw.  Sharp dissection carried down to screw.  The soft tissue was cleared.  The screw was removed with a driver.  Xray was used to confirm removal.  The wound was thoroughly irrigated and closed with 2.0 vicryl and 3.0 nylon.  Sterile dressings were applied.  We then turned our attention to the right lower extremity.   The limb was then elevated for exsanguination and the tourniquet was inflated. A midline incision was made and a standard medial parapatellar approach was performed.  The patella was prepared and sized to a 29  mm.  A cover was placed on the patella for protection from retractors.  We then turned our attention to the femur. Posterior cruciate ligament was sacrificed. Start site was drilled in the femur and the intramedullary distal femoral cutting guide was placed, set at 5 degrees valgus, taking 11 mm of distal resection. The distal cut was made. Osteophytes were then removed. Next, the proximal tibial cutting guide was placed with appropriate slope, varus/valgus alignment and depth of resection. The proximal tibial cut was made. Gap blocks were then used to assess the extension gap and alignment, and appropriate soft tissue releases were performed. Attention was turned back to the femur, which was sized using the sizing guide to a size 4. Appropriate rotation of the femoral component was determined using epicondylar axis, Whiteside's line, and assessing the flexion gap under ligament tension. The appropriate size 4-in-1 cutting block was placed and cuts were made. Posterior femoral osteophytes and uncapped bone were then removed with the curved osteotome. The tibia was sized for a size 4 component.  The femoral box-cutting guide was placed and prepared for a PS femoral component. Trial components were placed, and stability was checked in full extension, mid-flexion, and deep flexion.  Proper tibial rotation was determined and marked.  The patella tracked well without a lateral release. Trial components were then removed and tibial preparation performed. A posterior capsular injection comprising of 20 cc of 1.3% exparel and 40 cc of normal saline was performed for postoperative pain control. The bony surfaces were irrigated with a pulse lavage and then dried. Bone cement was vacuum mixed on the back table, and the final components sized above were cemented into place. After cement had finished curing, excess cement was removed. The stability of the construct was re-evaluated throughout a range of motion and found to be acceptable. The trial liner was removed, the knee was copiously irrigated, and the knee was re-evaluated for any excess bone debris. The real polyethylene liner, 11 mm thick, was inserted and checked to ensure the locking mechanism had engaged appropriately. The tourniquet was deflated and hemostasis was achieved. The wound was irrigated with dilute betadine in normal saline, and then again with normal saline. A drain was not placed. Capsular closure was performed with a #1 vicryl, subcutaneous fat closed with a 0 vicryl suture, then subcutaneous tissue closed with interrupted 2.0 vicryl suture. The skin was then closed with a 4.0 monocryl. A sterile dressing was applied.   The patient was awakened in the operating room and taken to recovery in stable condition. All sponge, needle, and instrument counts were correct at the end of the case.  Position: supine  Complications: none.  Time Out: performed   Drains/Packing: none  Estimated blood loss: 50 cc  Returned to Recovery Room: in good condition.   Antibiotics: yes   Mechanical VTE (DVT) Prophylaxis: sequential compression devices, TED thigh-high  Chemical VTE (DVT) Prophylaxis: lovenox  Fluid Replacement  Crystalloid: see anesthesia record Blood: none  FFP: none   Specimens Removed: 1 to pathology    Sponge and Instrument Count Correct? yes   PACU: portable radiograph - knee AP and Lateral   Admission: inpatient status, start PT & OT POD#1  Plan/RTC: Return in 2 weeks for wound check.   Weight Bearing/Load Lower Extremity: full   N. Glee ArvinMichael Osman Calzadilla, MD [facility] [phone] 3:52 PM

## 2016-04-29 NOTE — Transfer of Care (Signed)
Immediate Anesthesia Transfer of Care Note  Patient: Jamie Haas  Procedure(s) Performed: Procedure(s): RIGHT TOTAL KNEE ARTHROPLASTY (Right) HARDWARE REMOVAL LEFT ANKLE (Left)  Patient Location: PACU  Anesthesia Type:MAC and Spinal  Level of Consciousness: awake, alert , oriented and patient cooperative  Airway & Oxygen Therapy: Patient Spontanous Breathing and Patient connected to face mask oxygen  Post-op Assessment: Report given to RN, Post -op Vital signs reviewed and stable and Patient moving all extremities  Post vital signs: Reviewed and stable  Last Vitals:  Filed Vitals:   04/29/16 1303  BP: 157/68  Pulse: 71  Temp: 37.2 C  Resp: 20    Last Pain: There were no vitals filed for this visit.    Patients Stated Pain Goal: 2 (04/29/16 1211)  Complications: No apparent anesthesia complications

## 2016-04-29 NOTE — Anesthesia Procedure Notes (Signed)
Spinal Patient location during procedure: OR Start time: 04/29/2016 1:02 PM End time: 04/29/2016 1:18 PM Staffing Anesthesiologist: Heather RobertsSINGER, Delron Comer Performed by: anesthesiologist  Preanesthetic Checklist Completed: patient identified, surgical consent, pre-op evaluation, timeout performed, IV checked, risks and benefits discussed and monitors and equipment checked Spinal Block Patient position: sitting Prep: DuraPrep Patient monitoring: cardiac monitor, continuous pulse ox and blood pressure Approach: midline Location: L3-4 Injection technique: single-shot Needle Needle type: [place]  Needle gauge: 25 G Needle length: 9 cm Additional Notes Functioning IV was confirmed and monitors were applied. Sterile prep and drape, including hand hygiene and sterile gloves were used. The patient was positioned and the spine was prepped. The skin was anesthetized with lidocaine.  Free flow of clear CSF was obtained prior to injecting local anesthetic into the CSF.  The spinal needle aspirated freely following injection.  The needle was carefully withdrawn.  The patient tolerated the procedure well.

## 2016-04-29 NOTE — Anesthesia Postprocedure Evaluation (Signed)
Anesthesia Post Note  Patient: Jamie StallGwendolyn L Bostwick  Procedure(s) Performed: Procedure(s) (LRB): RIGHT TOTAL KNEE ARTHROPLASTY (Right) HARDWARE REMOVAL LEFT ANKLE (Left)  Patient location during evaluation: PACU Anesthesia Type: Spinal and MAC Level of consciousness: awake and alert Pain management: pain level controlled Vital Signs Assessment: post-procedure vital signs reviewed and stable Respiratory status: spontaneous breathing and respiratory function stable Cardiovascular status: blood pressure returned to baseline and stable Postop Assessment: spinal receding Anesthetic complications: no    Last Vitals:  Filed Vitals:   04/29/16 1303 04/29/16 1558  BP: 157/68   Pulse: 71   Temp: 37.2 C 36.4 C  Resp: 20     Last Pain: There were no vitals filed for this visit.               Beyounce Dickens DANIEL

## 2016-04-29 NOTE — Discharge Instructions (Signed)

## 2016-04-29 NOTE — Anesthesia Preprocedure Evaluation (Addendum)
Anesthesia Evaluation  Patient identified by MRN, date of birth, ID band Patient awake    Reviewed: Allergy & Precautions, NPO status , Patient's Chart, lab work & pertinent test results  History of Anesthesia Complications Negative for: history of anesthetic complications  Airway Mallampati: II  TM Distance: >3 FB Neck ROM: Full    Dental  (+) Edentulous Lower, Edentulous Upper, Dental Advisory Given   Pulmonary Current Smoker,    Pulmonary exam normal        Cardiovascular hypertension, Normal cardiovascular exam     Neuro/Psych PSYCHIATRIC DISORDERS Depression CVA    GI/Hepatic negative GI ROS, Neg liver ROS,   Endo/Other  negative endocrine ROS  Renal/GU negative Renal ROS     Musculoskeletal   Abdominal   Peds  Hematology negative hematology ROS (+)   Anesthesia Other Findings   Reproductive/Obstetrics                            Anesthesia Physical Anesthesia Plan  ASA: III  Anesthesia Plan: MAC and Spinal   Post-op Pain Management:    Induction:   Airway Management Planned: Simple Face Mask and Natural Airway  Additional Equipment:   Intra-op Plan:   Post-operative Plan: Extubation in OR  Informed Consent: I have reviewed the patients History and Physical, chart, labs and discussed the procedure including the risks, benefits and alternatives for the proposed anesthesia with the patient or authorized representative who has indicated his/her understanding and acceptance.   Dental advisory given  Plan Discussed with: CRNA and Anesthesiologist  Anesthesia Plan Comments:         Anesthesia Quick Evaluation

## 2016-04-30 ENCOUNTER — Encounter (HOSPITAL_COMMUNITY): Payer: Self-pay | Admitting: Orthopaedic Surgery

## 2016-04-30 LAB — BASIC METABOLIC PANEL
ANION GAP: 6 (ref 5–15)
BUN: 8 mg/dL (ref 6–20)
CHLORIDE: 107 mmol/L (ref 101–111)
CO2: 27 mmol/L (ref 22–32)
Calcium: 8.6 mg/dL — ABNORMAL LOW (ref 8.9–10.3)
Creatinine, Ser: 0.94 mg/dL (ref 0.44–1.00)
Glucose, Bld: 123 mg/dL — ABNORMAL HIGH (ref 65–99)
POTASSIUM: 3.9 mmol/L (ref 3.5–5.1)
Sodium: 140 mmol/L (ref 135–145)

## 2016-04-30 LAB — CBC
HEMATOCRIT: 29.4 % — AB (ref 36.0–46.0)
HEMOGLOBIN: 9.5 g/dL — AB (ref 12.0–15.0)
MCH: 29.1 pg (ref 26.0–34.0)
MCHC: 32.3 g/dL (ref 30.0–36.0)
MCV: 89.9 fL (ref 78.0–100.0)
Platelets: 310 10*3/uL (ref 150–400)
RBC: 3.27 MIL/uL — ABNORMAL LOW (ref 3.87–5.11)
RDW: 13.9 % (ref 11.5–15.5)
WBC: 10.6 10*3/uL — AB (ref 4.0–10.5)

## 2016-04-30 NOTE — Evaluation (Signed)
Occupational Therapy Evaluation Patient Details Name: Jamie Haas MRN: [id number] DOB: 02-Nov-1946 Today's Date: 04/30/2016    History of Present Illness Pt is a 70 y.o. female now s/p Rt TKA and Lt ankle hardware removal. PMH: hypertension, CVA with Lt hemiparesis, depression, Lt TKA, Lt ankle ORIF.    Clinical Impression   Per daughter, pt requires total care for bathing and dressing and is incontinent of urine, for which she uses an adult diaper. She has been essentially w/c bound since she fractured her ankle in February and requires 2 person assist for sit to stand.  Pt with increased pain and decreased tolerance of activity this visit. Repositioned and applied ice to knee. Will defer OT to SNF.    Follow Up Recommendations  SNF;Supervision/Assistance - 24 hour    Equipment Recommendations       Recommendations for Other Services       Precautions / Restrictions Precautions Precautions: Fall;Knee Precaution Booklet Issued: Yes (comment) Precaution Comments: HEP provided, reviewed knee extension precautions Restrictions Weight Bearing Restrictions: Yes RLE Weight Bearing: Weight bearing as tolerated LLE Weight Bearing: Weight bearing as tolerated      Mobility Bed Mobility Overal bed mobility: Needs Assistance Bed Mobility: Supine to Sit;Sit to Supine     Supine to sit: Max assist Sit to supine: Max assist   General bed mobility comments: attempted to assist pt to sitting at EOB, pt with increased pain and not able to tolerate, returned her to supine  Transfers                 General transfer comment: did not attempt    Balance Overall balance assessment: Needs assistance Sitting-balance support: Single extremity supported Sitting balance-Leahy Scale: Poor Sitting balance - Comments: maintaining single UE support at rail                                    ADL Overall ADL's : At baseline                                              Vision     Perception     Praxis      Pertinent Vitals/Pain Pain Assessment: Faces Faces Pain Scale: Hurts whole lot Pain Location: R knee Pain Descriptors / Indicators: Aching;Guarding;Grimacing Pain Intervention(s): Ice applied;Premedicated before session;Monitored during session     Hand Dominance Left   Extremity/Trunk Assessment Upper Extremity Assessment Upper Extremity Assessment: LUE deficits/detail;RUE deficits/detail RUE Deficits / Details: generalized weakness LUE Deficits / Details: no functional use of L UE from previous stroke LUE Coordination: decreased fine motor   Lower Extremity Assessment Lower Extremity Assessment: Defer to PT evaluation RLE Deficits / Details: unable to raise leg for SLR, max assist needed to move for bed mobility.  LLE Deficits / Details: generalized weakness, assist needed to move for bed mobility.        Communication Communication Communication: No difficulties   Cognition Arousal/Alertness: Awake/alert Behavior During Therapy: WFL for tasks assessed/performed Overall Cognitive Status: History of cognitive impairments - at baseline       Memory: Decreased short-term memory             General Comments       Exercises Exercises: Total Joint     Shoulder Instructions  Home Living Family/patient expects to be discharged to:: Skilled nursing facility Living Arrangements: Children                               Additional Comments: lives with daughter and grandson.       Prior Functioning/Environment Level of Independence: Needs assistance  Gait / Transfers Assistance Needed: reports needing 2 person assist to stand, states she was ambulating with SPC prior to her ankle fx, has been w/c bound since  ADL's / Homemaking Assistance Needed: total assist for sponge bathing and dressing, uses an adult diaper does not use commode, can self feed and groom only.        OT  Diagnosis: Generalized weakness;Acute pain;Cognitive deficits;Hemiplegia dominant side   OT Problem List: Decreased strength;Decreased range of motion;Decreased activity tolerance;Impaired balance (sitting and/or standing);Decreased coordination;Decreased cognition;Decreased safety awareness;Decreased knowledge of use of DME or AE;Impaired tone;Obesity;Impaired UE functional use;Pain   OT Treatment/Interventions:      OT Goals(Current goals can be found in the care plan section) Acute Rehab OT Goals Patient Stated Goal: go for more rehab after the hospital  OT Frequency:     Barriers to D/C:            Co-evaluation              End of Session    Activity Tolerance: Patient limited by pain Patient left: in bed;with call bell/phone within reach;with bed alarm set;with family/visitor present   Time: 1340-1400 OT Time Calculation (min): 20 min Charges:  OT General Charges $OT Visit: 1 Procedure OT Evaluation $OT Eval Moderate Complexity: 1 Procedure G-Codes:    Evern BioMayberry, Azhane Eckart Lynn 04/30/2016, 2:20 PM  [phone]

## 2016-04-30 NOTE — Progress Notes (Signed)
   04/30/16 1000  Clinical Encounter Type  Visited With Patient  Visit Type Spiritual support  Referral From Patient  Consult/Referral To Chaplain  Spiritual Encounters  Spiritual Needs Prayer;Emotional  Stress Factors  Patient Stress Factors Exhausted  CHP responded to [place]. Patient requested prayer for healing after surgery. CHP visited with patient providing emotional support and prayer.  CHP available to follow up as needed. Rodney BoozeGail L Siobhan Zaro 04/30/2016

## 2016-04-30 NOTE — Progress Notes (Signed)
Patient required ongoing pain management for constant pain reported to be minimally relieved.  Patient exhibited unsettled behavior with tearful episodes during any care event or round.  Patient and family teaching provided with patient encouraged to move toward consistent use of oral versus intravenous analgesics.  Brief period of respiratory desaturation with O2 sats in mid 80s.  Patient placed on supplemental O2 at 2L [place] with continuous pulse oximetry monitoring.  O2 sats maintained at 95% or greater.

## 2016-04-30 NOTE — Progress Notes (Signed)
   Subjective:  Patient reports pain as mild.  Had severe pain overnight.  Objective:   VITALS:   Filed Vitals:   04/29/16 1744 04/29/16 2016 04/30/16 0008 04/30/16 0436  BP: 129/66 107/59 123/56 103/60  Pulse: 98 84 108 80  Temp: 98 F (36.7 C) 97.8 F (36.6 C) 98.8 F (37.1 C) 98.5 F (36.9 C)  TempSrc: Oral Axillary Oral Axillary  Resp: 20 19 15 14   Height:      Weight:      SpO2: 98% 100% 93% 89%    Neurologically intact Neurovascular intact Sensation intact distally Intact pulses distally Dorsiflexion/Plantar flexion intact Incision: dressing C/D/I and no drainage No cellulitis present Compartment soft   Lab Results  Component Value Date   WBC 10.6* 04/30/2016   HGB 9.5* 04/30/2016   HCT 29.4* 04/30/2016   MCV 89.9 04/30/2016   PLT 310 04/30/2016     Assessment/Plan:  1 Day Post-Op   - Expected postop acute blood loss anemia - will monitor for symptoms - Up with PT/OT - DVT ppx - SCDs, ambulation, lovenox - patient is going to be slow to mobilized - WBAT operative extremity - Pain control - Discharge planning - anticipate she will need SNF placement - adult diapers for incontinence  Cheral AlmasXu, Naiping Michael 04/30/2016, 9:21 AM [phone]

## 2016-04-30 NOTE — Evaluation (Signed)
Physical Therapy Evaluation Patient Details Name: Jamie StallGwendolyn L Pinedo MRN: [id number] DOB: 1946-04-11 Today's Date: 04/30/2016   History of Present Illness  Pt is a 70 y.o. female now s/p Rt TKA and Lt ankle hardware removal. PMH: hypertension, CVA with Lt hemiparesis, depression, Lt TKA, Lt ankle ORIF.   Clinical Impression  Pt mobilizing slowly and requiring significant assistance at this time. Max assistance needed for bed mobility with LEs and trunk. Unable to safety attempt transfer at this time. Pt reporting that she was needed 2 people to help her to stand at home but was using a SPC to ambulate. Based upon the patient's current mobility, recommending SNF for further rehabilitation following acute stay.  PT to continue to follow and progress as tolerated.   Follow Up Recommendations SNF;Supervision/Assistance - 24 hour    Equipment Recommendations  Other (comment) (to be addressed at next venue)    Recommendations for Other Services       Precautions / Restrictions Precautions Precautions: Fall;Knee Precaution Booklet Issued: Yes (comment) Precaution Comments: HEP provided, reviewed knee extension precautions Restrictions Weight Bearing Restrictions: Yes RLE Weight Bearing: Weight bearing as tolerated LLE Weight Bearing: Weight bearing as tolerated      Mobility  Bed Mobility Overal bed mobility: Needs Assistance Bed Mobility: Supine to Sit;Sit to Supine     Supine to sit: Max assist Sit to supine: Max assist   General bed mobility comments: Max assist needed with bilateral LEs, pt able to provide some assistance with Rt UE at rail.   Transfers                 General transfer comment: unable to safely attempt at this time.   Ambulation/Gait                Stairs            Wheelchair Mobility    Modified Rankin (Stroke Patients Only)       Balance Overall balance assessment: Needs assistance Sitting-balance support: Single extremity  supported Sitting balance-Leahy Scale: Poor Sitting balance - Comments: maintaining single UE support at rail                                     Pertinent Vitals/Pain Pain Assessment: Faces Faces Pain Scale: Hurts even more Pain Location: Rt knee  Pain Descriptors / Indicators: Grimacing Pain Intervention(s): Monitored during session;Limited activity within patient's tolerance (declined ice)    Home Living Family/patient expects to be discharged to:: Skilled nursing facility Living Arrangements: Children               Additional Comments: lives with daughter and grandson.     Prior Function Level of Independence: Needs assistance   Gait / Transfers Assistance Needed: reports needing 2 person assist to stand, states she was ambulating with SPC.            Hand Dominance        Extremity/Trunk Assessment   Upper Extremity Assessment: LUE deficits/detail       LUE Deficits / Details: Pt reports that she does not have functional use of Lt UE since her CVA. Pt not using LUE to assist with bed mobility.    Lower Extremity Assessment: LLE deficits/detail;RLE deficits/detail RLE Deficits / Details: unable to raise leg for SLR, max assist needed to move for bed mobility.  LLE Deficits / Details: generalized weakness, assist needed to move  for bed mobility.      Communication   Communication: No difficulties  Cognition Arousal/Alertness: Awake/alert Behavior During Therapy: WFL for tasks assessed/performed Overall Cognitive Status: No family/caregiver present to determine baseline cognitive functioning                      General Comments      Exercises Total Joint Exercises Ankle Circles/Pumps: AROM;10 reps;Right;Left Knee Flexion: AAROM;Right;10 reps;Seated Goniometric ROM: approx 70 degrees Rt knee flexion      Assessment/Plan    PT Assessment Patient needs continued PT services  PT Diagnosis Difficulty walking   PT Problem  List Decreased strength;Decreased range of motion;Decreased activity tolerance;Decreased balance;Decreased mobility  PT Treatment Interventions DME instruction;Gait training;Functional mobility training;Therapeutic activities;Therapeutic exercise;Patient/family education   PT Goals (Current goals can be found in the Care Plan section) Acute Rehab PT Goals Patient Stated Goal: go for more rehab after the hospital PT Goal Formulation: With patient Time For Goal Achievement: 05/14/16 Potential to Achieve Goals: Fair    Frequency 7X/week   Barriers to discharge        Co-evaluation               End of Session Equipment Utilized During Treatment: Oxygen Activity Tolerance: Patient limited by fatigue Patient left: in bed;with call bell/phone within reach;with bed alarm set;with SCD's reapplied Nurse Communication: Mobility status;Weight bearing status;Precautions;Need for lift equipment         Time: [id number] PT Time Calculation (min) (ACUTE ONLY): 35 min   Charges:   PT Evaluation $PT Eval Moderate Complexity: 1 Procedure PT Treatments $Therapeutic Activity: 8-22 mins   PT G Codes:        Christiane HaBenjamin J. Caitlen Worth, PT, CSCS Pager [phone] Office [phone]  04/30/2016, 12:09 PM

## 2016-04-30 NOTE — Care Management Important Message (Signed)
Important Message  Patient Details  Name: Jamie Haas MRN: [id number] Date of Birth: [birth year]   Medicare Important Message Given:  Yes    Bernadette HoitShoffner, Jamie Haas 04/30/2016, 9:53 AM

## 2016-05-01 NOTE — Progress Notes (Signed)
Subjective: 2 Days Post-Op Procedure(s) (LRB): RIGHT TOTAL KNEE ARTHROPLASTY (Right) HARDWARE REMOVAL LEFT ANKLE (Left) Patient reports pain as moderate.  Some dizziness with sitting up with PT yesterday.  Objective: Vital signs in last 24 hours: Temp:  [97.8 F (36.6 C)-99.2 F (37.3 C)] 99.2 F (37.3 C) (06/24 0558) Pulse Rate:  [78-94] 91 (06/24 0558) Resp:  [16-18] 18 (06/24 0558) BP: (110-137)/(56-76) 133/66 mmHg (06/24 0558) SpO2:  [90 %-94 %] 94 % (06/24 0558)  Intake/Output from previous day: 06/23 0701 - 06/24 0700 In: 720 [P.O.:720] Out: 400 [Urine:400] Intake/Output this shift:     Recent Labs  04/29/16 2020 04/30/16 0434  HGB 10.1* 9.5*    Recent Labs  04/29/16 2020 04/30/16 0434  WBC 16.3* 10.6*  RBC 3.53* 3.27*  HCT 32.0* 29.4*  PLT 312 310    Recent Labs  04/29/16 2020 04/30/16 0434  NA  --  140  K  --  3.9  CL  --  107  CO2  --  27  BUN  --  8  CREATININE 0.76 0.94  GLUCOSE  --  123*  CALCIUM  --  8.6*   No results for input(s): LABPT, INR in the last 72 hours.  Intact pulses distally Dorsiflexion/Plantar flexion intact Incision: dressing C/D/I Compartment soft  Assessment/Plan: 2 Days Post-Op Procedure(s) (LRB): RIGHT TOTAL KNEE ARTHROPLASTY (Right) HARDWARE REMOVAL LEFT ANKLE (Left) Up with therapy Plan to d/c to SNF when appropriate Monitor HgB    GILBERT CLARK 05/01/2016, 7:48 AM

## 2016-05-01 NOTE — Care Management Note (Signed)
Case Management Note  Patient Details  Name: Jamie Haas MRN: [id number] Date of Birth: October 27, 1946  Subjective/Objective:  70 yo F s/p R TKA and L ankle hardware removal               Action/Plan: PT is recommending SNF   Expected Discharge Date:                  Expected Discharge Plan:  Skilled Nursing Facility  In-House Referral:  Clinical Social Work  Discharge planning Services  CM Consult  Post Acute Care Choice:    Choice offered to:     DME Arranged:    DME Agency:     HH Arranged:    [place] Agency:     Status of Service:  In process, will continue to follow  If discussed at Long Length of Stay Meetings, dates discussed:    Additional Comments: met with pt at bedside. Pt is staying with her daughter. Discussed PT recommendations for SNF. She agrees with SNF. She stated that her daughter talked to her about going to [facility]. Discussed referral with Magda Paganini, RN 05/01/2016, 2:42 PM

## 2016-05-01 NOTE — Progress Notes (Signed)
Physical Therapy Treatment Patient Details Name: Jamie StallGwendolyn L Haas MRN: [id number] DOB: 03-Oct-1946 Today's Date: 05/01/2016    History of Present Illness Pt is a 70 y.o. female now s/p Rt TKA and Lt ankle hardware removal. PMH: hypertension, CVA with Lt hemiparesis, depression, Lt TKA, Lt ankle ORIF.     PT Comments    Pt with limited activity tolerance today due to right leg pain. Pt tearful when seated edge of bed due to pain, therefore did not attempt standing and pt returned to supine in bed. RN notified.   Follow Up Recommendations  SNF;Supervision/Assistance - 24 hour     Equipment Recommendations  Other (comment) (to be addressed at next venue)    Precautions / Restrictions Precautions Precautions: Fall;Knee Precaution Comments: HEP provided, reviewed knee extension precautions Restrictions RLE Weight Bearing: Weight bearing as tolerated LLE Weight Bearing: Weight bearing as tolerated    Mobility  Bed Mobility Overal bed mobility: Needs Assistance Bed Mobility: Rolling Rolling: Max assist;+2 for physical assistance   Supine to sit: Max assist;+2 for physical assistance Sit to supine: Max assist;+2 for physical assistance   General bed mobility comments: multimodal cues needed with rolling right<>left x 2 each way and with supine<>sitting edge of bed. increased time needed with pt performing <25%.       Balance Overall balance assessment: Needs assistance Sitting-balance support: Single extremity supported;Feet supported Sitting balance-Leahy Scale: Poor Sitting balance - Comments: min assist progressing to min guard assist in bouts before needing min assist again with left UE support on bed/rail. EOB x 5-6 minutes working on sitting balance in midline and posture.             Cognition Arousal/Alertness: Awake/alert Behavior During Therapy: WFL for tasks assessed/performed Overall Cognitive Status: History of cognitive impairments - at baseline        Memory: Decreased short-term memory              Exercises Total Joint Exercises Ankle Circles/Pumps: AROM;10 reps;Both;Supine Quad Sets: AAROM;Strengthening;Left;10 reps;Supine Heel Slides: AAROM;Strengthening;Right;5 reps;Supine     Pertinent Vitals/Pain Pain Assessment: 0-10 Pain Score: 9  Pain Location: right leg Pain Descriptors / Indicators: Crying;Guarding;Operative site guarding;Aching;Grimacing Pain Intervention(s): Limited activity within patient's tolerance;Monitored during session;Repositioned;Ice applied;Patient requesting pain meds-RN notified     PT Goals (current goals can now be found in the care plan section) Acute Rehab PT Goals Patient Stated Goal: go for more rehab after the hospital PT Goal Formulation: With patient Time For Goal Achievement: 05/14/16 Potential to Achieve Goals: Fair Progress towards PT goals: Progressing toward goals    Frequency  7X/week    PT Plan Current plan remains appropriate    End of Session Equipment Utilized During Treatment: Oxygen Activity Tolerance: Patient limited by fatigue;Patient limited by pain Patient left: in bed;with call bell/phone within reach;with nursing/sitter in room     Time: [id number] PT Time Calculation (min) (ACUTE ONLY): 24 min  Charges:  $Therapeutic Activity: 23-37 mins           Sallyanne KusterBury, Kathy 05/01/2016, 3:16 PM   Sallyanne KusterKathy Bury, PTA, CLT Acute Rehab Services [phone] 05/01/2016, 3:17 PM

## 2016-05-01 NOTE — Clinical Social Work Placement (Signed)
   CLINICAL SOCIAL WORK PLACEMENT  NOTE  Date:  05/01/2016  Patient Details  Name: Campbell StallGwendolyn L Police MRN: [id number] Date of Birth: Jan 15, 1946  Clinical Social Work is seeking post-discharge placement for this patient at the Skilled  Nursing Facility level of care (*CSW will initial, date and re-position this form in  chart as items are completed):  Yes   Patient/family provided with [facility] Clinical Social Work Department's list of facilities offering this level of care within the geographic area requested by the patient (or if unable, by the patient's family).  Yes   Patient/family informed of their freedom to choose among providers that offer the needed level of care, that participate in Medicare, Medicaid or managed care program needed by the patient, have an available bed and are willing to accept the patient.  Yes   Patient/family informed of [facility]'s ownership interest in [facility] and [facility], as well as of the fact that they are under no obligation to receive care at these facilities.  PASRR submitted to EDS on       PASRR number received on       Existing PASRR number confirmed on 05/01/16     FL2 transmitted to all facilities in geographic area requested by pt/family on 05/01/16     FL2 transmitted to all facilities within larger geographic area on       Patient informed that his/her managed care company has contracts with or will negotiate with certain facilities, including the following:            Patient/family informed of bed offers received.  Patient chooses bed at       Physician recommends and patient chooses bed at      Patient to be transferred to   on  .  Patient to be transferred to facility by       Patient family notified on   of transfer.  Name of family member notified:        PHYSICIAN Please prepare priority discharge summary, including medications, Please prepare prescriptions, Please sign FL2     Additional Comment:     _______________________________________________ Terald Sleeperakiyah T Codey Burling, LCSW 05/01/2016, 4:38 PM

## 2016-05-01 NOTE — Clinical Social Work Note (Signed)
Clinical Social Work Assessment  Patient Details  Name: Jamie Haas MRN: [id number] Date of Birth: 01/05/1946  Date of referral:  05/01/16               Reason for consult:  Discharge Planning                Permission sought to share information with:  Case Manager, Facility Contact Representative, Family Supports Permission granted to share information::  Yes, Verbal Permission Granted  Name::        Agency::   (SNF)  Relationship::     Contact Information:     Housing/Transportation Living arrangements for the past 2 months:  Single Family Home Source of Information:  Patient, Medical Team Patient Interpreter Needed:  None Criminal Activity/Legal Involvement Pertinent to Current Situation/Hospitalization:  No - Comment as needed Significant Relationships:  Adult Children Lives with:  Self Do you feel safe going back to the place where you live?  No Need for family participation in patient care:  Yes (Comment)  Care giving concerns: Pt and family did not express any care giving concerns at this time. Pt and family open to SNF for higher level of care.    Social Worker assessment / plan: Clinical Social Worker met with pt and discussed CSW role with discharge planning. CSW informed pt of PT recommendation for SNF,  Pt stated she would like [facility] for SNF. Pt requested CSW speak with her daughter to assist with discharge planning. CSW spoke with pt daughter via phone and dtr confirmed family would like [facility] because this SNF is close to dtr's home.   CSW will follow up with bed offers once available.   Employment status:  Retired Insurance information:  Medicare, Other (Comment Required) ([employer]) PT Recommendations:  Skilled Nursing Facility Information / Referral to community resources:  Skilled Nursing Facility  Patient/Family's Response to care:Pt sitting up in bed and stated that she is drowsy from medication. Pt understands SNF  recommendation  And pt appears happy with care she is receiving at [facility].   Patient/Family's Understanding of and Emotional Response to Diagnosis, Current Treatment, and Prognosis: Pt and dtr appear to have a good understanding of reason for patient admission into the hospital and with pt care plan.   Emotional Assessment Appearance:  Appears stated age, Well-Groomed Attitude/Demeanor/Rapport:   (Pleasant ) Affect (typically observed):  Accepting, Appropriate Orientation:  Oriented to Self, Oriented to Place, Oriented to  Time, Oriented to Situation Alcohol / Substance use:  Not Applicable Psych involvement (Current and /or in the community):  No (Comment)  Discharge Needs  Concerns to be addressed:  Discharge Planning Concerns Readmission within the last 30 days:  No Current discharge risk:  Lives alone Barriers to Discharge:  Continued Medical Work up   Takiyah T Lee, LCSW 05/01/2016, 4:07 PM  

## 2016-05-01 NOTE — NC FL2 (Signed)
[place] MEDICAID FL2 LEVEL OF CARE SCREENING TOOL     IDENTIFICATION  Patient Name: Jamie Haas Birthdate: 1946/03/18 Sex: female Admission Date (Current Location): 04/29/2016  [facility] [place]   Facility and Address:  [facility] [place], [address], [place], [place] [address]      Provider Number: [id number]  Attending Physician Name and Address:  Tarry KosNaiping M Xu, MD  Relative Name and Phone Number:       Current Level of Care: Hospital Recommended Level of Care: Skilled Nursing Facility Prior Approval Number:    Date Approved/Denied:   PASRR Number:  ([id number] )  Discharge Plan: SNF    Current Diagnoses: Patient Active Problem List   Diagnosis Date Noted  . 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 Ankle fracture 12/21/2015  . Trimalleolar fracture of ankle, closed 12/21/2015  . Pressure ulcer 12/21/2015  . Ankle fracture, bimalleolar, closed   . Essential hypertension   . Arthritis, senescent     Orientation RESPIRATION BLADDER Height & Weight     Self, Time, Situation, Place  Normal Incontinent Weight: 184 lb (83.462 kg) Height:  5\' 5"  (165.1 cm)  BEHAVIORAL SYMPTOMS/MOOD NEUROLOGICAL BOWEL NUTRITION STATUS   (None)  (None) Continent Diet (Regular)  AMBULATORY STATUS COMMUNICATION OF NEEDS Skin   Extensive Assist Verbally Surgical wounds                       Personal Care Assistance Level of Assistance  Bathing, Feeding, Dressing Bathing Assistance: Limited assistance Feeding assistance: Independent Dressing Assistance: Limited assistance     Functional Limitations Info  Sight, Hearing, Speech Sight Info: Adequate Hearing Info: Adequate Speech Info: Adequate    SPECIAL CARE FACTORS FREQUENCY  PT (By licensed PT), OT (By licensed OT)     PT Frequency:  (7X/WEEK) OT Frequency:  (5X/WEEK)            Contractures Contractures Info: Not present    Additional Factors Info  Code  Status, Allergies Code Status Info:  (Full) Allergies Info:  (NKDA)           Current Medications (05/01/2016):  This is the current hospital active medication list Current Facility-Administered Medications  Medication Dose Route Frequency Provider Last Rate Last Dose  . 0.9 %  sodium chloride infusion   Intravenous Continuous Tarry KosNaiping M Xu, MD 125 mL/hr at 04/30/16 0205    . acetaminophen (TYLENOL) tablet 650 mg  650 mg Oral Q6H PRN Naiping Donnelly StagerM Xu, MD       Or  . acetaminophen (TYLENOL) suppository 650 mg  650 mg Rectal Q6H PRN Naiping Donnelly StagerM Xu, MD      . alum & mag hydroxide-simeth (MAALOX/MYLANTA) 200-200-20 MG/5ML suspension 30 mL  30 mL Oral Q4H PRN Naiping Donnelly StagerM Xu, MD      . diphenhydrAMINE (BENADRYL) 12.5 MG/5ML elixir 25 mg  25 mg Oral Q4H PRN Tarry KosNaiping M Xu, MD   25 mg at 04/29/16 2155  . enoxaparin (LOVENOX) injection 40 mg  40 mg Subcutaneous Q24H Naiping Donnelly StagerM Xu, MD   40 mg at 05/01/16 0524  . hydrochlorothiazide (MICROZIDE) capsule 12.5 mg  12.5 mg Oral Daily Naiping Donnelly StagerM Xu, MD   12.5 mg at 05/01/16 0926  . irbesartan (AVAPRO) tablet 150 mg  150 mg Oral Daily Tarry KosNaiping M Xu, MD   150 mg at 05/01/16 0926  . ketorolac (TORADOL) 30 MG/ML injection 30 mg  30 mg Intravenous Q6H PRN Naiping  Donnelly StagerM Xu, MD   30 mg at 05/01/16 1429  . lactated ringers infusion   Intravenous Continuous Heather RobertsJames Singer, MD 10 mL/hr at 04/29/16 1213    . magnesium citrate solution 1 Bottle  1 Bottle Oral Once PRN Naiping Donnelly StagerM Xu, MD      . menthol-cetylpyridinium (CEPACOL) lozenge 3 mg  1 lozenge Oral PRN Naiping Donnelly StagerM Xu, MD       Or  . phenol (CHLORASEPTIC) mouth spray 1 spray  1 spray Mouth/Throat PRN Naiping Donnelly StagerM Xu, MD      . methocarbamol (ROBAXIN) tablet 500 mg  500 mg Oral Q6H PRN Tarry KosNaiping M Xu, MD   500 mg at 04/30/16 1710   Or  . methocarbamol (ROBAXIN) 500 mg in dextrose 5 % 50 mL IVPB  500 mg Intravenous Q6H PRN Naiping Donnelly StagerM Xu, MD      . metoCLOPramide (REGLAN) tablet 5-10 mg  5-10 mg Oral Q8H PRN Naiping Donnelly StagerM Xu, MD       Or  .  metoCLOPramide (REGLAN) injection 5-10 mg  5-10 mg Intravenous Q8H PRN Tarry KosNaiping M Xu, MD      . mirabegron [facility](MYRBETRIQ) tablet 25 mg  25 mg Oral Daily Naiping Donnelly StagerM Xu, MD   25 mg at 05/01/16 0926  . montelukast (SINGULAIR) tablet 10 mg  10 mg Oral Daily Naiping Donnelly StagerM Xu, MD   10 mg at 05/01/16 0926  . morphine 2 MG/ML injection 1 mg  1 mg Intravenous Q2H PRN Tarry KosNaiping M Xu, MD   1 mg at 04/29/16 1924  . nicotine (NICODERM CQ - dosed in mg/24 hours) patch 14 mg  14 mg Transdermal Daily Naiping Donnelly StagerM Xu, MD   14 mg at 05/01/16 0927  . ondansetron (ZOFRAN) tablet 4 mg  4 mg Oral Q6H PRN Naiping Donnelly StagerM Xu, MD       Or  . ondansetron [facility](ZOFRAN) injection 4 mg  4 mg Intravenous Q6H PRN Tarry KosNaiping M Xu, MD   4 mg at 04/29/16 1859  . oxyCODONE (Oxy IR/ROXICODONE) immediate release tablet 5-15 mg  5-15 mg Oral Q3H PRN Tarry KosNaiping M Xu, MD   15 mg at 05/01/16 1428  . oxyCODONE (OXYCONTIN) 12 hr tablet 10 mg  10 mg Oral Q12H Naiping Donnelly StagerM Xu, MD   10 mg at 05/01/16 0926  . polyethylene glycol (MIRALAX / GLYCOLAX) packet 17 g  17 g Oral Daily PRN Naiping Donnelly StagerM Xu, MD      . sorbitol 70 % solution 30 mL  30 mL Oral Daily PRN Naiping Donnelly StagerM Xu, MD         Discharge Medications: Please see discharge summary for a list of discharge medications.  Relevant Imaging Results:  Relevant Lab Results:   Additional Information SS#:[id number]  Terald Sleeperakiyah T Ardean Simonich, LCSW

## 2016-05-02 LAB — CBC
HEMATOCRIT: 28.9 % — AB (ref 36.0–46.0)
HEMOGLOBIN: 9.2 g/dL — AB (ref 12.0–15.0)
MCH: 28.5 pg (ref 26.0–34.0)
MCHC: 31.8 g/dL (ref 30.0–36.0)
MCV: 89.5 fL (ref 78.0–100.0)
Platelets: 297 10*3/uL (ref 150–400)
RBC: 3.23 MIL/uL — ABNORMAL LOW (ref 3.87–5.11)
RDW: 14.2 % (ref 11.5–15.5)
WBC: 10.5 10*3/uL (ref 4.0–10.5)

## 2016-05-02 NOTE — Progress Notes (Signed)
Physical Therapy Treatment Patient Details Name: Jamie Haas MRN: [id number] DOB: 1946/01/13 Today's Date: 05/02/2016    History of Present Illness Pt is a 70 y.o. female now s/p Rt TKA and Lt ankle hardware removal. PMH: hypertension, CVA with Lt hemiparesis, depression, Lt TKA, Lt ankle ORIF.     PT Comments    Pt continues to demo decreased strength and inability to safely stand with assistance post surgeries. Will continue to see pt to work toward PT goals. Will possibly try sara + lift next session for sit<>stand transfers.  Follow Up Recommendations  SNF;Supervision/Assistance - 24 hour     Equipment Recommendations  Other (comment) (TBD at next venue)    Precautions / Restrictions Precautions Precautions: Fall;Knee Restrictions RLE Weight Bearing: Weight bearing as tolerated LLE Weight Bearing: Weight bearing as tolerated    Mobility  Bed Mobility         Supine to sit: Max assist;+2 for physical assistance;HOB elevated Sit to supine: Max assist;+2 for physical assistance;HOB elevated   General bed mobility comments: multimodal cues needed for supine<>sit at edge of bed with HOB elevated and pad under pt used to move pt to edge of bed. Pt performing <25%.   Transfers Overall transfer level: Needs assistance   Transfers: Sit to/from Stand           General transfer comment: attempted sit<>stand x 3 reps using steady with 2 person max assist. Unable to clear bed surface with any of the attempts with pt pulling up with right UE on bar of steady.. Pt also needed guarding/assist to keep feet from sliding forward off footplate of steady.         Balance Overall balance assessment: Needs assistance Sitting-balance support: Single extremity supported;Feet supported Sitting balance-Leahy Scale: Fair Sitting balance - Comments: pt min guard assist for sitting balance at edge of bed today for 4-5 minutes.                   Cognition  Arousal/Alertness: Awake/alert Behavior During Therapy: WFL for tasks assessed/performed Overall Cognitive Status: History of cognitive impairments - at baseline       Memory: Decreased short-term memory         Exercises Total Joint Exercises Ankle Circles/Pumps: AROM;Right;10 reps;Supine Quad Sets: AAROM;Strengthening;Right;10 reps;Supine Heel Slides: AAROM;Strengthening;Right;10 reps;Supine Straight Leg Raises: AAROM;Strengthening;Right;10 reps;Supine Goniometric ROM: seated edge of mat: 85 degrees of right knee flexion     Pertinent Vitals/Pain Pain Assessment: 0-10 Pain Score: 8  Pain Location: right leg Pain Descriptors / Indicators: Aching;Crying;Operative site guarding;Grimacing Pain Intervention(s): Limited activity within patient's tolerance;Monitored during session;Premedicated before session;Repositioned;Ice applied     PT Goals (current goals can now be found in the care plan section) Acute Rehab PT Goals Patient Stated Goal: go for more rehab after the hospital PT Goal Formulation: With patient Time For Goal Achievement: 05/14/16 Potential to Achieve Goals: Fair Progress towards PT goals: Progressing toward goals    Frequency  7X/week    PT Plan Current plan remains appropriate    End of Session Equipment Utilized During Treatment: Gait belt;Other (comment) (steady) Activity Tolerance: Patient tolerated treatment well;Patient limited by fatigue;Patient limited by pain Patient left: in bed;with call bell/phone within reach;with bed alarm set;with family/visitor present     Time: 1130-1206 PT Time Calculation (min) (ACUTE ONLY): 36 min  Charges:  $Therapeutic Exercise: 8-22 mins $Therapeutic Activity: 8-22 mins           Sallyanne KusterBury, Jennessa Trigo 05/02/2016, 12:25 PM   Olegario MessierKathy  Larae GroomsBury, PTA, CLT Acute Rehab Services [phone] 05/02/2016, 12:26 PM

## 2016-05-02 NOTE — Progress Notes (Signed)
Subjective: 3 Days Post-Op Procedure(s) (LRB): RIGHT TOTAL KNEE ARTHROPLASTY (Right) HARDWARE REMOVAL LEFT ANKLE (Left) Patient reports pain as moderate.  Very slow progress with PT. Reports dizziness with sitting up in chair yesterday. Denies chest pain or SOB.   Objective: Vital signs in last 24 hours: Temp:  [97.8 F (36.6 C)-98.9 F (37.2 C)] 98.4 F (36.9 C) (06/25 0620) Pulse Rate:  [92-98] 96 (06/25 0620) Resp:  [14-18] 14 (06/25 0620) BP: (88-134)/(58-64) 95/64 mmHg (06/25 0620) SpO2:  [93 %-100 %] 100 % (06/25 0620)  Intake/Output from previous day:   Intake/Output this shift:     Recent Labs  04/29/16 2020 04/30/16 0434  HGB 10.1* 9.5*    Recent Labs  04/29/16 2020 04/30/16 0434  WBC 16.3* 10.6*  RBC 3.53* 3.27*  HCT 32.0* 29.4*  PLT 312 310    Recent Labs  04/29/16 2020 04/30/16 0434  NA  --  140  K  --  3.9  CL  --  107  CO2  --  27  BUN  --  8  CREATININE 0.76 0.94  GLUCOSE  --  123*  CALCIUM  --  8.6*   No results for input(s): LABPT, INR in the last 72 hours. Right lower extremity: Sensation intact distally Intact pulses distally Dorsiflexion/Plantar flexion intact Incision: scant drainage Compartment soft  Left ankle dressing intact and dry.   Assessment/Plan: 3 Days Post-Op Procedure(s) (LRB): RIGHT TOTAL KNEE ARTHROPLASTY (Right) HARDWARE REMOVAL LEFT ANKLE (Left) Up with therapy  ABLA secondary to surgery will check HgB this morning.    Lorali Khamis 05/02/2016, 8:42 AM

## 2016-05-03 DIAGNOSIS — M25569 Pain in unspecified knee: Secondary | ICD-10-CM | POA: Diagnosis not present

## 2016-05-03 DIAGNOSIS — S82843S Displaced bimalleolar fracture of unspecified lower leg, sequela: Secondary | ICD-10-CM | POA: Diagnosis not present

## 2016-05-03 DIAGNOSIS — I10 Essential (primary) hypertension: Secondary | ICD-10-CM | POA: Diagnosis not present

## 2016-05-03 DIAGNOSIS — M79609 Pain in unspecified limb: Secondary | ICD-10-CM | POA: Diagnosis not present

## 2016-05-03 DIAGNOSIS — K5900 Constipation, unspecified: Secondary | ICD-10-CM | POA: Diagnosis not present

## 2016-05-03 DIAGNOSIS — M1990 Unspecified osteoarthritis, unspecified site: Secondary | ICD-10-CM | POA: Diagnosis not present

## 2016-05-03 DIAGNOSIS — N3281 Overactive bladder: Secondary | ICD-10-CM | POA: Diagnosis not present

## 2016-05-03 DIAGNOSIS — I639 Cerebral infarction, unspecified: Secondary | ICD-10-CM | POA: Diagnosis not present

## 2016-05-03 DIAGNOSIS — G8929 Other chronic pain: Secondary | ICD-10-CM | POA: Diagnosis not present

## 2016-05-03 DIAGNOSIS — Z9181 History of falling: Secondary | ICD-10-CM | POA: Diagnosis not present

## 2016-05-03 DIAGNOSIS — M069 Rheumatoid arthritis, unspecified: Secondary | ICD-10-CM | POA: Diagnosis not present

## 2016-05-03 DIAGNOSIS — E119 Type 2 diabetes mellitus without complications: Secondary | ICD-10-CM | POA: Diagnosis not present

## 2016-05-03 DIAGNOSIS — M6281 Muscle weakness (generalized): Secondary | ICD-10-CM | POA: Diagnosis not present

## 2016-05-03 DIAGNOSIS — Z471 Aftercare following joint replacement surgery: Secondary | ICD-10-CM | POA: Diagnosis not present

## 2016-05-03 NOTE — Clinical Social Work Note (Signed)
[facility]. Patient to be transported via EMS. Patient's daughter updated regarding discharge. RN report number: [id number]  Marcelline Deistmily Jorge Retz, LCSW [phone] Orthopedics: [phone] Surgical: [phone]

## 2016-05-03 NOTE — Clinical Social Work Placement (Signed)
   CLINICAL SOCIAL WORK PLACEMENT  NOTE  Date:  05/03/2016  Patient Details  Name: [name] MRN: [id number] Date of Birth: October 02, 1946  Clinical Social Work is seeking post-discharge placement for this patient at the Skilled  Nursing Facility level of care (*CSW will initial, date and re-position this form in  chart as items are completed):  Yes   Patient/family provided with [facility] Clinical Social Work Department's list of facilities offering this level of care within the geographic area requested by the patient (or if unable, by the patient's family).  Yes   Patient/family informed of their freedom to choose among providers that offer the needed level of care, that participate in Medicare, Medicaid or managed care program needed by the patient, have an available bed and are willing to accept the patient.  Yes   Patient/family informed of [facility]'s ownership interest in [facility] and [facility], as well as of the fact that they are under no obligation to receive care at these facilities.  PASRR submitted to EDS on       PASRR number received on       Existing PASRR number confirmed on 05/01/16     FL2 transmitted to all facilities in geographic area requested by pt/family on 05/01/16     FL2 transmitted to all facilities within larger geographic area on       Patient informed that his/her managed care company has contracts with or will negotiate with certain facilities, including the following:        Yes   Patient/family informed of bed offers received.  [facility]     Physician recommends and patient chooses bed at      Patient to be [facility] on 05/03/16.  Patient to be transferred to facility by PTAR     Patient family notified on 05/03/16 of transfer.  Name of family member notified:  Daughter, Ms. Noble     PHYSICIAN Please prepare priority discharge summary, including  medications, Please prepare prescriptions, Please sign FL2     Additional Comment:    _______________________________________________ Rod MaeVaughn, Benz Vandenberghe S, LCSW 05/03/2016, 11:55 AM

## 2016-05-03 NOTE — Progress Notes (Signed)
Physical Therapy Treatment Patient Details Name: Jamie StallGwendolyn L Way MRN: [id number] DOB: 05-03-1946 Today's Date: 05/03/2016    History of Present Illness Pt is a 70 y.o. female now s/p Rt TKA and Lt ankle hardware removal. PMH: hypertension, CVA with Lt hemiparesis, depression, Lt TKA, Lt ankle ORIF.     PT Comments    Pt performed progression to standing with +2 max assistance.  Pt performed squat pivot with +2 total assist.  Weakness and fear of falling remains to limit progress.  Pt tearful during tx.  Daughter reports patient does not tolerate pain well.    Follow Up Recommendations  SNF;Supervision/Assistance - 24 hour     Equipment Recommendations  Other (comment) (TBD at next venue.  )    Recommendations for Other Services       Precautions / Restrictions Precautions Precautions: Fall;Knee Precaution Booklet Issued: Yes (comment) Precaution Comments: HEP provided, reviewed knee extension precautions Restrictions Weight Bearing Restrictions: Yes RLE Weight Bearing: Weight bearing as tolerated LLE Weight Bearing: Weight bearing as tolerated    Mobility  Bed Mobility Overal bed mobility: Needs Assistance       Supine to sit: Max assist;+2 for physical assistance Sit to supine: Max assist;+2 for physical assistance   General bed mobility comments: Pt performed supine to sit and required cues for LE advancement and heavy assistance to elevate trunk into seated position.  Pt performed with slow movement remains guarded and fights against facilitation of movements.    Transfers Overall transfer level: Needs assistance Equipment used: Rolling walker (2 wheeled) Transfers: Sit to/from Stand Sit to Stand: Max assist;+2 physical assistance;Total assist (max +2 for sit to stand and total +2 for squat pivot .  )         General transfer comment: Pt required cues for hand placement and foot placement.  Pt required blocking of LLE after noticing L foot sliding forward  during transfer.  On second attempt pt able to stand while PTA performed perianal care.  pt remains forward flexed and required cues for upright posture.  Pt performed squat pivot for safe transfer from bed to chair.  Pt crying post transfer and offered little to no assistance requiring total+2 for transfer from bed to chair.    Ambulation/Gait                 Stairs            Wheelchair Mobility    Modified Rankin (Stroke Patients Only)       Balance Overall balance assessment: Needs assistance   Sitting balance-Leahy Scale: Fair Sitting balance - Comments: Pt lean posteriorly at edge of bed holding to rail with R hand and heavy reliance of LUE.                              Cognition Arousal/Alertness: Awake/alert Behavior During Therapy: WFL for tasks assessed/performed Overall Cognitive Status: History of cognitive impairments - at baseline       Memory: Decreased short-term memory              Exercises Total Joint Exercises Ankle Circles/Pumps: AROM;10 reps;Supine;Both Quad Sets: Strengthening;Right;10 reps;Supine;AROM Heel Slides: AAROM;Strengthening;Right;10 reps;Supine Hip ABduction/ADduction: AAROM;Strengthening;Right;10 reps;Supine Straight Leg Raises: AAROM;Strengthening;Right;10 reps;Supine Knee Flexion: AAROM;Right;10 reps;Seated Goniometric ROM: unable to obtain measurement grossly appears 0-80 degrees.      General Comments        Pertinent Vitals/Pain Pain Assessment: 0-10 Faces Pain Scale: Hurts  worst Pain Location: r leg Pain Descriptors / Indicators: Crying;Crushing;Aching;Grimacing;Guarding Pain Intervention(s): Monitored during session;Repositioned;Ice applied    Home Living                      Prior Function            PT Goals (current goals can now be found in the care plan section) Acute Rehab PT Goals Patient Stated Goal: go for more rehab after the hospital Potential to Achieve Goals:  Fair Progress towards PT goals: Progressing toward goals    Frequency  7X/week    PT Plan Current plan remains appropriate    Co-evaluation             End of Session Equipment Utilized During Treatment: Gait belt;Other (comment) Activity Tolerance: Patient tolerated treatment well;Patient limited by fatigue;Patient limited by pain Patient left: in bed;with call bell/phone within reach;with bed alarm set;with family/visitor present     Time: [id number] PT Time Calculation (min) (ACUTE ONLY): 18 min  Charges:  $Therapeutic Activity: 8-22 mins                    G Codes:      Florestine Aversimee J Icy Fuhrmann 05/03/2016, 12:51 PM  Joycelyn RuaAimee Mercedez Boule, PTA pager [phone]

## 2016-05-03 NOTE — Discharge Summary (Signed)
Physician Discharge Summary      Patient ID: Jamie Haas MRN: [id number] DOB/AGE: 03-03-1946 70 y.o.  Admit date: 04/29/2016 Discharge date: 05/03/2016  Admission Diagnoses:  <principal problem not specified>  Discharge Diagnoses:  Active Problems:   Total knee replacement status   Past Medical History  Diagnosis Date  . Hypertension   . 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 Arthritis   . Stroke [facility]) 2011    left sided weakness  . Depression     Surgeries: Procedure(s): RIGHT TOTAL KNEE ARTHROPLASTY HARDWARE REMOVAL LEFT ANKLE on 04/29/2016   Consultants (if any):    Discharged Condition: Improved  Hospital Course: Jamie Haas is an 70 y.o. female who was admitted 04/29/2016 with a diagnosis of <principal problem not specified> and went to the operating room on 04/29/2016 and underwent the above named procedures.    She was given perioperative antibiotics:  Anti-infectives    Start     Dose/Rate Route Frequency Ordered Stop   04/29/16 1930  ceFAZolin (ANCEF) IVPB 2g/100 mL premix     2 g 200 mL/hr over 30 Minutes Intravenous Every 6 hours 04/29/16 1744 04/30/16 0235   04/29/16 1200  ceFAZolin (ANCEF) IVPB 2g/100 mL premix     2 g 200 mL/hr over 30 Minutes Intravenous To [facility] 04/28/16 1244 04/29/16 1322    .  She was given sequential compression devices, early ambulation, and lovenox for DVT prophylaxis.  She benefited maximally from the hospital stay and there were no complications.    Recent vital signs:  Filed Vitals:   05/02/16 2016 05/03/16 0455  BP: 91/59 111/65  Pulse: 110 84  Temp: 101 F (38.3 C) 98.9 F (37.2 C)  Resp: 18 17    Recent laboratory studies:  Lab Results  Component Value Date   HGB 9.2* 05/02/2016   HGB 9.5* 04/30/2016   HGB 10.1* 04/29/2016   Lab Results  Component Value Date   WBC 10.5 05/02/2016   PLT 297 05/02/2016   Lab Results  Component Value Date   INR 1.16  04/22/2016   Lab Results  Component Value Date   NA 140 04/30/2016   K 3.9 04/30/2016   CL 107 04/30/2016   CO2 27 04/30/2016   BUN 8 04/30/2016   CREATININE 0.94 04/30/2016   GLUCOSE 123* 04/30/2016    Discharge Medications:     Medication List    TAKE these medications        acetaminophen 500 MG tablet  Commonly known as:  TYLENOL  Take 500 mg by mouth 2 (two) times daily as needed for mild pain.     celecoxib 200 MG capsule  Commonly known as:  CELEBREX  Take 200 mg by mouth daily.     diclofenac sodium 1 % Gel  Commonly known as:  VOLTAREN  Apply 2 g topically 4 (four) times daily.     enoxaparin 40 MG/0.4ML injection  Commonly known as:  LOVENOX  Inject 0.4 mLs (40 mg total) into the skin daily.     methocarbamol 750 MG tablet  Commonly known as:  ROBAXIN  Take 1 tablet (750 mg total) by mouth 2 (two) times daily as needed for muscle spasms.     montelukast 10 MG tablet  Commonly known as:  SINGULAIR  Take 10 mg by mouth daily.     MYRBETRIQ 25 MG Tb24 tablet  Generic drug:  mirabegron ER  Take 25  mg by mouth daily.     nicotine 14 mg/24hr patch  Commonly known as:  NICODERM CQ - dosed in mg/24 hours  Place 1 patch (14 mg total) onto the skin daily.     ondansetron 4 MG tablet  Commonly known as:  ZOFRAN  Take 1-2 tablets (4-8 mg total) by mouth every 8 (eight) hours as needed for nausea or vomiting.     oxyCODONE 10 mg 12 hr tablet  Commonly known as:  OXYCONTIN  Take 1 tablet (10 mg total) by mouth every 12 (twelve) hours.     oxyCODONE 5 MG immediate release tablet  Commonly known as:  Oxy IR/ROXICODONE  Take 1-3 tablets (5-15 mg total) by mouth every 4 (four) hours as needed.     polyethylene glycol packet  Commonly known as:  MIRALAX / GLYCOLAX  Take 17 g by mouth daily as needed for mild constipation.     senna-docusate 8.6-50 MG tablet  Commonly known as:  SENOKOT S  Take 1 tablet by mouth at bedtime as needed.      valsartan-hydrochlorothiazide 160-12.5 MG tablet  Commonly known as:  DIOVAN-HCT  Take 1 tablet by mouth daily.     Wheel Chair K1 Basic Desk Arm Misc  1 Device by Does not apply route as needed.        Diagnostic Studies: Dg Ankle Complete Left  04/29/2016  CLINICAL DATA:  Left ankle hardware removal Fluoro time was 2 seconds EXAM: DG C-ARM 61-120 MIN; LEFT ANKLE COMPLETE - 3+ VIEW COMPARISON:  03/22/2016 FINDINGS: Two images are submitted, demonstrating a cortical screw traversing the medial malleolus. A lateral plate traverses the lateral malleolus. Surgical device is identified adjacent to cortical screw traversing the distal aspect of the tibia and fibula. IMPRESSION: Intraoperative images demonstrating ORIF of the ankle. Intraoperative localization. Electronically Signed   By: Norva PavlovElizabeth  Brown M.D.   On: 04/29/2016 15:39   Dg Knee Right Port  04/29/2016  CLINICAL DATA:  Right-sided total knee replacement. EXAM: 03/22/2016 COMPARISON:  None. FINDINGS: Status post right knee arthroplasty. There is no acute fracture or traumatic subluxation. Postoperative gas is identified the joint space. IMPRESSION: Status post knee arthroplasty.  No adverse features Electronically Signed   By: Norva PavlovElizabeth  Brown M.D.   On: 04/29/2016 17:11   Dg C-arm 1-60 Min  04/29/2016  CLINICAL DATA:  Left ankle hardware removal Fluoro time was 2 seconds EXAM: DG C-ARM 61-120 MIN; LEFT ANKLE COMPLETE - 3+ VIEW COMPARISON:  03/22/2016 FINDINGS: Two images are submitted, demonstrating a cortical screw traversing the medial malleolus. A lateral plate traverses the lateral malleolus. Surgical device is identified adjacent to cortical screw traversing the distal aspect of the tibia and fibula. IMPRESSION: Intraoperative images demonstrating ORIF of the ankle. Intraoperative localization. Electronically Signed   By: Norva PavlovElizabeth  Brown M.D.   On: 04/29/2016 15:39    Disposition: 03-Skilled Nursing Facility      Discharge  Instructions    Call MD / Call 911    Complete by:  As directed   If you experience chest pain or shortness of breath, CALL 911 and be transported to the hospital emergency room.  If you develope a fever above 101.5 F, pus (white drainage) or increased drainage or redness at the wound, or calf pain, call your surgeon's office.     Constipation Prevention    Complete by:  As directed   Drink plenty of fluids.  Prune juice may be helpful.  You may use a stool softener,  such as Colace (over the counter) 100 mg twice a day.  Use MiraLax (over the counter) for constipation as needed.     Diet - low sodium heart healthy    Complete by:  As directed      Diet general    Complete by:  As directed      Driving restrictions    Complete by:  As directed   No driving while taking narcotic pain meds.     Increase activity slowly as tolerated    Complete by:  As directed            Follow-up Information    Follow up with Cheral AlmasXu, Naiping Michael, MD In 2 weeks.   Specialty:  Orthopedic Surgery   Why:  For suture removal, For wound re-check   Contact information:   [address] [place] [id number] [phone]        Signed: Cheral AlmasXu, Naiping Michael 05/03/2016, 7:51 AM

## 2016-05-03 NOTE — Care Management Important Message (Signed)
Important Message  Patient Details  Name: Jamie Haas MRN: [id number] Date of Birth: Jan 31, 1946   Medicare Important Message Given:  Yes    Bernadette HoitShoffner, Zyasia Halbleib Coleman 05/03/2016, 8:57 AM

## 2016-05-03 NOTE — Progress Notes (Signed)
   Subjective:  Continues to be very emotional with essentially everything.  Objective:   VITALS:   Filed Vitals:   05/02/16 0620 05/02/16 1657 05/02/16 2016 05/03/16 0455  BP: 95/64 87/47 91/59  111/65  Pulse: 96 102 110 84  Temp: 98.4 F (36.9 C) 98.5 F (36.9 C) 101 F (38.3 C) 98.9 F (37.2 C)  TempSrc: Oral Oral Oral   Resp: 14 16 18 17   Height:      Weight:      SpO2: 100% 95% 94% 95%    Neurologically intact Neurovascular intact Sensation intact distally Intact pulses distally Dorsiflexion/Plantar flexion intact Incision: dressing C/D/I and no drainage No cellulitis present Compartment soft   Lab Results  Component Value Date   WBC 10.5 05/02/2016   HGB 9.2* 05/02/2016   HCT 28.9* 05/02/2016   MCV 89.5 05/02/2016   PLT 297 05/02/2016     Assessment/Plan:  4 Days Post-Op   - SNF pending - stable for dc to SNF  Cheral AlmasXu, Zabella Wease Michael 05/03/2016, 7:50 AM [phone]

## 2016-05-03 NOTE — Progress Notes (Signed)
Pt will be d/c to [facility] today per MD order. Report was called to [place] at facility, all questions answered. Pt will be transported via [facility]. All belongings sent with daughter, who will take to facility.   [place], Latricia HeftKorie G

## 2016-05-03 NOTE — Progress Notes (Signed)
   05/03/16 1000  Clinical Encounter Type  Visited With Patient  Visit Type Initial  Spiritual Encounters  Spiritual Needs Emotional;Prayer  Chaplain visited patient on initial rounds.  Patient became very emotional when asked what she would like prayer for.   Patient requested prayer for her family and herself.  Patient's daughter walked in and questioned her mom as to why she was crying.  Patient did not respond.  Chaplain proceeded to pray.  Chaplain made further support available if needed.

## 2016-05-21 ENCOUNTER — Other Ambulatory Visit: Payer: Self-pay | Admitting: Internal Medicine

## 2016-05-21 DIAGNOSIS — Z1231 Encounter for screening mammogram for malignant neoplasm of breast: Secondary | ICD-10-CM

## 2016-05-21 DIAGNOSIS — M8580 Other specified disorders of bone density and structure, unspecified site: Secondary | ICD-10-CM

## 2016-05-26 DIAGNOSIS — G8929 Other chronic pain: Secondary | ICD-10-CM | POA: Diagnosis not present

## 2016-05-26 DIAGNOSIS — K5900 Constipation, unspecified: Secondary | ICD-10-CM | POA: Diagnosis not present

## 2016-05-31 DIAGNOSIS — E119 Type 2 diabetes mellitus without complications: Secondary | ICD-10-CM | POA: Diagnosis not present

## 2016-05-31 DIAGNOSIS — M62442 Contracture of muscle, left hand: Secondary | ICD-10-CM | POA: Diagnosis not present

## 2016-05-31 DIAGNOSIS — I69354 Hemiplegia and hemiparesis following cerebral infarction affecting left non-dominant side: Secondary | ICD-10-CM | POA: Diagnosis not present

## 2016-06-01 ENCOUNTER — Non-Acute Institutional Stay (SKILLED_NURSING_FACILITY): Payer: Medicare Other | Admitting: Adult Health

## 2016-06-01 ENCOUNTER — Encounter: Payer: Self-pay | Admitting: Adult Health

## 2016-06-01 DIAGNOSIS — G894 Chronic pain syndrome: Secondary | ICD-10-CM | POA: Diagnosis not present

## 2016-06-01 DIAGNOSIS — M62442 Contracture of muscle, left hand: Secondary | ICD-10-CM | POA: Diagnosis not present

## 2016-06-01 DIAGNOSIS — S82842S Displaced bimalleolar fracture of left lower leg, sequela: Secondary | ICD-10-CM | POA: Diagnosis not present

## 2016-06-01 DIAGNOSIS — I639 Cerebral infarction, unspecified: Secondary | ICD-10-CM | POA: Diagnosis not present

## 2016-06-01 DIAGNOSIS — E119 Type 2 diabetes mellitus without complications: Secondary | ICD-10-CM | POA: Diagnosis not present

## 2016-06-01 DIAGNOSIS — I69354 Hemiplegia and hemiparesis following cerebral infarction affecting left non-dominant side: Secondary | ICD-10-CM | POA: Diagnosis not present

## 2016-06-01 DIAGNOSIS — I10 Essential (primary) hypertension: Secondary | ICD-10-CM | POA: Diagnosis not present

## 2016-06-01 DIAGNOSIS — Z96651 Presence of right artificial knee joint: Secondary | ICD-10-CM | POA: Diagnosis not present

## 2016-06-01 DIAGNOSIS — J3089 Other allergic rhinitis: Secondary | ICD-10-CM

## 2016-06-01 DIAGNOSIS — N3941 Urge incontinence: Secondary | ICD-10-CM

## 2016-06-01 DIAGNOSIS — F418 Other specified anxiety disorders: Secondary | ICD-10-CM | POA: Diagnosis not present

## 2016-06-01 DIAGNOSIS — R531 Weakness: Secondary | ICD-10-CM | POA: Insufficient documentation

## 2016-06-01 DIAGNOSIS — J309 Allergic rhinitis, unspecified: Secondary | ICD-10-CM | POA: Insufficient documentation

## 2016-06-01 DIAGNOSIS — M6289 Other specified disorders of muscle: Secondary | ICD-10-CM | POA: Diagnosis not present

## 2016-06-01 NOTE — Progress Notes (Signed)
Patient ID: Jamie Haas, female   DOB: [birth year], 70 y.o.   MRN: [id number]    Location:   [facility] Room Number: 133-B Place of Service:  SNF (31)   CODE STATUS: Full Code  No Known Allergies  Chief Complaint  Patient presents with  . Hospitalization Follow-up    Hospital Follow up    HPI:  IN June of this year she had a right knee replacement and hardware removal of left ankle. She has had short term rehab at another snf and transferred to this facility. She is here to continue with her therapy. More than likely this does represent a long term placement for her; she tells me that her goal is to return home with her family. She is not voicing any complaints. There are no nursing concerns at this time.,   Past Medical History:  Diagnosis Date  . 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 Arthritis   . Depression   . Hypertension   . Stroke [facility]) 2011   left sided weakness    Past Surgical History:  Procedure Laterality Date  . BRAIN SURGERY  2011   coils inserted  . HARDWARE REMOVAL Left 04/29/2016   Procedure: HARDWARE REMOVAL LEFT ANKLE;  Surgeon: Tarry Kos, MD;  Location: [facility] OR;  Service: Orthopedics;  Laterality: Left;  . ORIF ANKLE FRACTURE Left 12/24/2015   Procedure: OPEN REDUCTION INTERNAL FIXATION (ORIF) ANKLE FRACTURE;  Surgeon: Tarry Kos, MD;  Location: [facility] OR;  Service: Orthopedics;  Laterality: Left;  . TOTAL KNEE ARTHROPLASTY    . TOTAL KNEE ARTHROPLASTY Right 04/29/2016   Procedure: RIGHT TOTAL KNEE ARTHROPLASTY;  Surgeon: Tarry Kos, MD;  Location: [facility] OR;  Service: Orthopedics;  Laterality: Right;  . VULVAR LESION REMOVAL  03/17/2012   Procedure: VULVAR LESION;  Surgeon: Brock Bad, MD;  Location: [facility] ORS;  Service: Gynecology;  Laterality: N/A;  CO2 Laser Vaporization Of Condyloma    Social History   Social History  . Marital status: Legally Separated    Spouse name: N/A  . Number of children: N/A  .  Years of education: N/A   Occupational History  . Not on file.   Social History Main Topics  . Smoking status: Current Every Day Smoker    Packs/day: 1.50    Years: 30.00  . Smokeless tobacco: Not on file  . Alcohol use No  . Drug use: No  . Sexual activity: Not on file   Other Topics Concern  . Not on file   Social History Narrative  . No narrative on file   Family History  Problem Relation Age of Onset  . Hypertension Mother   . Osteoarthritis Mother   . Cancer Mother       VITAL SIGNS BP (!) 101/50   Pulse 82   Temp (!) 96.6 F (35.9 C) (Oral)   Resp 18   Ht 5\' 5"  (1.651 m)   Wt 192 lb (87.1 kg)   SpO2 97%   BMI 31.95 kg/m   Patient's Medications  New Prescriptions   No medications on file  Previous Medications   ACETAMINOPHEN (TYLENOL) 500 MG TABLET    Take 500 mg by mouth 2 (two) times daily as needed for mild pain.   DICLOFENAC SODIUM (VOLTAREN) 1 % GEL    Apply 2 g topically 4 (four) times daily.   ENOXAPARIN (LOVENOX) 40 MG/0.4ML INJECTION    Inject  0.4 mLs (40 mg total) into the skin daily.   ESCITALOPRAM (LEXAPRO) 10 MG TABLET    Take 10 mg by mouth daily.   LOSARTAN-HYDROCHLOROTHIAZIDE (HYZAAR) 100-12.5 MG TABLET    Take 1 tablet by mouth daily.   MELOXICAM (MOBIC) 7.5 MG TABLET    Take 7.5 mg by mouth daily.   METHOCARBAMOL (ROBAXIN) 750 MG TABLET    Take 1 tablet (750 mg total) by mouth 2 (two) times daily as needed for muscle spasms.   MONTELUKAST (SINGULAIR) 10 MG TABLET    Take 10 mg by mouth daily.    MYRBETRIQ 25 MG TB24 TABLET    Take 25 mg by mouth daily.   NICOTINE (NICODERM CQ - DOSED IN MG/24 HOURS) 14 MG/24HR PATCH    Place 1 patch (14 mg total) onto the skin daily.   ONDANSETRON (ZOFRAN) 4 MG TABLET    Take 1-2 tablets (4-8 mg total) by mouth every 8 (eight) hours as needed for nausea or vomiting.   OXYCODONE (OXY IR/ROXICODONE) 5 MG IMMEDIATE RELEASE TABLET    Take 1-3 tablets (5-15 mg total) by mouth every 4 (four) hours as needed.    OXYCODONE (OXYCONTIN) 10 MG 12 HR TABLET    Take 1 tablet (10 mg total) by mouth every 12 (twelve) hours.   POLYETHYLENE GLYCOL (MIRALAX / GLYCOLAX) PACKET    Take 17 g by mouth daily as needed for mild constipation.   SENNA-DOCUSATE (SENOKOT S) 8.6-50 MG TABLET    Take 1 tablet by mouth at bedtime as needed.  Modified Medications   No medications on file  Discontinued Medications   CELECOXIB (CELEBREX) 200 MG CAPSULE    Take 200 mg by mouth daily.   MISC. DEVICES (WHEEL CHAIR K1 BASIC DESK ARM) MISC    1 Device by Does not apply route as needed.   VALSARTAN-HYDROCHLOROTHIAZIDE (DIOVAN-HCT) 160-12.5 MG PER TABLET    Take 1 tablet by mouth daily.     SIGNIFICANT DIAGNOSTIC EXAMS  04-29-16: right knee x-ray: Status post knee arthroplasty.  No adverse features   04-29-16: left ankle x-ray; Intraoperative images demonstrating ORIF of the ankle. Intraoperative localization.  LABS REVIEWED:   04-22-16: wbc 8.0; hgb 11.8; hct 37.0; mcv 91.1; plt 410; glucose 91; bun 10; creat 0.87; k+ 4.3; na++ 142; liver normal albumin 3.6; sed rate 76; CRP 3.0 04-29-16: wbc 16.3; hgb 10.1; hct 32.0; mcv 90.7 plt 317 04-30-16: wbc 10.6; hgb 9.5; hct 29.4; mcv 89.8. ;plt 310 05-02-16: wbc 10.5; hgb 9.2; hct 28.9; mcv 89.5; plt 297; glucose 123; bun 8; creat 0.94; k+ 3.9; na++ 140      Review of Systems  Constitutional: Negative for malaise/fatigue.  Respiratory: Negative for cough and shortness of breath.   Cardiovascular: Negative for chest pain, palpitations and leg swelling.  Gastrointestinal: Negative for abdominal pain, constipation and heartburn.  Musculoskeletal: Negative for back pain, joint pain and myalgias.  Skin: Negative.   Neurological: Negative for dizziness.  Psychiatric/Behavioral: The patient is not nervous/anxious.      Physical Exam  Vitals reviewed. Constitutional: She is oriented to person, place, and time. She appears well-developed and well-nourished. No distress.  Obese   Eyes:  Conjunctivae are normal.  Neck: Neck supple. No JVD present. No thyromegaly present.  Cardiovascular: Normal rate, regular rhythm and intact distal pulses.   Respiratory: Effort normal and breath sounds normal. No respiratory distress. She has no wheezes.  GI: Soft. Bowel sounds are normal. She exhibits no distension. There is no tenderness.  Musculoskeletal: She exhibits no edema.  Able to move all extremities  Has left side weakness  Lymphadenopathy:    She has no cervical adenopathy.  Neurological: She is alert and oriented to person, place, and time.  Skin: Skin is warm and dry. She is not diaphoretic.  Psychiatric: She has a normal mood and affect.     ASSESSMENT/ PLAN:  1. Osteoarthritis: is status post right knee replacement; will stop the lovenox and will begin asa 325 mg daily for 4 weeks; will continue volatern gel 2 gm four times daily will continue mobic 7.5 mg daily will monitor  2. Left ankle fracture: status hardware removal; will continue mobic 7.5 mg daily robaxin 750 mg twice daily as needed will continue therapy as directed and will follow up with orthopedics as indicated  3. Chronic pain: will continue oxycontin 10 mg every 12 hours; will continue oxycodone 5-15 mg every 4 hours as needed for pain; is using voltaer gel 2 gm four times daily; mobic 7.5 mg daily and has robaxin 750 mg twice daily as needed  4. Hypertension: will continue hyzaar 100/12.5 mg daily   5. Depression with anxiety: will continue lexapro 10 mg daily   6. Allergic rhinitis: will continue singulair 10 mg daily   7. Urinary urge incontinence: will continue myrbetriq 25 mg daily   8. Smoker is on nicotine patch 14 mg daily will monitor   9. Constipation; will continue miralax daily as needed and senna s nightly as needed  10. CVA: with left side weakness: is presently stable will continue to monitor her status.    will check cbc; cmp    Time spent with patient  50  minutes >50% time  spent counseling; reviewing medical record; tests; labs; and developing future plan of care    Synthia Innocent NP [facility]  Contact [phone] Monday through Friday 8am- 5pm  After hours call [phone]

## 2016-06-02 DIAGNOSIS — E119 Type 2 diabetes mellitus without complications: Secondary | ICD-10-CM | POA: Diagnosis not present

## 2016-06-02 DIAGNOSIS — I69354 Hemiplegia and hemiparesis following cerebral infarction affecting left non-dominant side: Secondary | ICD-10-CM | POA: Diagnosis not present

## 2016-06-02 DIAGNOSIS — M62442 Contracture of muscle, left hand: Secondary | ICD-10-CM | POA: Diagnosis not present

## 2016-06-03 DIAGNOSIS — E119 Type 2 diabetes mellitus without complications: Secondary | ICD-10-CM | POA: Diagnosis not present

## 2016-06-03 DIAGNOSIS — M62442 Contracture of muscle, left hand: Secondary | ICD-10-CM | POA: Diagnosis not present

## 2016-06-03 DIAGNOSIS — I69354 Hemiplegia and hemiparesis following cerebral infarction affecting left non-dominant side: Secondary | ICD-10-CM | POA: Diagnosis not present

## 2016-06-04 DIAGNOSIS — E119 Type 2 diabetes mellitus without complications: Secondary | ICD-10-CM | POA: Diagnosis not present

## 2016-06-04 DIAGNOSIS — I69354 Hemiplegia and hemiparesis following cerebral infarction affecting left non-dominant side: Secondary | ICD-10-CM | POA: Diagnosis not present

## 2016-06-04 DIAGNOSIS — M62442 Contracture of muscle, left hand: Secondary | ICD-10-CM | POA: Diagnosis not present

## 2016-06-04 DIAGNOSIS — I639 Cerebral infarction, unspecified: Secondary | ICD-10-CM | POA: Diagnosis not present

## 2016-06-04 DIAGNOSIS — M6281 Muscle weakness (generalized): Secondary | ICD-10-CM | POA: Diagnosis not present

## 2016-06-04 DIAGNOSIS — S82843S Displaced bimalleolar fracture of unspecified lower leg, sequela: Secondary | ICD-10-CM | POA: Diagnosis not present

## 2016-06-04 DIAGNOSIS — M1990 Unspecified osteoarthritis, unspecified site: Secondary | ICD-10-CM | POA: Diagnosis not present

## 2016-06-05 DIAGNOSIS — E119 Type 2 diabetes mellitus without complications: Secondary | ICD-10-CM | POA: Diagnosis not present

## 2016-06-05 DIAGNOSIS — I69354 Hemiplegia and hemiparesis following cerebral infarction affecting left non-dominant side: Secondary | ICD-10-CM | POA: Diagnosis not present

## 2016-06-05 DIAGNOSIS — M62442 Contracture of muscle, left hand: Secondary | ICD-10-CM | POA: Diagnosis not present

## 2016-06-07 DIAGNOSIS — I69354 Hemiplegia and hemiparesis following cerebral infarction affecting left non-dominant side: Secondary | ICD-10-CM | POA: Diagnosis not present

## 2016-06-07 DIAGNOSIS — M62442 Contracture of muscle, left hand: Secondary | ICD-10-CM | POA: Diagnosis not present

## 2016-06-07 DIAGNOSIS — E119 Type 2 diabetes mellitus without complications: Secondary | ICD-10-CM | POA: Diagnosis not present

## 2016-06-08 DIAGNOSIS — M62442 Contracture of muscle, left hand: Secondary | ICD-10-CM | POA: Diagnosis not present

## 2016-06-08 DIAGNOSIS — E119 Type 2 diabetes mellitus without complications: Secondary | ICD-10-CM | POA: Diagnosis not present

## 2016-06-08 DIAGNOSIS — I69354 Hemiplegia and hemiparesis following cerebral infarction affecting left non-dominant side: Secondary | ICD-10-CM | POA: Diagnosis not present

## 2016-06-09 DIAGNOSIS — M62442 Contracture of muscle, left hand: Secondary | ICD-10-CM | POA: Diagnosis not present

## 2016-06-09 DIAGNOSIS — I69354 Hemiplegia and hemiparesis following cerebral infarction affecting left non-dominant side: Secondary | ICD-10-CM | POA: Diagnosis not present

## 2016-06-09 DIAGNOSIS — E119 Type 2 diabetes mellitus without complications: Secondary | ICD-10-CM | POA: Diagnosis not present

## 2016-06-10 DIAGNOSIS — I69354 Hemiplegia and hemiparesis following cerebral infarction affecting left non-dominant side: Secondary | ICD-10-CM | POA: Diagnosis not present

## 2016-06-10 DIAGNOSIS — M62442 Contracture of muscle, left hand: Secondary | ICD-10-CM | POA: Diagnosis not present

## 2016-06-10 DIAGNOSIS — E119 Type 2 diabetes mellitus without complications: Secondary | ICD-10-CM | POA: Diagnosis not present

## 2016-06-11 DIAGNOSIS — E119 Type 2 diabetes mellitus without complications: Secondary | ICD-10-CM | POA: Diagnosis not present

## 2016-06-11 DIAGNOSIS — M62442 Contracture of muscle, left hand: Secondary | ICD-10-CM | POA: Diagnosis not present

## 2016-06-11 DIAGNOSIS — I69354 Hemiplegia and hemiparesis following cerebral infarction affecting left non-dominant side: Secondary | ICD-10-CM | POA: Diagnosis not present

## 2016-06-14 DIAGNOSIS — I69354 Hemiplegia and hemiparesis following cerebral infarction affecting left non-dominant side: Secondary | ICD-10-CM | POA: Diagnosis not present

## 2016-06-14 DIAGNOSIS — M62442 Contracture of muscle, left hand: Secondary | ICD-10-CM | POA: Diagnosis not present

## 2016-06-14 DIAGNOSIS — E119 Type 2 diabetes mellitus without complications: Secondary | ICD-10-CM | POA: Diagnosis not present

## 2016-06-15 ENCOUNTER — Non-Acute Institutional Stay (SKILLED_NURSING_FACILITY): Payer: Medicare Other | Admitting: Internal Medicine

## 2016-06-15 ENCOUNTER — Encounter: Payer: Self-pay | Admitting: Internal Medicine

## 2016-06-15 DIAGNOSIS — Z96651 Presence of right artificial knee joint: Secondary | ICD-10-CM | POA: Diagnosis not present

## 2016-06-15 DIAGNOSIS — I69354 Hemiplegia and hemiparesis following cerebral infarction affecting left non-dominant side: Secondary | ICD-10-CM | POA: Diagnosis not present

## 2016-06-15 DIAGNOSIS — I10 Essential (primary) hypertension: Secondary | ICD-10-CM | POA: Diagnosis not present

## 2016-06-15 DIAGNOSIS — I6930 Unspecified sequelae of cerebral infarction: Secondary | ICD-10-CM | POA: Diagnosis not present

## 2016-06-15 DIAGNOSIS — E119 Type 2 diabetes mellitus without complications: Secondary | ICD-10-CM | POA: Diagnosis not present

## 2016-06-15 DIAGNOSIS — G894 Chronic pain syndrome: Secondary | ICD-10-CM | POA: Diagnosis not present

## 2016-06-15 DIAGNOSIS — M62442 Contracture of muscle, left hand: Secondary | ICD-10-CM | POA: Diagnosis not present

## 2016-06-15 DIAGNOSIS — Z9889 Other specified postprocedural states: Secondary | ICD-10-CM

## 2016-06-15 DIAGNOSIS — Z8679 Personal history of other diseases of the circulatory system: Secondary | ICD-10-CM

## 2016-06-15 DIAGNOSIS — F418 Other specified anxiety disorders: Secondary | ICD-10-CM | POA: Diagnosis not present

## 2016-06-15 DIAGNOSIS — J309 Allergic rhinitis, unspecified: Secondary | ICD-10-CM | POA: Diagnosis not present

## 2016-06-15 NOTE — Progress Notes (Signed)
Patient ID: Jamie Haas, female   DOB: 08-21-1946, 70 y.o.   MRN: [id number]     HISTORY AND PHYSICAL   DATE: 06/15/16  Location:    [place] Room Number: 128 B Place of Service: SNF (31)   Extended Emergency Contact Information Primary Emergency Contact: Noble,Tabitha Address: [address]          [place], [place] [address] [place] [employer] Phone: [phone] Relation: Daughter Secondary Emergency Contact: Noble,Christopher  [place] Mobile Phone: [phone] Relation: Son  Advanced Directive information Does patient have an advance directive?: No, Would patient like information on creating an advanced directive?: No - patient declined information  Chief Complaint  Patient presents with  . New Admit To SNF    HPI:  70 yo female seen today as a new admission into SNF as a transfer from another SNF s/p right TKR and removal of left ankle hardware on 04/29/16, hx brain aneurysm s/p coil in 2011, depression and anxiety, HTN, hx CVA with left hemiparesis and OA. Ancef IV given perioperatively. lovenox given for DVT prophylaxis. No postop complications. H/H 9.2/28.9 on d/c  She has no concerns today. No nursing issues. No falls. Appetite ok. Tolerating tx. Her BIMS score was 14/15 at [facility] in July 2017.  Osteoarthritis - s/p right knee replacement. No longer on lovenox but is taking ASA 325 mg daily for 4 weeks; voltaren gel 2 gm four times daily; mobic 7.5 mg daily   Chronic pain - controlled on  oxycontin 10 mg every 12 hours; oxycodone 5-15 mg every 4 hours as needed for pain; voltaren gel 2 gm four times daily; mobic 7.5 mg daily; robaxin 750 mg twice daily as needed  Hypertension - stable on hyzaar 100/12.5 mg daily   Depression with anxiety - stable on lexapro 10 mg daily   allergic rhinitis - stable on singulair 10 mg daily   Urinary urge incontinence - stable on myrbetriq 25 mg daily   Chronic tobacco  abuse - on nicotine patch 14 mg daily  Constipation - stable on miralax daily as needed and senna s nightly as needed  Hx CVA with left side weakness - stable on ASA   Past Medical History:  Diagnosis Date  . Aneurysm ([place])    left side of brain. Coiled back in 2011  . Arthritis   . Depression   . Hypertension   . Stroke [facility]) 2011   left sided weakness    Past Surgical History:  Procedure Laterality Date  . BRAIN SURGERY  2011   coils inserted  . HARDWARE REMOVAL Left 04/29/2016   Procedure: HARDWARE REMOVAL LEFT ANKLE;  Surgeon: Leandrew Koyanagi, MD;  Location: [place];  Service: Orthopedics;  Laterality: Left;  . ORIF ANKLE FRACTURE Left 12/24/2015   Procedure: OPEN REDUCTION INTERNAL FIXATION (ORIF) ANKLE FRACTURE;  Surgeon: Leandrew Koyanagi, MD;  Location: [place];  Service: Orthopedics;  Laterality: Left;  . TOTAL KNEE ARTHROPLASTY    . TOTAL KNEE ARTHROPLASTY Right 04/29/2016   Procedure: RIGHT TOTAL KNEE ARTHROPLASTY;  Surgeon: Leandrew Koyanagi, MD;  Location: [place];  Service: Orthopedics;  Laterality: Right;  . VULVAR LESION REMOVAL  03/17/2012   Procedure: VULVAR LESION;  Surgeon: Shelly Bombard, MD;  Location: [facility];  Service: Gynecology;  Laterality: N/A;  CO2 Laser Vaporization Of Condyloma    Patient Care Team: Lucianne Lei, MD as PCP - General (Family Medicine)  Social History  Social History  . Marital status: Legally Separated    Spouse name: N/A  . Number of children: N/A  . Years of education: N/A   Occupational History  . Not on file.   Social History Main Topics  . Smoking status: Current Every Day Smoker    Packs/day: 1.50    Years: 30.00  . Smokeless tobacco: Never Used  . Alcohol use No  . Drug use: No  . Sexual activity: Not on file   Other Topics Concern  . Not on file   Social History Narrative  . No narrative on file     reports that she has been smoking.  She has a 45.00 pack-year smoking history. She has never used smokeless tobacco. She  reports that she does not drink alcohol or use drugs.  Family History  Problem Relation Age of Onset  . Hypertension Mother   . Osteoarthritis Mother   . Cancer Mother    Family Status  Relation Status  . Mother     Immunization History  Administered Date(s) Administered  . PPD Test 05/27/2016    No Known Allergies  Medications: Patient's Medications  New Prescriptions   No medications on file  Previous Medications   ACETAMINOPHEN (TYLENOL) 500 MG TABLET    Take 500 mg by mouth 2 (two) times daily as needed for mild pain.   ASPIRIN 325 MG TABLET    Take 325 mg by mouth daily.   DICLOFENAC SODIUM (VOLTAREN) 1 % GEL    Apply 2 g topically 4 (four) times daily.   ENOXAPARIN (LOVENOX) 40 MG/0.4ML INJECTION    Inject 0.4 mLs (40 mg total) into the skin daily.   ESCITALOPRAM (LEXAPRO) 10 MG TABLET    Take 10 mg by mouth daily.   LOSARTAN-HYDROCHLOROTHIAZIDE (HYZAAR) 100-12.5 MG TABLET    Take 1 tablet by mouth daily.   MELOXICAM (MOBIC) 7.5 MG TABLET    Take 7.5 mg by mouth daily.   METHOCARBAMOL (ROBAXIN) 750 MG TABLET    Take 1 tablet (750 mg total) by mouth 2 (two) times daily as needed for muscle spasms.   MONTELUKAST (SINGULAIR) 10 MG TABLET    Take 10 mg by mouth daily.    MYRBETRIQ 25 MG TB24 TABLET    Take 25 mg by mouth daily.   NICOTINE (NICODERM CQ - DOSED IN MG/24 HOURS) 14 MG/24HR PATCH    Place 1 patch (14 mg total) onto the skin daily.   ONDANSETRON (ZOFRAN) 4 MG TABLET    Take 4 mg by mouth every 8 (eight) hours as needed for nausea or vomiting.   OXYCODONE (OXY IR/ROXICODONE) 5 MG IMMEDIATE RELEASE TABLET    Take 1-3 tablets (5-15 mg total) by mouth every 4 (four) hours as needed.   OXYCODONE (OXYCONTIN) 10 MG 12 HR TABLET    Take 1 tablet (10 mg total) by mouth every 12 (twelve) hours.   POLYETHYLENE GLYCOL (MIRALAX / GLYCOLAX) PACKET    Take 17 g by mouth daily as needed for mild constipation.   SENNA-DOCUSATE (SENOKOT S) 8.6-50 MG TABLET    Take 1 tablet by mouth  at bedtime as needed.  Modified Medications   No medications on file  Discontinued Medications   ONDANSETRON (ZOFRAN) 4 MG TABLET    Take 1-2 tablets (4-8 mg total) by mouth every 8 (eight) hours as needed for nausea or vomiting.    Review of Systems  Musculoskeletal: Positive for arthralgias and gait problem.  Neurological: Positive for weakness.  All other systems reviewed and are negative.   Vitals:   06/15/16 1033  BP: 120/60  Pulse: 66  Resp: 19  Temp: 98.1 F (36.7 C)  TempSrc: Oral  SpO2: 97%  Weight: 189 lb 9.6 oz (86 kg)  Height: '5\' 5"'$  (1.651 m)   Body mass index is 31.55 kg/m.  Physical Exam  Constitutional: She is oriented to person, place, and time. She appears well-developed and well-nourished.  HENT:  Mouth/Throat: Oropharynx is clear and moist. No oropharyngeal exudate.  MMM  Eyes: Pupils are equal, round, and reactive to light. No scleral icterus.  Neck: Neck supple. Carotid bruit is not present. No tracheal deviation present. No thyromegaly present.  Cardiovascular: Normal rate, regular rhythm and intact distal pulses.  Exam reveals no gallop and no friction rub.   Murmur (1/6 SEM) heard. +1 pitting LE edema b/l. No calf TTP  Pulmonary/Chest: Effort normal and breath sounds normal. No stridor. No respiratory distress. She has no wheezes. She has no rales.  Abdominal: Soft. Bowel sounds are normal. She exhibits no distension and no mass. There is no hepatomegaly. There is no tenderness. There is no rebound and no guarding.  Musculoskeletal: She exhibits edema, tenderness and deformity.  LUE contracture; right knee marked swelling with reduced ROM; left ankle swelling with TTP and reduced ROM  Lymphadenopathy:    She has no cervical adenopathy.  Neurological: She is alert and oriented to person, place, and time.  Left hemiparesis  Skin: Skin is warm and dry. No rash noted.  Psychiatric: She has a normal mood and affect. Her behavior is normal. Thought  content normal.     Labs reviewed: Admission on 04/29/2016, Discharged on 05/03/2016  Component Date Value Ref Range Status  . Color, Urine 04/29/2016 YELLOW  YELLOW Final  . APPearance 04/29/2016 CLOUDY* CLEAR Final  . Specific Gravity, Urine 04/29/2016 1.014  1.005 - 1.030 Final  . pH 04/29/2016 5.5  5.0 - 8.0 Final  . Glucose, UA 04/29/2016 NEGATIVE  NEGATIVE mg/dL Final  . Hgb urine dipstick 04/29/2016 SMALL* NEGATIVE Final  . Bilirubin Urine 04/29/2016 NEGATIVE  NEGATIVE Final  . Ketones, ur 04/29/2016 NEGATIVE  NEGATIVE mg/dL Final  . Protein, ur 04/29/2016 NEGATIVE  NEGATIVE mg/dL Final  . Nitrite 04/29/2016 NEGATIVE  NEGATIVE Final  . Leukocytes, UA 04/29/2016 SMALL* NEGATIVE Final  . Squamous Epithelial / LPF 04/29/2016 6-30* NONE SEEN Final  . WBC, UA 04/29/2016 6-30  0 - 5 WBC/hpf Final  . RBC / HPF 04/29/2016 0-5  0 - 5 RBC/hpf Final  . Bacteria, UA 04/29/2016 MANY* NONE SEEN Final  . Urine-Other 04/29/2016 TRICHOMONAS PRESENT   Final  . WBC 04/30/2016 10.6* 4.0 - 10.5 K/uL Final  . RBC 04/30/2016 3.27* 3.87 - 5.11 MIL/uL Final  . Hemoglobin 04/30/2016 9.5* 12.0 - 15.0 g/dL Final  . HCT 04/30/2016 29.4* 36.0 - 46.0 % Final  . MCV 04/30/2016 89.9  78.0 - 100.0 fL Final  . MCH 04/30/2016 29.1  26.0 - 34.0 pg Final  . MCHC 04/30/2016 32.3  30.0 - 36.0 g/dL Final  . RDW 04/30/2016 13.9  11.5 - 15.5 % Final  . Platelets 04/30/2016 310  150 - 400 K/uL Final  . Sodium 04/30/2016 140  135 - 145 mmol/L Final  . Potassium 04/30/2016 3.9  3.5 - 5.1 mmol/L Final  . Chloride 04/30/2016 107  101 - 111 mmol/L Final  . CO2 04/30/2016 27  22 - 32 mmol/L Final  . Glucose, Bld 04/30/2016 123* 65 -  99 mg/dL Final  . BUN 35/51/1613 8  6 - 20 mg/dL Final  . Creatinine, Ser 04/30/2016 0.94  0.44 - 1.00 mg/dL Final  . Calcium 27/77/2472 8.6* 8.9 - 10.3 mg/dL Final  . GFR calc non Af Amer 04/30/2016 >60  >60 mL/min Final  . GFR calc Af Amer 04/30/2016 >60  >60 mL/min Final   Comment:  (NOTE) The eGFR has been calculated using the CKD EPI equation. This calculation has not been validated in all clinical situations. eGFR's persistently <60 mL/min signify possible Chronic Kidney Disease.   . Anion gap 04/30/2016 6  5 - 15 Final  . WBC 04/29/2016 16.3* 4.0 - 10.5 K/uL Final  . RBC 04/29/2016 3.53* 3.87 - 5.11 MIL/uL Final  . Hemoglobin 04/29/2016 10.1* 12.0 - 15.0 g/dL Final  . HCT 14/75/3389 32.0* 36.0 - 46.0 % Final  . MCV 04/29/2016 90.7  78.0 - 100.0 fL Final  . MCH 04/29/2016 28.6  26.0 - 34.0 pg Final  . MCHC 04/29/2016 31.6  30.0 - 36.0 g/dL Final  . RDW 49/55/0650 14.0  11.5 - 15.5 % Final  . Platelets 04/29/2016 312  150 - 400 K/uL Final  . Creatinine, Ser 04/29/2016 0.76  0.44 - 1.00 mg/dL Final  . GFR calc non Af Amer 04/29/2016 >60  >60 mL/min Final  . GFR calc Af Amer 04/29/2016 >60  >60 mL/min Final   Comment: (NOTE) The eGFR has been calculated using the CKD EPI equation. This calculation has not been validated in all clinical situations. eGFR's persistently <60 mL/min signify possible Chronic Kidney Disease.   . WBC 05/02/2016 10.5  4.0 - 10.5 K/uL Final  . RBC 05/02/2016 3.23* 3.87 - 5.11 MIL/uL Final  . Hemoglobin 05/02/2016 9.2* 12.0 - 15.0 g/dL Final  . HCT 65/29/0950 28.9* 36.0 - 46.0 % Final  . MCV 05/02/2016 89.5  78.0 - 100.0 fL Final  . MCH 05/02/2016 28.5  26.0 - 34.0 pg Final  . MCHC 05/02/2016 31.8  30.0 - 36.0 g/dL Final  . RDW 47/39/4261 14.2  11.5 - 15.5 % Final  . Platelets 05/02/2016 297  150 - 400 K/uL Final  Hospital Outpatient Visit on 04/22/2016  Component Date Value Ref Range Status  . aPTT 04/22/2016 40* 24 - 37 seconds Final   Comment:        IF BASELINE aPTT IS ELEVATED, SUGGEST PATIENT RISK ASSESSMENT BE USED TO DETERMINE APPROPRIATE ANTICOAGULANT THERAPY.   . WBC 04/22/2016 8.0  4.0 - 10.5 K/uL Final  . RBC 04/22/2016 4.06  3.87 - 5.11 MIL/uL Final  . Hemoglobin 04/22/2016 11.8* 12.0 - 15.0 g/dL Final  . HCT  01/15/6582 37.0  36.0 - 46.0 % Final  . MCV 04/22/2016 91.1  78.0 - 100.0 fL Final  . MCH 04/22/2016 29.1  26.0 - 34.0 pg Final  . MCHC 04/22/2016 31.9  30.0 - 36.0 g/dL Final  . RDW 95/51/0335 14.1  11.5 - 15.5 % Final  . Platelets 04/22/2016 410* 150 - 400 K/uL Final  . Neutrophils Relative % 04/22/2016 60  % Final  . Neutro Abs 04/22/2016 4.9  1.7 - 7.7 K/uL Final  . Lymphocytes Relative 04/22/2016 26  % Final  . Lymphs Abs 04/22/2016 2.1  0.7 - 4.0 K/uL Final  . Monocytes Relative 04/22/2016 8  % Final  . Monocytes Absolute 04/22/2016 0.6  0.1 - 1.0 K/uL Final  . Eosinophils Relative 04/22/2016 6  % Final  . Eosinophils Absolute 04/22/2016 0.5  0.0 - 0.7  K/uL Final  . Basophils Relative 04/22/2016 0  % Final  . Basophils Absolute 04/22/2016 0.0  0.0 - 0.1 K/uL Final  . Sodium 04/22/2016 142  135 - 145 mmol/L Final  . Potassium 04/22/2016 4.3  3.5 - 5.1 mmol/L Final  . Chloride 04/22/2016 106  101 - 111 mmol/L Final  . CO2 04/22/2016 28  22 - 32 mmol/L Final  . Glucose, Bld 04/22/2016 91  65 - 99 mg/dL Final  . BUN 04/22/2016 10  6 - 20 mg/dL Final  . Creatinine, Ser 04/22/2016 0.87  0.44 - 1.00 mg/dL Final  . Calcium 04/22/2016 10.1  8.9 - 10.3 mg/dL Final  . Total Protein 04/22/2016 8.0  6.5 - 8.1 g/dL Final  . Albumin 04/22/2016 3.6  3.5 - 5.0 g/dL Final  . AST 04/22/2016 14* 15 - 41 U/L Final  . ALT 04/22/2016 11* 14 - 54 U/L Final  . Alkaline Phosphatase 04/22/2016 91  38 - 126 U/L Final  . Total Bilirubin 04/22/2016 0.5  0.3 - 1.2 mg/dL Final  . GFR calc non Af Amer 04/22/2016 >60  >60 mL/min Final  . GFR calc Af Amer 04/22/2016 >60  >60 mL/min Final   Comment: (NOTE) The eGFR has been calculated using the CKD EPI equation. This calculation has not been validated in all clinical situations. eGFR's persistently <60 mL/min signify possible Chronic Kidney Disease.   . Anion gap 04/22/2016 8  5 - 15 Final  . Prothrombin Time 04/22/2016 15.0  11.6 - 15.2 seconds Final  .  INR 04/22/2016 1.16  0.00 - 1.49 Final  . ABO/RH(D) 04/22/2016 O POS   Final  . Antibody Screen 04/22/2016 NEG   Final  . Sample Expiration 04/22/2016 05/06/2016   Final  . Extend sample reason 04/22/2016 NO TRANSFUSIONS OR PREGNANCY IN THE PAST 3 MONTHS   Final  . CRP 04/22/2016 3.0* <1.0 mg/dL Final  . Sed Rate 04/22/2016 76* 0 - 22 mm/hr Final  . MRSA, PCR 04/22/2016 NEGATIVE  NEGATIVE Final  . Staphylococcus aureus 04/22/2016 NEGATIVE  NEGATIVE Final   Comment:        The Xpert SA Assay (FDA approved for NASAL specimens in patients over 77 years of age), is one component of a comprehensive surveillance program.  Test performance has been validated by [facility] for patients greater than or equal to 45 year old. It is not intended to diagnose infection nor to guide or monitor treatment.     No results found.   Assessment/Plan   ICD-9-CM ICD-10-CM   1. Status post total right knee replacement V43.65 Z96.651   2. History of stroke with residual deficit 438.9 I69.30    left hemiparesis  3. Depression with anxiety 300.4 F41.8   4. Essential hypertension 401.9 I10   5. Chronic pain syndrome 338.4 G89.4   6. S/P hardware removal V45.89 Z98.890    left ankle  7. Allergic rhinitis, unspecified allergic rhinitis type 477.9 J30.9   8. History of intracranial aneurysm V12.54 Z86.79    s/p coiling    Check CBC and CMP  Cont current meds as ordered  F/u with Ortho as scheduled  PT/OT/ST as ordered  Nutritional supplements as indicated  GOAL: short term rehab and d/c home when medically appropriate. Communicated with pt and nursing.  Will follow  Shean Gerding S. Perlie Gold  [facility] [address] [place], [place] [address] [phone] Cell (Monday-Friday 8 AM - 5  PM) [phone] After 5 PM and follow prompts

## 2016-06-16 ENCOUNTER — Other Ambulatory Visit: Payer: Medicare Other

## 2016-06-16 ENCOUNTER — Ambulatory Visit
Admission: RE | Admit: 2016-06-16 | Discharge: 2016-06-16 | Disposition: A | Discharge status: Home / Self Care | Payer: Medicare Other | Source: Ambulatory Visit | Attending: Internal Medicine | Admitting: Internal Medicine

## 2016-06-16 DIAGNOSIS — M62442 Contracture of muscle, left hand: Secondary | ICD-10-CM | POA: Diagnosis not present

## 2016-06-16 DIAGNOSIS — I69354 Hemiplegia and hemiparesis following cerebral infarction affecting left non-dominant side: Secondary | ICD-10-CM | POA: Diagnosis not present

## 2016-06-16 DIAGNOSIS — E119 Type 2 diabetes mellitus without complications: Secondary | ICD-10-CM | POA: Diagnosis not present

## 2016-06-16 DIAGNOSIS — Z1231 Encounter for screening mammogram for malignant neoplasm of breast: Secondary | ICD-10-CM | POA: Diagnosis not present

## 2016-06-16 DIAGNOSIS — I10 Essential (primary) hypertension: Secondary | ICD-10-CM | POA: Diagnosis not present

## 2016-06-17 DIAGNOSIS — I69354 Hemiplegia and hemiparesis following cerebral infarction affecting left non-dominant side: Secondary | ICD-10-CM | POA: Diagnosis not present

## 2016-06-17 DIAGNOSIS — M62442 Contracture of muscle, left hand: Secondary | ICD-10-CM | POA: Diagnosis not present

## 2016-06-17 DIAGNOSIS — E119 Type 2 diabetes mellitus without complications: Secondary | ICD-10-CM | POA: Diagnosis not present

## 2016-06-18 DIAGNOSIS — M62442 Contracture of muscle, left hand: Secondary | ICD-10-CM | POA: Diagnosis not present

## 2016-06-18 DIAGNOSIS — I69354 Hemiplegia and hemiparesis following cerebral infarction affecting left non-dominant side: Secondary | ICD-10-CM | POA: Diagnosis not present

## 2016-06-18 DIAGNOSIS — E119 Type 2 diabetes mellitus without complications: Secondary | ICD-10-CM | POA: Diagnosis not present

## 2016-06-19 DIAGNOSIS — I69354 Hemiplegia and hemiparesis following cerebral infarction affecting left non-dominant side: Secondary | ICD-10-CM | POA: Diagnosis not present

## 2016-06-19 DIAGNOSIS — E119 Type 2 diabetes mellitus without complications: Secondary | ICD-10-CM | POA: Diagnosis not present

## 2016-06-19 DIAGNOSIS — M62442 Contracture of muscle, left hand: Secondary | ICD-10-CM | POA: Diagnosis not present

## 2016-06-21 DIAGNOSIS — M62442 Contracture of muscle, left hand: Secondary | ICD-10-CM | POA: Diagnosis not present

## 2016-06-21 DIAGNOSIS — E119 Type 2 diabetes mellitus without complications: Secondary | ICD-10-CM | POA: Diagnosis not present

## 2016-06-21 DIAGNOSIS — I69354 Hemiplegia and hemiparesis following cerebral infarction affecting left non-dominant side: Secondary | ICD-10-CM | POA: Diagnosis not present

## 2016-06-22 DIAGNOSIS — M62442 Contracture of muscle, left hand: Secondary | ICD-10-CM | POA: Diagnosis not present

## 2016-06-22 DIAGNOSIS — E119 Type 2 diabetes mellitus without complications: Secondary | ICD-10-CM | POA: Diagnosis not present

## 2016-06-22 DIAGNOSIS — I69354 Hemiplegia and hemiparesis following cerebral infarction affecting left non-dominant side: Secondary | ICD-10-CM | POA: Diagnosis not present

## 2016-06-23 DIAGNOSIS — M62442 Contracture of muscle, left hand: Secondary | ICD-10-CM | POA: Diagnosis not present

## 2016-06-23 DIAGNOSIS — E119 Type 2 diabetes mellitus without complications: Secondary | ICD-10-CM | POA: Diagnosis not present

## 2016-06-23 DIAGNOSIS — I69354 Hemiplegia and hemiparesis following cerebral infarction affecting left non-dominant side: Secondary | ICD-10-CM | POA: Diagnosis not present

## 2016-06-24 DIAGNOSIS — I69354 Hemiplegia and hemiparesis following cerebral infarction affecting left non-dominant side: Secondary | ICD-10-CM | POA: Diagnosis not present

## 2016-06-24 DIAGNOSIS — M62442 Contracture of muscle, left hand: Secondary | ICD-10-CM | POA: Diagnosis not present

## 2016-06-24 DIAGNOSIS — E119 Type 2 diabetes mellitus without complications: Secondary | ICD-10-CM | POA: Diagnosis not present

## 2016-06-25 DIAGNOSIS — E119 Type 2 diabetes mellitus without complications: Secondary | ICD-10-CM | POA: Diagnosis not present

## 2016-06-25 DIAGNOSIS — M62442 Contracture of muscle, left hand: Secondary | ICD-10-CM | POA: Diagnosis not present

## 2016-06-25 DIAGNOSIS — I69354 Hemiplegia and hemiparesis following cerebral infarction affecting left non-dominant side: Secondary | ICD-10-CM | POA: Diagnosis not present

## 2016-06-27 DIAGNOSIS — I69354 Hemiplegia and hemiparesis following cerebral infarction affecting left non-dominant side: Secondary | ICD-10-CM | POA: Diagnosis not present

## 2016-06-27 DIAGNOSIS — M62442 Contracture of muscle, left hand: Secondary | ICD-10-CM | POA: Diagnosis not present

## 2016-06-27 DIAGNOSIS — E119 Type 2 diabetes mellitus without complications: Secondary | ICD-10-CM | POA: Diagnosis not present

## 2016-06-28 DIAGNOSIS — E119 Type 2 diabetes mellitus without complications: Secondary | ICD-10-CM | POA: Diagnosis not present

## 2016-06-28 DIAGNOSIS — I69354 Hemiplegia and hemiparesis following cerebral infarction affecting left non-dominant side: Secondary | ICD-10-CM | POA: Diagnosis not present

## 2016-06-28 DIAGNOSIS — M62442 Contracture of muscle, left hand: Secondary | ICD-10-CM | POA: Diagnosis not present

## 2016-06-29 DIAGNOSIS — I69354 Hemiplegia and hemiparesis following cerebral infarction affecting left non-dominant side: Secondary | ICD-10-CM | POA: Diagnosis not present

## 2016-06-29 DIAGNOSIS — E119 Type 2 diabetes mellitus without complications: Secondary | ICD-10-CM | POA: Diagnosis not present

## 2016-06-29 DIAGNOSIS — M62442 Contracture of muscle, left hand: Secondary | ICD-10-CM | POA: Diagnosis not present

## 2016-06-30 DIAGNOSIS — E119 Type 2 diabetes mellitus without complications: Secondary | ICD-10-CM | POA: Diagnosis not present

## 2016-06-30 DIAGNOSIS — M62442 Contracture of muscle, left hand: Secondary | ICD-10-CM | POA: Diagnosis not present

## 2016-06-30 DIAGNOSIS — I69354 Hemiplegia and hemiparesis following cerebral infarction affecting left non-dominant side: Secondary | ICD-10-CM | POA: Diagnosis not present

## 2016-07-01 DIAGNOSIS — I69354 Hemiplegia and hemiparesis following cerebral infarction affecting left non-dominant side: Secondary | ICD-10-CM | POA: Diagnosis not present

## 2016-07-01 DIAGNOSIS — E119 Type 2 diabetes mellitus without complications: Secondary | ICD-10-CM | POA: Diagnosis not present

## 2016-07-01 DIAGNOSIS — M62442 Contracture of muscle, left hand: Secondary | ICD-10-CM | POA: Diagnosis not present

## 2016-07-02 DIAGNOSIS — I69354 Hemiplegia and hemiparesis following cerebral infarction affecting left non-dominant side: Secondary | ICD-10-CM | POA: Diagnosis not present

## 2016-07-02 DIAGNOSIS — E119 Type 2 diabetes mellitus without complications: Secondary | ICD-10-CM | POA: Diagnosis not present

## 2016-07-02 DIAGNOSIS — M62442 Contracture of muscle, left hand: Secondary | ICD-10-CM | POA: Diagnosis not present

## 2016-07-05 DIAGNOSIS — S82843S Displaced bimalleolar fracture of unspecified lower leg, sequela: Secondary | ICD-10-CM | POA: Diagnosis not present

## 2016-07-05 DIAGNOSIS — M62442 Contracture of muscle, left hand: Secondary | ICD-10-CM | POA: Diagnosis not present

## 2016-07-05 DIAGNOSIS — I69354 Hemiplegia and hemiparesis following cerebral infarction affecting left non-dominant side: Secondary | ICD-10-CM | POA: Diagnosis not present

## 2016-07-05 DIAGNOSIS — I639 Cerebral infarction, unspecified: Secondary | ICD-10-CM | POA: Diagnosis not present

## 2016-07-05 DIAGNOSIS — E119 Type 2 diabetes mellitus without complications: Secondary | ICD-10-CM | POA: Diagnosis not present

## 2016-07-05 DIAGNOSIS — M1990 Unspecified osteoarthritis, unspecified site: Secondary | ICD-10-CM | POA: Diagnosis not present

## 2016-07-05 DIAGNOSIS — M6281 Muscle weakness (generalized): Secondary | ICD-10-CM | POA: Diagnosis not present

## 2016-07-06 DIAGNOSIS — E119 Type 2 diabetes mellitus without complications: Secondary | ICD-10-CM | POA: Diagnosis not present

## 2016-07-06 DIAGNOSIS — I69354 Hemiplegia and hemiparesis following cerebral infarction affecting left non-dominant side: Secondary | ICD-10-CM | POA: Diagnosis not present

## 2016-07-06 DIAGNOSIS — M62442 Contracture of muscle, left hand: Secondary | ICD-10-CM | POA: Diagnosis not present

## 2016-07-07 DIAGNOSIS — E119 Type 2 diabetes mellitus without complications: Secondary | ICD-10-CM | POA: Diagnosis not present

## 2016-07-07 DIAGNOSIS — M62442 Contracture of muscle, left hand: Secondary | ICD-10-CM | POA: Diagnosis not present

## 2016-07-07 DIAGNOSIS — I69354 Hemiplegia and hemiparesis following cerebral infarction affecting left non-dominant side: Secondary | ICD-10-CM | POA: Diagnosis not present

## 2016-07-08 DIAGNOSIS — E119 Type 2 diabetes mellitus without complications: Secondary | ICD-10-CM | POA: Diagnosis not present

## 2016-07-08 DIAGNOSIS — I69354 Hemiplegia and hemiparesis following cerebral infarction affecting left non-dominant side: Secondary | ICD-10-CM | POA: Diagnosis not present

## 2016-07-08 DIAGNOSIS — M62442 Contracture of muscle, left hand: Secondary | ICD-10-CM | POA: Diagnosis not present

## 2016-07-09 DIAGNOSIS — I639 Cerebral infarction, unspecified: Secondary | ICD-10-CM | POA: Diagnosis not present

## 2016-07-09 DIAGNOSIS — M62442 Contracture of muscle, left hand: Secondary | ICD-10-CM | POA: Diagnosis not present

## 2016-07-09 DIAGNOSIS — M1990 Unspecified osteoarthritis, unspecified site: Secondary | ICD-10-CM | POA: Diagnosis not present

## 2016-07-09 DIAGNOSIS — I69354 Hemiplegia and hemiparesis following cerebral infarction affecting left non-dominant side: Secondary | ICD-10-CM | POA: Diagnosis not present

## 2016-07-09 DIAGNOSIS — N3281 Overactive bladder: Secondary | ICD-10-CM | POA: Diagnosis not present

## 2016-07-09 DIAGNOSIS — I10 Essential (primary) hypertension: Secondary | ICD-10-CM | POA: Diagnosis not present

## 2016-07-09 DIAGNOSIS — Z9181 History of falling: Secondary | ICD-10-CM | POA: Diagnosis not present

## 2016-07-09 DIAGNOSIS — E119 Type 2 diabetes mellitus without complications: Secondary | ICD-10-CM | POA: Diagnosis not present

## 2016-07-11 DIAGNOSIS — Z9889 Other specified postprocedural states: Secondary | ICD-10-CM | POA: Insufficient documentation

## 2016-07-11 DIAGNOSIS — Z8679 Personal history of other diseases of the circulatory system: Secondary | ICD-10-CM | POA: Insufficient documentation

## 2016-07-11 DIAGNOSIS — I6930 Unspecified sequelae of cerebral infarction: Secondary | ICD-10-CM | POA: Insufficient documentation

## 2016-07-12 DIAGNOSIS — Z9181 History of falling: Secondary | ICD-10-CM | POA: Diagnosis not present

## 2016-07-12 DIAGNOSIS — I10 Essential (primary) hypertension: Secondary | ICD-10-CM | POA: Diagnosis not present

## 2016-07-12 DIAGNOSIS — M62442 Contracture of muscle, left hand: Secondary | ICD-10-CM | POA: Diagnosis not present

## 2016-07-12 DIAGNOSIS — N3281 Overactive bladder: Secondary | ICD-10-CM | POA: Diagnosis not present

## 2016-07-12 DIAGNOSIS — M1990 Unspecified osteoarthritis, unspecified site: Secondary | ICD-10-CM | POA: Diagnosis not present

## 2016-07-12 DIAGNOSIS — E119 Type 2 diabetes mellitus without complications: Secondary | ICD-10-CM | POA: Diagnosis not present

## 2016-07-12 DIAGNOSIS — I69354 Hemiplegia and hemiparesis following cerebral infarction affecting left non-dominant side: Secondary | ICD-10-CM | POA: Diagnosis not present

## 2016-07-12 DIAGNOSIS — I639 Cerebral infarction, unspecified: Secondary | ICD-10-CM | POA: Diagnosis not present

## 2016-07-13 DIAGNOSIS — I69354 Hemiplegia and hemiparesis following cerebral infarction affecting left non-dominant side: Secondary | ICD-10-CM | POA: Diagnosis not present

## 2016-07-13 DIAGNOSIS — E119 Type 2 diabetes mellitus without complications: Secondary | ICD-10-CM | POA: Diagnosis not present

## 2016-07-13 DIAGNOSIS — I10 Essential (primary) hypertension: Secondary | ICD-10-CM | POA: Diagnosis not present

## 2016-07-13 DIAGNOSIS — I639 Cerebral infarction, unspecified: Secondary | ICD-10-CM | POA: Diagnosis not present

## 2016-07-13 DIAGNOSIS — M1990 Unspecified osteoarthritis, unspecified site: Secondary | ICD-10-CM | POA: Diagnosis not present

## 2016-07-13 DIAGNOSIS — Z9181 History of falling: Secondary | ICD-10-CM | POA: Diagnosis not present

## 2016-07-13 DIAGNOSIS — N3281 Overactive bladder: Secondary | ICD-10-CM | POA: Diagnosis not present

## 2016-07-13 DIAGNOSIS — M62442 Contracture of muscle, left hand: Secondary | ICD-10-CM | POA: Diagnosis not present

## 2016-07-14 DIAGNOSIS — M1990 Unspecified osteoarthritis, unspecified site: Secondary | ICD-10-CM | POA: Diagnosis not present

## 2016-07-14 DIAGNOSIS — M62442 Contracture of muscle, left hand: Secondary | ICD-10-CM | POA: Diagnosis not present

## 2016-07-14 DIAGNOSIS — I10 Essential (primary) hypertension: Secondary | ICD-10-CM | POA: Diagnosis not present

## 2016-07-14 DIAGNOSIS — E119 Type 2 diabetes mellitus without complications: Secondary | ICD-10-CM | POA: Diagnosis not present

## 2016-07-14 DIAGNOSIS — I639 Cerebral infarction, unspecified: Secondary | ICD-10-CM | POA: Diagnosis not present

## 2016-07-14 DIAGNOSIS — Z9181 History of falling: Secondary | ICD-10-CM | POA: Diagnosis not present

## 2016-07-14 DIAGNOSIS — I69354 Hemiplegia and hemiparesis following cerebral infarction affecting left non-dominant side: Secondary | ICD-10-CM | POA: Diagnosis not present

## 2016-07-14 DIAGNOSIS — N3281 Overactive bladder: Secondary | ICD-10-CM | POA: Diagnosis not present

## 2016-07-15 DIAGNOSIS — Z9181 History of falling: Secondary | ICD-10-CM | POA: Diagnosis not present

## 2016-07-15 DIAGNOSIS — I10 Essential (primary) hypertension: Secondary | ICD-10-CM | POA: Diagnosis not present

## 2016-07-15 DIAGNOSIS — N3281 Overactive bladder: Secondary | ICD-10-CM | POA: Diagnosis not present

## 2016-07-15 DIAGNOSIS — E119 Type 2 diabetes mellitus without complications: Secondary | ICD-10-CM | POA: Diagnosis not present

## 2016-07-15 DIAGNOSIS — I639 Cerebral infarction, unspecified: Secondary | ICD-10-CM | POA: Diagnosis not present

## 2016-07-15 DIAGNOSIS — I69354 Hemiplegia and hemiparesis following cerebral infarction affecting left non-dominant side: Secondary | ICD-10-CM | POA: Diagnosis not present

## 2016-07-15 DIAGNOSIS — M62442 Contracture of muscle, left hand: Secondary | ICD-10-CM | POA: Diagnosis not present

## 2016-07-15 DIAGNOSIS — M1990 Unspecified osteoarthritis, unspecified site: Secondary | ICD-10-CM | POA: Diagnosis not present

## 2016-07-16 DIAGNOSIS — I10 Essential (primary) hypertension: Secondary | ICD-10-CM | POA: Diagnosis not present

## 2016-07-16 DIAGNOSIS — I69354 Hemiplegia and hemiparesis following cerebral infarction affecting left non-dominant side: Secondary | ICD-10-CM | POA: Diagnosis not present

## 2016-07-16 DIAGNOSIS — M62442 Contracture of muscle, left hand: Secondary | ICD-10-CM | POA: Diagnosis not present

## 2016-07-16 DIAGNOSIS — E119 Type 2 diabetes mellitus without complications: Secondary | ICD-10-CM | POA: Diagnosis not present

## 2016-07-16 DIAGNOSIS — Z9181 History of falling: Secondary | ICD-10-CM | POA: Diagnosis not present

## 2016-07-16 DIAGNOSIS — I639 Cerebral infarction, unspecified: Secondary | ICD-10-CM | POA: Diagnosis not present

## 2016-07-16 DIAGNOSIS — M1990 Unspecified osteoarthritis, unspecified site: Secondary | ICD-10-CM | POA: Diagnosis not present

## 2016-07-16 DIAGNOSIS — N3281 Overactive bladder: Secondary | ICD-10-CM | POA: Diagnosis not present

## 2016-07-19 ENCOUNTER — Encounter: Payer: Self-pay | Admitting: Adult Health

## 2016-07-19 ENCOUNTER — Non-Acute Institutional Stay (SKILLED_NURSING_FACILITY): Payer: Medicare Other | Admitting: Adult Health

## 2016-07-19 DIAGNOSIS — S82852D Displaced trimalleolar fracture of left lower leg, subsequent encounter for closed fracture with routine healing: Secondary | ICD-10-CM | POA: Diagnosis not present

## 2016-07-19 DIAGNOSIS — M1990 Unspecified osteoarthritis, unspecified site: Secondary | ICD-10-CM | POA: Diagnosis not present

## 2016-07-19 DIAGNOSIS — I69354 Hemiplegia and hemiparesis following cerebral infarction affecting left non-dominant side: Secondary | ICD-10-CM | POA: Diagnosis not present

## 2016-07-19 DIAGNOSIS — N3281 Overactive bladder: Secondary | ICD-10-CM | POA: Diagnosis not present

## 2016-07-19 DIAGNOSIS — G894 Chronic pain syndrome: Secondary | ICD-10-CM | POA: Diagnosis not present

## 2016-07-19 DIAGNOSIS — M62442 Contracture of muscle, left hand: Secondary | ICD-10-CM | POA: Diagnosis not present

## 2016-07-19 DIAGNOSIS — Z9181 History of falling: Secondary | ICD-10-CM | POA: Diagnosis not present

## 2016-07-19 DIAGNOSIS — E119 Type 2 diabetes mellitus without complications: Secondary | ICD-10-CM | POA: Diagnosis not present

## 2016-07-19 DIAGNOSIS — M170 Bilateral primary osteoarthritis of knee: Secondary | ICD-10-CM | POA: Diagnosis not present

## 2016-07-19 DIAGNOSIS — I10 Essential (primary) hypertension: Secondary | ICD-10-CM | POA: Diagnosis not present

## 2016-07-19 DIAGNOSIS — I639 Cerebral infarction, unspecified: Secondary | ICD-10-CM | POA: Diagnosis not present

## 2016-07-19 DIAGNOSIS — F17200 Nicotine dependence, unspecified, uncomplicated: Secondary | ICD-10-CM

## 2016-07-19 DIAGNOSIS — N3941 Urge incontinence: Secondary | ICD-10-CM

## 2016-07-19 DIAGNOSIS — Z720 Tobacco use: Secondary | ICD-10-CM | POA: Diagnosis not present

## 2016-07-19 DIAGNOSIS — F418 Other specified anxiety disorders: Secondary | ICD-10-CM

## 2016-07-19 NOTE — Progress Notes (Signed)
Patient ID: Jamie Haas, female   DOB: January 29, 1946, 70 y.o.   MRN: [id number]    [facility] Room Number: 133-B Place of Service:  SNF (31)   CODE STATUS: Full Code  No Known Allergies  Chief Complaint  Patient presents with  . Medical Management of Chronic Issues    Follow up    HPI:  She is a long term resident of this facility being seen for the management of her chronic illnesses. Overall her status is stable. She is getting out of bed on a daily basis. She is not voicing any complaints at this time. There are no nursing concerns at this time.   Past Medical History:  Diagnosis Date  . 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 Arthritis   . Depression   . Hypertension   . Stroke [facility] 2011   left sided weakness    Past Surgical History:  Procedure Laterality Date  . BRAIN SURGERY  2011   coils inserted  . HARDWARE REMOVAL Left 04/29/2016   Procedure: HARDWARE REMOVAL LEFT ANKLE;  Surgeon: Tarry KosNaiping M Xu, MD;  Location: [facility];  Service: Orthopedics;  Laterality: Left;  . ORIF ANKLE FRACTURE Left 12/24/2015   Procedure: OPEN REDUCTION INTERNAL FIXATION (ORIF) ANKLE FRACTURE;  Surgeon: Tarry KosNaiping M Xu, MD;  Location: [facility];  Service: Orthopedics;  Laterality: Left;  . TOTAL KNEE ARTHROPLASTY    . TOTAL KNEE ARTHROPLASTY Right 04/29/2016   Procedure: RIGHT TOTAL KNEE ARTHROPLASTY;  Surgeon: Tarry KosNaiping M Xu, MD;  Location: [facility];  Service: Orthopedics;  Laterality: Right;  . VULVAR LESION REMOVAL  03/17/2012   Procedure: VULVAR LESION;  Surgeon: Brock Badharles A Harper, MD;  Location: [facility];  Service: Gynecology;  Laterality: N/A;  CO2 Laser Vaporization Of Condyloma    Social History   Social History  . Marital status: Legally Separated    Spouse name: N/A  . Number of children: N/A  . Years of education: N/A   Occupational History  . Not on file.   Social History Main Topics  . Smoking status: Current Every Day Smoker   Packs/day: 1.50    Years: 30.00  . Smokeless tobacco: Never Used  . Alcohol use No  . Drug use: No  . Sexual activity: Not on file   Other Topics Concern  . Not on file   Social History Narrative  . No narrative on file   Family History  Problem Relation Age of Onset  . Hypertension Mother   . Osteoarthritis Mother   . Cancer Mother       VITAL SIGNS BP (!) 144/78   Pulse 88   Temp 97.8 F (36.6 C) (Oral)   Resp 18   Ht 5\' 5"  (1.651 m)   Wt 196 lb (88.9 kg)   SpO2 94%   BMI 32.62 kg/m   Patient's Medications  New Prescriptions   No medications on file  Previous Medications   ACETAMINOPHEN (TYLENOL) 500 MG TABLET    Take 500 mg by mouth 2 (two) times daily as needed for mild pain.   ASPIRIN 325 MG TABLET    Take 325 mg by mouth daily.   DICLOFENAC SODIUM (VOLTAREN) 1 % GEL    Apply 2 g topically 4 (four) times daily.   ESCITALOPRAM (LEXAPRO) 10 MG TABLET    Take 10 mg by mouth daily.   LOSARTAN-HYDROCHLOROTHIAZIDE (HYZAAR) 100-12.5 MG TABLET  Take 1 tablet by mouth daily.   MELOXICAM (MOBIC) 7.5 MG TABLET    Take 7.5 mg by mouth daily.   METHOCARBAMOL (ROBAXIN) 750 MG TABLET    Take 1 tablet (750 mg total) by mouth 2 (two) times daily as needed for muscle spasms.   MONTELUKAST (SINGULAIR) 10 MG TABLET    Take 10 mg by mouth daily.    MYRBETRIQ 25 MG TB24 TABLET    Take 25 mg by mouth daily.   ONDANSETRON (ZOFRAN) 4 MG TABLET    Take 4 mg by mouth every 8 (eight) hours as needed for nausea or vomiting.   OXYCODONE (OXY IR/ROXICODONE) 5 MG IMMEDIATE RELEASE TABLET    Take 1-3 tablets (5-15 mg total) by mouth every 4 (four) hours as needed.   OXYCODONE (OXYCONTIN) 10 MG 12 HR TABLET    Take 1 tablet (10 mg total) by mouth every 12 (twelve) hours.   POLYETHYLENE GLYCOL (MIRALAX / GLYCOLAX) PACKET    Take 17 g by mouth daily as needed for mild constipation.   SENNA-DOCUSATE (SENOKOT S) 8.6-50 MG TABLET    Take 1 tablet by mouth at bedtime as needed.  Modified  Medications   No medications on file  Discontinued Medications   ENOXAPARIN (LOVENOX) 40 MG/0.4ML INJECTION    Inject 0.4 mLs (40 mg total) into the skin daily.   NICOTINE (NICODERM CQ - DOSED IN MG/24 HOURS) 14 MG/24HR PATCH    Place 1 patch (14 mg total) onto the skin daily.     SIGNIFICANT DIAGNOSTIC EXAMS  04-29-16: right knee x-ray: Status post knee arthroplasty.  No adverse features   04-29-16: left ankle x-ray; Intraoperative images demonstrating ORIF of the ankle. Intraoperative localization.  06-16-16: screening mammogram: No mammographic evidence of malignancy.   LABS REVIEWED:   04-22-16: wbc 8.0; hgb 11.8; hct 37.0; mcv 91.1; plt 410; glucose 91; bun 10; creat 0.87; k+ 4.3; na++ 142; liver normal albumin 3.6; sed rate 76; CRP 3.0 04-29-16: wbc 16.3; hgb 10.1; hct 32.0; mcv 90.7 plt 317 04-30-16: wbc 10.6; hgb 9.5; hct 29.4; mcv 89.8. ;plt 310 05-02-16: wbc 10.5; hgb 9.2; hct 28.9; mcv 89.5; plt 297; glucose 123; bun 8; creat 0.94; k+ 3.9; na++ 140      Review of Systems  Constitutional: Negative for malaise/fatigue.  Respiratory: Negative for cough and shortness of breath.   Cardiovascular: Negative for chest pain, palpitations and leg swelling.  Gastrointestinal: Negative for abdominal pain, constipation and heartburn.  Musculoskeletal: Negative for back pain, joint pain and myalgias.  Skin: Negative.   Neurological: Negative for dizziness.  Psychiatric/Behavioral: The patient is not nervous/anxious.      Physical Exam  Vitals reviewed. Constitutional: She is oriented to person, place, and time. She appears well-developed and well-nourished. No distress.  Obese   Eyes: Conjunctivae are normal.  Neck: Neck supple. No JVD present. No thyromegaly present.  Cardiovascular: Normal rate, regular rhythm and intact distal pulses.   Respiratory: Effort normal and breath sounds normal. No respiratory distress. She has no wheezes.  GI: Soft. Bowel sounds are normal. She  exhibits no distension. There is no tenderness.  Musculoskeletal: She exhibits no edema.  Able to move all extremities  Has left side weakness  Lymphadenopathy:    She has no cervical adenopathy.  Neurological: She is alert and oriented to person, place, and time.  Skin: Skin is warm and dry. She is not diaphoretic.  Psychiatric: She has a normal mood and affect.     ASSESSMENT/  PLAN:  1. Osteoarthritis: is status post right knee replacement (04-18-2016); will continue asa 325 mg daily  will continue volatern gel 2 gm four times daily will continue mobic 7.5 mg daily will monitor  2. Left ankle fracture: status hardware removal;(04-29-16)  will continue mobic 7.5 mg daily robaxin 750 mg twice daily as needed  will follow up with orthopedics as indicated  3. Chronic pain: will continue oxycontin 10 mg every 12 hours; will continue oxycodone 5-15 mg every 4 hours as needed for pain; is using voltaer gel 2 gm four times daily; mobic 7.5 mg daily and has robaxin 750 mg twice daily as needed  4. Hypertension: will continue hyzaar 100/12.5 mg daily   5. Depression with anxiety: will continue lexapro 10 mg daily   6. Allergic rhinitis: will continue singulair 10 mg daily   7. Urinary urge incontinence: will continue myrbetriq 25 mg daily   8. Smoker is off nicotine patches; not requesting to smoke   9. Constipation; will continue miralax daily as needed and senna s nightly as needed  10. CVA: with left side weakness: is presently stable will continue to monitor her status.     MD is aware of resident's narcotic use and is in agreement with current plan of care. We will attempt to wean resident as apropriate   Synthia Innocent NP [facility]  Contact [phone] Monday through Friday 8am- 5pm  After hours call [phone]

## 2016-07-20 DIAGNOSIS — M1990 Unspecified osteoarthritis, unspecified site: Secondary | ICD-10-CM | POA: Diagnosis not present

## 2016-07-20 DIAGNOSIS — Z9181 History of falling: Secondary | ICD-10-CM | POA: Diagnosis not present

## 2016-07-20 DIAGNOSIS — N3281 Overactive bladder: Secondary | ICD-10-CM | POA: Diagnosis not present

## 2016-07-20 DIAGNOSIS — M62442 Contracture of muscle, left hand: Secondary | ICD-10-CM | POA: Diagnosis not present

## 2016-07-20 DIAGNOSIS — I69354 Hemiplegia and hemiparesis following cerebral infarction affecting left non-dominant side: Secondary | ICD-10-CM | POA: Diagnosis not present

## 2016-07-20 DIAGNOSIS — I639 Cerebral infarction, unspecified: Secondary | ICD-10-CM | POA: Diagnosis not present

## 2016-07-20 DIAGNOSIS — I10 Essential (primary) hypertension: Secondary | ICD-10-CM | POA: Diagnosis not present

## 2016-07-20 DIAGNOSIS — E119 Type 2 diabetes mellitus without complications: Secondary | ICD-10-CM | POA: Diagnosis not present

## 2016-07-21 DIAGNOSIS — I10 Essential (primary) hypertension: Secondary | ICD-10-CM | POA: Diagnosis not present

## 2016-07-21 DIAGNOSIS — M1990 Unspecified osteoarthritis, unspecified site: Secondary | ICD-10-CM | POA: Diagnosis not present

## 2016-07-21 DIAGNOSIS — I69354 Hemiplegia and hemiparesis following cerebral infarction affecting left non-dominant side: Secondary | ICD-10-CM | POA: Diagnosis not present

## 2016-07-21 DIAGNOSIS — Z9181 History of falling: Secondary | ICD-10-CM | POA: Diagnosis not present

## 2016-07-21 DIAGNOSIS — N3281 Overactive bladder: Secondary | ICD-10-CM | POA: Diagnosis not present

## 2016-07-21 DIAGNOSIS — I639 Cerebral infarction, unspecified: Secondary | ICD-10-CM | POA: Diagnosis not present

## 2016-07-21 DIAGNOSIS — M62442 Contracture of muscle, left hand: Secondary | ICD-10-CM | POA: Diagnosis not present

## 2016-07-21 DIAGNOSIS — E119 Type 2 diabetes mellitus without complications: Secondary | ICD-10-CM | POA: Diagnosis not present

## 2016-07-22 DIAGNOSIS — E119 Type 2 diabetes mellitus without complications: Secondary | ICD-10-CM | POA: Diagnosis not present

## 2016-07-22 DIAGNOSIS — I639 Cerebral infarction, unspecified: Secondary | ICD-10-CM | POA: Diagnosis not present

## 2016-07-22 DIAGNOSIS — M1990 Unspecified osteoarthritis, unspecified site: Secondary | ICD-10-CM | POA: Diagnosis not present

## 2016-07-22 DIAGNOSIS — I10 Essential (primary) hypertension: Secondary | ICD-10-CM | POA: Diagnosis not present

## 2016-07-22 DIAGNOSIS — Z9181 History of falling: Secondary | ICD-10-CM | POA: Diagnosis not present

## 2016-07-22 DIAGNOSIS — N3281 Overactive bladder: Secondary | ICD-10-CM | POA: Diagnosis not present

## 2016-07-22 DIAGNOSIS — I69354 Hemiplegia and hemiparesis following cerebral infarction affecting left non-dominant side: Secondary | ICD-10-CM | POA: Diagnosis not present

## 2016-07-22 DIAGNOSIS — M62442 Contracture of muscle, left hand: Secondary | ICD-10-CM | POA: Diagnosis not present

## 2016-07-23 DIAGNOSIS — I69354 Hemiplegia and hemiparesis following cerebral infarction affecting left non-dominant side: Secondary | ICD-10-CM | POA: Diagnosis not present

## 2016-07-23 DIAGNOSIS — I10 Essential (primary) hypertension: Secondary | ICD-10-CM | POA: Diagnosis not present

## 2016-07-23 DIAGNOSIS — E119 Type 2 diabetes mellitus without complications: Secondary | ICD-10-CM | POA: Diagnosis not present

## 2016-07-23 DIAGNOSIS — M1990 Unspecified osteoarthritis, unspecified site: Secondary | ICD-10-CM | POA: Diagnosis not present

## 2016-07-23 DIAGNOSIS — N3281 Overactive bladder: Secondary | ICD-10-CM | POA: Diagnosis not present

## 2016-07-23 DIAGNOSIS — Z9181 History of falling: Secondary | ICD-10-CM | POA: Diagnosis not present

## 2016-07-23 DIAGNOSIS — M62442 Contracture of muscle, left hand: Secondary | ICD-10-CM | POA: Diagnosis not present

## 2016-07-23 DIAGNOSIS — I639 Cerebral infarction, unspecified: Secondary | ICD-10-CM | POA: Diagnosis not present

## 2016-07-26 DIAGNOSIS — M62442 Contracture of muscle, left hand: Secondary | ICD-10-CM | POA: Diagnosis not present

## 2016-07-26 DIAGNOSIS — I10 Essential (primary) hypertension: Secondary | ICD-10-CM | POA: Diagnosis not present

## 2016-07-26 DIAGNOSIS — Z9181 History of falling: Secondary | ICD-10-CM | POA: Diagnosis not present

## 2016-07-26 DIAGNOSIS — I639 Cerebral infarction, unspecified: Secondary | ICD-10-CM | POA: Diagnosis not present

## 2016-07-26 DIAGNOSIS — N3281 Overactive bladder: Secondary | ICD-10-CM | POA: Diagnosis not present

## 2016-07-26 DIAGNOSIS — E119 Type 2 diabetes mellitus without complications: Secondary | ICD-10-CM | POA: Diagnosis not present

## 2016-07-26 DIAGNOSIS — M1990 Unspecified osteoarthritis, unspecified site: Secondary | ICD-10-CM | POA: Diagnosis not present

## 2016-07-26 DIAGNOSIS — I69354 Hemiplegia and hemiparesis following cerebral infarction affecting left non-dominant side: Secondary | ICD-10-CM | POA: Diagnosis not present

## 2016-07-27 DIAGNOSIS — E119 Type 2 diabetes mellitus without complications: Secondary | ICD-10-CM | POA: Diagnosis not present

## 2016-07-27 DIAGNOSIS — M62442 Contracture of muscle, left hand: Secondary | ICD-10-CM | POA: Diagnosis not present

## 2016-07-27 DIAGNOSIS — Z9181 History of falling: Secondary | ICD-10-CM | POA: Diagnosis not present

## 2016-07-27 DIAGNOSIS — N3281 Overactive bladder: Secondary | ICD-10-CM | POA: Diagnosis not present

## 2016-07-27 DIAGNOSIS — M1990 Unspecified osteoarthritis, unspecified site: Secondary | ICD-10-CM | POA: Diagnosis not present

## 2016-07-27 DIAGNOSIS — I69354 Hemiplegia and hemiparesis following cerebral infarction affecting left non-dominant side: Secondary | ICD-10-CM | POA: Diagnosis not present

## 2016-07-27 DIAGNOSIS — I10 Essential (primary) hypertension: Secondary | ICD-10-CM | POA: Diagnosis not present

## 2016-07-27 DIAGNOSIS — I639 Cerebral infarction, unspecified: Secondary | ICD-10-CM | POA: Diagnosis not present

## 2016-07-28 DIAGNOSIS — I639 Cerebral infarction, unspecified: Secondary | ICD-10-CM | POA: Diagnosis not present

## 2016-07-28 DIAGNOSIS — I10 Essential (primary) hypertension: Secondary | ICD-10-CM | POA: Diagnosis not present

## 2016-07-28 DIAGNOSIS — I69354 Hemiplegia and hemiparesis following cerebral infarction affecting left non-dominant side: Secondary | ICD-10-CM | POA: Diagnosis not present

## 2016-07-28 DIAGNOSIS — N3281 Overactive bladder: Secondary | ICD-10-CM | POA: Diagnosis not present

## 2016-07-28 DIAGNOSIS — M62442 Contracture of muscle, left hand: Secondary | ICD-10-CM | POA: Diagnosis not present

## 2016-07-28 DIAGNOSIS — E119 Type 2 diabetes mellitus without complications: Secondary | ICD-10-CM | POA: Diagnosis not present

## 2016-07-28 DIAGNOSIS — Z9181 History of falling: Secondary | ICD-10-CM | POA: Diagnosis not present

## 2016-07-28 DIAGNOSIS — M1990 Unspecified osteoarthritis, unspecified site: Secondary | ICD-10-CM | POA: Diagnosis not present

## 2016-07-29 DIAGNOSIS — Z9181 History of falling: Secondary | ICD-10-CM | POA: Diagnosis not present

## 2016-07-29 DIAGNOSIS — I10 Essential (primary) hypertension: Secondary | ICD-10-CM | POA: Diagnosis not present

## 2016-07-29 DIAGNOSIS — E119 Type 2 diabetes mellitus without complications: Secondary | ICD-10-CM | POA: Diagnosis not present

## 2016-07-29 DIAGNOSIS — M62442 Contracture of muscle, left hand: Secondary | ICD-10-CM | POA: Diagnosis not present

## 2016-07-29 DIAGNOSIS — I639 Cerebral infarction, unspecified: Secondary | ICD-10-CM | POA: Diagnosis not present

## 2016-07-29 DIAGNOSIS — N3281 Overactive bladder: Secondary | ICD-10-CM | POA: Diagnosis not present

## 2016-07-29 DIAGNOSIS — M1990 Unspecified osteoarthritis, unspecified site: Secondary | ICD-10-CM | POA: Diagnosis not present

## 2016-07-29 DIAGNOSIS — I69354 Hemiplegia and hemiparesis following cerebral infarction affecting left non-dominant side: Secondary | ICD-10-CM | POA: Diagnosis not present

## 2016-07-30 DIAGNOSIS — I639 Cerebral infarction, unspecified: Secondary | ICD-10-CM | POA: Diagnosis not present

## 2016-07-30 DIAGNOSIS — N3281 Overactive bladder: Secondary | ICD-10-CM | POA: Diagnosis not present

## 2016-07-30 DIAGNOSIS — I10 Essential (primary) hypertension: Secondary | ICD-10-CM | POA: Diagnosis not present

## 2016-07-30 DIAGNOSIS — E119 Type 2 diabetes mellitus without complications: Secondary | ICD-10-CM | POA: Diagnosis not present

## 2016-07-30 DIAGNOSIS — M62442 Contracture of muscle, left hand: Secondary | ICD-10-CM | POA: Diagnosis not present

## 2016-07-30 DIAGNOSIS — Z9181 History of falling: Secondary | ICD-10-CM | POA: Diagnosis not present

## 2016-07-30 DIAGNOSIS — M1990 Unspecified osteoarthritis, unspecified site: Secondary | ICD-10-CM | POA: Diagnosis not present

## 2016-07-30 DIAGNOSIS — I69354 Hemiplegia and hemiparesis following cerebral infarction affecting left non-dominant side: Secondary | ICD-10-CM | POA: Diagnosis not present

## 2016-08-02 DIAGNOSIS — M1990 Unspecified osteoarthritis, unspecified site: Secondary | ICD-10-CM | POA: Diagnosis not present

## 2016-08-02 DIAGNOSIS — N3281 Overactive bladder: Secondary | ICD-10-CM | POA: Diagnosis not present

## 2016-08-02 DIAGNOSIS — M62442 Contracture of muscle, left hand: Secondary | ICD-10-CM | POA: Diagnosis not present

## 2016-08-02 DIAGNOSIS — I639 Cerebral infarction, unspecified: Secondary | ICD-10-CM | POA: Diagnosis not present

## 2016-08-02 DIAGNOSIS — Z9181 History of falling: Secondary | ICD-10-CM | POA: Diagnosis not present

## 2016-08-02 DIAGNOSIS — I69354 Hemiplegia and hemiparesis following cerebral infarction affecting left non-dominant side: Secondary | ICD-10-CM | POA: Diagnosis not present

## 2016-08-02 DIAGNOSIS — I10 Essential (primary) hypertension: Secondary | ICD-10-CM | POA: Diagnosis not present

## 2016-08-02 DIAGNOSIS — E119 Type 2 diabetes mellitus without complications: Secondary | ICD-10-CM | POA: Diagnosis not present

## 2016-08-03 DIAGNOSIS — I69354 Hemiplegia and hemiparesis following cerebral infarction affecting left non-dominant side: Secondary | ICD-10-CM | POA: Diagnosis not present

## 2016-08-03 DIAGNOSIS — I10 Essential (primary) hypertension: Secondary | ICD-10-CM | POA: Diagnosis not present

## 2016-08-03 DIAGNOSIS — M1990 Unspecified osteoarthritis, unspecified site: Secondary | ICD-10-CM | POA: Diagnosis not present

## 2016-08-03 DIAGNOSIS — Z9181 History of falling: Secondary | ICD-10-CM | POA: Diagnosis not present

## 2016-08-03 DIAGNOSIS — N3281 Overactive bladder: Secondary | ICD-10-CM | POA: Diagnosis not present

## 2016-08-03 DIAGNOSIS — E119 Type 2 diabetes mellitus without complications: Secondary | ICD-10-CM | POA: Diagnosis not present

## 2016-08-03 DIAGNOSIS — I639 Cerebral infarction, unspecified: Secondary | ICD-10-CM | POA: Diagnosis not present

## 2016-08-03 DIAGNOSIS — M62442 Contracture of muscle, left hand: Secondary | ICD-10-CM | POA: Diagnosis not present

## 2016-08-04 DIAGNOSIS — E119 Type 2 diabetes mellitus without complications: Secondary | ICD-10-CM | POA: Diagnosis not present

## 2016-08-04 DIAGNOSIS — I639 Cerebral infarction, unspecified: Secondary | ICD-10-CM | POA: Diagnosis not present

## 2016-08-04 DIAGNOSIS — I10 Essential (primary) hypertension: Secondary | ICD-10-CM | POA: Diagnosis not present

## 2016-08-04 DIAGNOSIS — M1990 Unspecified osteoarthritis, unspecified site: Secondary | ICD-10-CM | POA: Diagnosis not present

## 2016-08-04 DIAGNOSIS — I69354 Hemiplegia and hemiparesis following cerebral infarction affecting left non-dominant side: Secondary | ICD-10-CM | POA: Diagnosis not present

## 2016-08-04 DIAGNOSIS — Z9181 History of falling: Secondary | ICD-10-CM | POA: Diagnosis not present

## 2016-08-04 DIAGNOSIS — M62442 Contracture of muscle, left hand: Secondary | ICD-10-CM | POA: Diagnosis not present

## 2016-08-04 DIAGNOSIS — N3281 Overactive bladder: Secondary | ICD-10-CM | POA: Diagnosis not present

## 2016-08-05 DIAGNOSIS — M6281 Muscle weakness (generalized): Secondary | ICD-10-CM | POA: Diagnosis not present

## 2016-08-05 DIAGNOSIS — E119 Type 2 diabetes mellitus without complications: Secondary | ICD-10-CM | POA: Diagnosis not present

## 2016-08-05 DIAGNOSIS — S82843S Displaced bimalleolar fracture of unspecified lower leg, sequela: Secondary | ICD-10-CM | POA: Diagnosis not present

## 2016-08-05 DIAGNOSIS — I10 Essential (primary) hypertension: Secondary | ICD-10-CM | POA: Diagnosis not present

## 2016-08-05 DIAGNOSIS — I69354 Hemiplegia and hemiparesis following cerebral infarction affecting left non-dominant side: Secondary | ICD-10-CM | POA: Diagnosis not present

## 2016-08-05 DIAGNOSIS — N3281 Overactive bladder: Secondary | ICD-10-CM | POA: Diagnosis not present

## 2016-08-05 DIAGNOSIS — Z9181 History of falling: Secondary | ICD-10-CM | POA: Diagnosis not present

## 2016-08-05 DIAGNOSIS — I639 Cerebral infarction, unspecified: Secondary | ICD-10-CM | POA: Diagnosis not present

## 2016-08-05 DIAGNOSIS — M1990 Unspecified osteoarthritis, unspecified site: Secondary | ICD-10-CM | POA: Diagnosis not present

## 2016-08-05 DIAGNOSIS — M62442 Contracture of muscle, left hand: Secondary | ICD-10-CM | POA: Diagnosis not present

## 2016-08-06 DIAGNOSIS — I10 Essential (primary) hypertension: Secondary | ICD-10-CM | POA: Diagnosis not present

## 2016-08-06 DIAGNOSIS — I69354 Hemiplegia and hemiparesis following cerebral infarction affecting left non-dominant side: Secondary | ICD-10-CM | POA: Diagnosis not present

## 2016-08-06 DIAGNOSIS — M62442 Contracture of muscle, left hand: Secondary | ICD-10-CM | POA: Diagnosis not present

## 2016-08-06 DIAGNOSIS — I639 Cerebral infarction, unspecified: Secondary | ICD-10-CM | POA: Diagnosis not present

## 2016-08-06 DIAGNOSIS — M1990 Unspecified osteoarthritis, unspecified site: Secondary | ICD-10-CM | POA: Diagnosis not present

## 2016-08-06 DIAGNOSIS — N3281 Overactive bladder: Secondary | ICD-10-CM | POA: Diagnosis not present

## 2016-08-06 DIAGNOSIS — E119 Type 2 diabetes mellitus without complications: Secondary | ICD-10-CM | POA: Diagnosis not present

## 2016-08-06 DIAGNOSIS — Z9181 History of falling: Secondary | ICD-10-CM | POA: Diagnosis not present

## 2016-08-10 DIAGNOSIS — M62442 Contracture of muscle, left hand: Secondary | ICD-10-CM | POA: Diagnosis not present

## 2016-08-10 DIAGNOSIS — I69354 Hemiplegia and hemiparesis following cerebral infarction affecting left non-dominant side: Secondary | ICD-10-CM | POA: Diagnosis not present

## 2016-08-10 DIAGNOSIS — M1990 Unspecified osteoarthritis, unspecified site: Secondary | ICD-10-CM | POA: Diagnosis not present

## 2016-08-10 DIAGNOSIS — Z9181 History of falling: Secondary | ICD-10-CM | POA: Diagnosis not present

## 2016-08-10 DIAGNOSIS — I639 Cerebral infarction, unspecified: Secondary | ICD-10-CM | POA: Diagnosis not present

## 2016-08-10 DIAGNOSIS — N3281 Overactive bladder: Secondary | ICD-10-CM | POA: Diagnosis not present

## 2016-08-10 DIAGNOSIS — E119 Type 2 diabetes mellitus without complications: Secondary | ICD-10-CM | POA: Diagnosis not present

## 2016-08-10 DIAGNOSIS — I10 Essential (primary) hypertension: Secondary | ICD-10-CM | POA: Diagnosis not present

## 2016-08-11 DIAGNOSIS — M1990 Unspecified osteoarthritis, unspecified site: Secondary | ICD-10-CM | POA: Diagnosis not present

## 2016-08-11 DIAGNOSIS — I69354 Hemiplegia and hemiparesis following cerebral infarction affecting left non-dominant side: Secondary | ICD-10-CM | POA: Diagnosis not present

## 2016-08-11 DIAGNOSIS — M62442 Contracture of muscle, left hand: Secondary | ICD-10-CM | POA: Diagnosis not present

## 2016-08-11 DIAGNOSIS — I639 Cerebral infarction, unspecified: Secondary | ICD-10-CM | POA: Diagnosis not present

## 2016-08-11 DIAGNOSIS — Z9181 History of falling: Secondary | ICD-10-CM | POA: Diagnosis not present

## 2016-08-11 DIAGNOSIS — I10 Essential (primary) hypertension: Secondary | ICD-10-CM | POA: Diagnosis not present

## 2016-08-11 DIAGNOSIS — E119 Type 2 diabetes mellitus without complications: Secondary | ICD-10-CM | POA: Diagnosis not present

## 2016-08-11 DIAGNOSIS — N3281 Overactive bladder: Secondary | ICD-10-CM | POA: Diagnosis not present

## 2016-08-12 DIAGNOSIS — M62442 Contracture of muscle, left hand: Secondary | ICD-10-CM | POA: Diagnosis not present

## 2016-08-12 DIAGNOSIS — I69354 Hemiplegia and hemiparesis following cerebral infarction affecting left non-dominant side: Secondary | ICD-10-CM | POA: Diagnosis not present

## 2016-08-12 DIAGNOSIS — Z9181 History of falling: Secondary | ICD-10-CM | POA: Diagnosis not present

## 2016-08-12 DIAGNOSIS — I10 Essential (primary) hypertension: Secondary | ICD-10-CM | POA: Diagnosis not present

## 2016-08-12 DIAGNOSIS — M1990 Unspecified osteoarthritis, unspecified site: Secondary | ICD-10-CM | POA: Diagnosis not present

## 2016-08-12 DIAGNOSIS — I639 Cerebral infarction, unspecified: Secondary | ICD-10-CM | POA: Diagnosis not present

## 2016-08-12 DIAGNOSIS — N3281 Overactive bladder: Secondary | ICD-10-CM | POA: Diagnosis not present

## 2016-08-12 DIAGNOSIS — E119 Type 2 diabetes mellitus without complications: Secondary | ICD-10-CM | POA: Diagnosis not present

## 2016-08-13 DIAGNOSIS — E119 Type 2 diabetes mellitus without complications: Secondary | ICD-10-CM | POA: Diagnosis not present

## 2016-08-13 DIAGNOSIS — I69354 Hemiplegia and hemiparesis following cerebral infarction affecting left non-dominant side: Secondary | ICD-10-CM | POA: Diagnosis not present

## 2016-08-13 DIAGNOSIS — I639 Cerebral infarction, unspecified: Secondary | ICD-10-CM | POA: Diagnosis not present

## 2016-08-13 DIAGNOSIS — M1990 Unspecified osteoarthritis, unspecified site: Secondary | ICD-10-CM | POA: Diagnosis not present

## 2016-08-13 DIAGNOSIS — Z9181 History of falling: Secondary | ICD-10-CM | POA: Diagnosis not present

## 2016-08-13 DIAGNOSIS — I10 Essential (primary) hypertension: Secondary | ICD-10-CM | POA: Diagnosis not present

## 2016-08-13 DIAGNOSIS — N3281 Overactive bladder: Secondary | ICD-10-CM | POA: Diagnosis not present

## 2016-08-13 DIAGNOSIS — M62442 Contracture of muscle, left hand: Secondary | ICD-10-CM | POA: Diagnosis not present

## 2016-08-15 DIAGNOSIS — M62442 Contracture of muscle, left hand: Secondary | ICD-10-CM | POA: Diagnosis not present

## 2016-08-15 DIAGNOSIS — Z9181 History of falling: Secondary | ICD-10-CM | POA: Diagnosis not present

## 2016-08-15 DIAGNOSIS — M1990 Unspecified osteoarthritis, unspecified site: Secondary | ICD-10-CM | POA: Diagnosis not present

## 2016-08-15 DIAGNOSIS — I639 Cerebral infarction, unspecified: Secondary | ICD-10-CM | POA: Diagnosis not present

## 2016-08-15 DIAGNOSIS — N3281 Overactive bladder: Secondary | ICD-10-CM | POA: Diagnosis not present

## 2016-08-15 DIAGNOSIS — E119 Type 2 diabetes mellitus without complications: Secondary | ICD-10-CM | POA: Diagnosis not present

## 2016-08-15 DIAGNOSIS — I10 Essential (primary) hypertension: Secondary | ICD-10-CM | POA: Diagnosis not present

## 2016-08-15 DIAGNOSIS — I69354 Hemiplegia and hemiparesis following cerebral infarction affecting left non-dominant side: Secondary | ICD-10-CM | POA: Diagnosis not present

## 2016-08-16 DIAGNOSIS — I639 Cerebral infarction, unspecified: Secondary | ICD-10-CM | POA: Diagnosis not present

## 2016-08-16 DIAGNOSIS — I69354 Hemiplegia and hemiparesis following cerebral infarction affecting left non-dominant side: Secondary | ICD-10-CM | POA: Diagnosis not present

## 2016-08-16 DIAGNOSIS — N3281 Overactive bladder: Secondary | ICD-10-CM | POA: Diagnosis not present

## 2016-08-16 DIAGNOSIS — E119 Type 2 diabetes mellitus without complications: Secondary | ICD-10-CM | POA: Diagnosis not present

## 2016-08-16 DIAGNOSIS — I10 Essential (primary) hypertension: Secondary | ICD-10-CM | POA: Diagnosis not present

## 2016-08-16 DIAGNOSIS — Z9181 History of falling: Secondary | ICD-10-CM | POA: Diagnosis not present

## 2016-08-16 DIAGNOSIS — M62442 Contracture of muscle, left hand: Secondary | ICD-10-CM | POA: Diagnosis not present

## 2016-08-16 DIAGNOSIS — M1990 Unspecified osteoarthritis, unspecified site: Secondary | ICD-10-CM | POA: Diagnosis not present

## 2016-08-17 DIAGNOSIS — I10 Essential (primary) hypertension: Secondary | ICD-10-CM | POA: Diagnosis not present

## 2016-08-17 DIAGNOSIS — N3281 Overactive bladder: Secondary | ICD-10-CM | POA: Diagnosis not present

## 2016-08-17 DIAGNOSIS — I639 Cerebral infarction, unspecified: Secondary | ICD-10-CM | POA: Diagnosis not present

## 2016-08-17 DIAGNOSIS — I69354 Hemiplegia and hemiparesis following cerebral infarction affecting left non-dominant side: Secondary | ICD-10-CM | POA: Diagnosis not present

## 2016-08-17 DIAGNOSIS — M1990 Unspecified osteoarthritis, unspecified site: Secondary | ICD-10-CM | POA: Diagnosis not present

## 2016-08-17 DIAGNOSIS — E119 Type 2 diabetes mellitus without complications: Secondary | ICD-10-CM | POA: Diagnosis not present

## 2016-08-17 DIAGNOSIS — Z9181 History of falling: Secondary | ICD-10-CM | POA: Diagnosis not present

## 2016-08-17 DIAGNOSIS — M62442 Contracture of muscle, left hand: Secondary | ICD-10-CM | POA: Diagnosis not present

## 2016-08-18 DIAGNOSIS — N3281 Overactive bladder: Secondary | ICD-10-CM | POA: Diagnosis not present

## 2016-08-18 DIAGNOSIS — Z9181 History of falling: Secondary | ICD-10-CM | POA: Diagnosis not present

## 2016-08-18 DIAGNOSIS — M62442 Contracture of muscle, left hand: Secondary | ICD-10-CM | POA: Diagnosis not present

## 2016-08-18 DIAGNOSIS — I10 Essential (primary) hypertension: Secondary | ICD-10-CM | POA: Diagnosis not present

## 2016-08-18 DIAGNOSIS — E119 Type 2 diabetes mellitus without complications: Secondary | ICD-10-CM | POA: Diagnosis not present

## 2016-08-18 DIAGNOSIS — I639 Cerebral infarction, unspecified: Secondary | ICD-10-CM | POA: Diagnosis not present

## 2016-08-18 DIAGNOSIS — M1990 Unspecified osteoarthritis, unspecified site: Secondary | ICD-10-CM | POA: Diagnosis not present

## 2016-08-18 DIAGNOSIS — I69354 Hemiplegia and hemiparesis following cerebral infarction affecting left non-dominant side: Secondary | ICD-10-CM | POA: Diagnosis not present

## 2016-08-19 ENCOUNTER — Encounter: Payer: Self-pay | Admitting: Adult Health

## 2016-08-19 ENCOUNTER — Non-Acute Institutional Stay (SKILLED_NURSING_FACILITY): Payer: Medicare Other | Admitting: Adult Health

## 2016-08-19 DIAGNOSIS — Z9181 History of falling: Secondary | ICD-10-CM | POA: Diagnosis not present

## 2016-08-19 DIAGNOSIS — S82852D Displaced trimalleolar fracture of left lower leg, subsequent encounter for closed fracture with routine healing: Secondary | ICD-10-CM | POA: Diagnosis not present

## 2016-08-19 DIAGNOSIS — I639 Cerebral infarction, unspecified: Secondary | ICD-10-CM | POA: Diagnosis not present

## 2016-08-19 DIAGNOSIS — N3281 Overactive bladder: Secondary | ICD-10-CM | POA: Diagnosis not present

## 2016-08-19 DIAGNOSIS — M170 Bilateral primary osteoarthritis of knee: Secondary | ICD-10-CM | POA: Diagnosis not present

## 2016-08-19 DIAGNOSIS — E119 Type 2 diabetes mellitus without complications: Secondary | ICD-10-CM | POA: Diagnosis not present

## 2016-08-19 DIAGNOSIS — M1990 Unspecified osteoarthritis, unspecified site: Secondary | ICD-10-CM | POA: Diagnosis not present

## 2016-08-19 DIAGNOSIS — I10 Essential (primary) hypertension: Secondary | ICD-10-CM | POA: Diagnosis not present

## 2016-08-19 DIAGNOSIS — I69354 Hemiplegia and hemiparesis following cerebral infarction affecting left non-dominant side: Secondary | ICD-10-CM | POA: Diagnosis not present

## 2016-08-19 DIAGNOSIS — J309 Allergic rhinitis, unspecified: Secondary | ICD-10-CM | POA: Diagnosis not present

## 2016-08-19 DIAGNOSIS — M62442 Contracture of muscle, left hand: Secondary | ICD-10-CM | POA: Diagnosis not present

## 2016-08-19 DIAGNOSIS — G894 Chronic pain syndrome: Secondary | ICD-10-CM

## 2016-08-19 DIAGNOSIS — N3941 Urge incontinence: Secondary | ICD-10-CM | POA: Diagnosis not present

## 2016-08-19 DIAGNOSIS — F17200 Nicotine dependence, unspecified, uncomplicated: Secondary | ICD-10-CM | POA: Diagnosis not present

## 2016-08-19 NOTE — Progress Notes (Signed)
Patient ID: Jamie Haas, female   DOB: [birth year], 70 y.o.   MRN: [id number]   Location:   [facility] Room Number: 133-B Place of Service:  SNF (31)   CODE STATUS: Full Code  No Known Allergies  Chief Complaint  Patient presents with  . Medical Management of Chronic Issues    Follow up    HPI:  She is a long term resident of this facility being seen for the management of her chronic illnesses. Overall there is little change in her status. She tells me that she is feeling good. She does get out of bed on a daily basis. There are no nursing concerns at this time.    Past Medical History:  Diagnosis Date  . 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 Arthritis   . Depression   . Hypertension   . Stroke [facility]) 2011   left sided weakness    Past Surgical History:  Procedure Laterality Date  . BRAIN SURGERY  2011   coils inserted  . HARDWARE REMOVAL Left 04/29/2016   Procedure: HARDWARE REMOVAL LEFT ANKLE;  Surgeon: Tarry Kos, MD;  [facility]: [facility] OR;  Service: Orthopedics;  Laterality: Left;  . ORIF ANKLE FRACTURE Left 12/24/2015   Procedure: OPEN REDUCTION INTERNAL FIXATION (ORIF) ANKLE FRACTURE;  Surgeon: Tarry Kos, MD;  Location: [facility] OR;  Service: Orthopedics;  Laterality: Left;  . TOTAL KNEE ARTHROPLASTY    . TOTAL KNEE ARTHROPLASTY Right 04/29/2016   Procedure: RIGHT TOTAL KNEE ARTHROPLASTY;  Surgeon: Tarry Kos, MD;  [facility]: [facility];  Service: Orthopedics;  Laterality: Right;  . VULVAR LESION REMOVAL  03/17/2012   Procedure: VULVAR LESION;  Surgeon: Brock Bad, MD;  Location: [facility] ORS;  Service: Gynecology;  Laterality: N/A;  CO2 Laser Vaporization Of Condyloma    Social History   Social History  . Marital status: Legally Separated    Spouse name: N/A  . Number of children: N/A  . Years of education: N/A   Occupational History  . Not on file.   Social History Main Topics  . Smoking status: Current Every Day Smoker    Packs/day: 1.50    Years: 30.00  . Smokeless tobacco: Never Used  . Alcohol use No  . Drug use: No  . Sexual activity: Not on file   Other Topics Concern  . Not on file   Social History Narrative  . No narrative on file   Family History  Problem Relation Age of Onset  . Hypertension Mother   . Osteoarthritis Mother   . Cancer Mother       VITAL SIGNS BP 131/78   Pulse 72   Temp 98.7 F (37.1 C) (Oral)   Resp 18   Ht 5\' 5"  (1.651 m)   Wt 196 lb 2 oz (89 kg)   SpO2 97%   BMI 32.64 kg/m   Patient's Medications  New Prescriptions   No medications on file  Previous Medications   ACETAMINOPHEN (TYLENOL) 500 MG TABLET    Take 500 mg by mouth 2 (two) times daily as needed for mild pain.   ASPIRIN 325 MG TABLET    Take 325 mg by mouth daily.   DICLOFENAC SODIUM (VOLTAREN) 1 % GEL    Apply 2 g topically 4 (four) times daily.   ESCITALOPRAM (LEXAPRO) 10 MG TABLET    Take 10 mg by mouth daily.   LORATADINE (CLARITIN) 10 MG  TABLET    Take 10 mg by mouth daily.   LOSARTAN-HYDROCHLOROTHIAZIDE (HYZAAR) 100-12.5 MG TABLET    Take 1 tablet by mouth daily.   MELOXICAM (MOBIC) 7.5 MG TABLET    Take 7.5 mg by mouth daily.   METHOCARBAMOL (ROBAXIN) 750 MG TABLET    Take 1 tablet (750 mg total) by mouth 2 (two) times daily as needed for muscle spasms.   MONTELUKAST (SINGULAIR) 10 MG TABLET    Take 10 mg by mouth daily.    MYRBETRIQ 25 MG TB24 TABLET    Take 25 mg by mouth daily.   ONDANSETRON (ZOFRAN) 4 MG TABLET    Take 4 mg by mouth every 8 (eight) hours as needed for nausea or vomiting.   OXYCODONE (OXY IR/ROXICODONE) 5 MG IMMEDIATE RELEASE TABLET    Take 1-3 tablets (5-15 mg total) by mouth every 4 (four) hours as needed.   OXYCODONE (OXYCONTIN) 10 MG 12 HR TABLET    Take 1 tablet (10 mg total) by mouth every 12 (twelve) hours.   POLYETHYLENE GLYCOL (MIRALAX / GLYCOLAX) PACKET    Take 17 g by mouth daily as needed for mild constipation.   SENNA-DOCUSATE (SENOKOT S) 8.6-50 MG TABLET     Take 1 tablet by mouth at bedtime as needed.  Modified Medications   No medications on file  Discontinued Medications   No medications on file     SIGNIFICANT DIAGNOSTIC EXAMS  04-29-16: right knee x-ray: Status post knee arthroplasty.  No adverse features   04-29-16: left ankle x-ray; Intraoperative images demonstrating ORIF of the ankle. Intraoperative localization.  06-16-16: screening mammogram: No mammographic evidence of malignancy.   LABS REVIEWED:   04-22-16: wbc 8.0; hgb 11.8; hct 37.0; mcv 91.1; plt 410; glucose 91; bun 10; creat 0.87; k+ 4.3; na++ 142; liver normal albumin 3.6; sed rate 76; CRP 3.0 04-29-16: wbc 16.3; hgb 10.1; hct 32.0; mcv 90.7 plt 317 04-30-16: wbc 10.6; hgb 9.5; hct 29.4; mcv 89.8. ;plt 310 05-02-16: wbc 10.5; hgb 9.2; hct 28.9; mcv 89.5; plt 297; glucose 123; bun 8; creat 0.94; k+ 3.9; na++ 140      Review of Systems  Constitutional: Negative for malaise/fatigue.  Respiratory: Negative for cough and shortness of breath.   Cardiovascular: Negative for chest pain, palpitations and leg swelling.  Gastrointestinal: Negative for abdominal pain, constipation and heartburn.  Musculoskeletal: Negative for back pain, joint pain and myalgias.  Skin: Negative.   Neurological: Negative for dizziness.  Psychiatric/Behavioral: The patient is not nervous/anxious.      Physical Exam  Vitals reviewed. Constitutional: She is oriented to person, place, and time. She appears well-developed and well-nourished. No distress.  Obese   Eyes: Conjunctivae are normal.  Neck: Neck supple. No JVD present. No thyromegaly present.  Cardiovascular: Normal rate, regular rhythm and intact distal pulses.   Respiratory: Effort normal and breath sounds normal. No respiratory distress. She has no wheezes.  GI: Soft. Bowel sounds are normal. She exhibits no distension. There is no tenderness.  Musculoskeletal: She exhibits no edema.  Able to move all extremities  Has left side  weakness  Lymphadenopathy:    She has no cervical adenopathy.  Neurological: She is alert and oriented to person, place, and time.  Skin: Skin is warm and dry. She is not diaphoretic.  Psychiatric: She has a normal mood and affect.     ASSESSMENT/ PLAN:  1. Osteoarthritis: is status post right knee replacement (04-18-2016); will  continue volatern gel 2 gm four times  daily will continue mobic 7.5 mg daily will monitor  2. Left ankle fracture: status hardware removal;(04-29-16)  will continue mobic 7.5 mg daily robaxin 750 mg twice daily as needed  will follow up with orthopedics as indicated  3. Chronic pain: will continue oxycontin 10 mg every 12 hours; will continue oxycodone 5-15 mg every 4 hours as needed for pain; is using voltaer gel 2 gm four times daily; mobic 7.5 mg daily and has robaxin 750 mg twice daily as needed  4. Hypertension: will continue hyzaar 100/12.5 mg daily   5. Depression with anxiety: will continue lexapro 10 mg daily   6. Allergic rhinitis: will continue singulair 10 mg daily   7. Urinary urge incontinence: will continue myrbetriq 25 mg daily   8. Smoker is off nicotine patches; not requesting to smoke   9. Constipation; will continue miralax daily as needed and senna s nightly as needed  10. CVA: with left side weakness: is presently stable will continue asa 325 mg daily and will   monitor her status.     MD is aware of resident's narcotic use and is in agreement with current plan of care. We will attempt to wean resident as apropriate   Synthia Innocenteborah Buddie Marston NP [facility]  Contact [phone] Monday through Friday 8am- 5pm  After hours call [phone]

## 2016-08-20 DIAGNOSIS — I69354 Hemiplegia and hemiparesis following cerebral infarction affecting left non-dominant side: Secondary | ICD-10-CM | POA: Diagnosis not present

## 2016-08-20 DIAGNOSIS — N3281 Overactive bladder: Secondary | ICD-10-CM | POA: Diagnosis not present

## 2016-08-20 DIAGNOSIS — M1990 Unspecified osteoarthritis, unspecified site: Secondary | ICD-10-CM | POA: Diagnosis not present

## 2016-08-20 DIAGNOSIS — M62442 Contracture of muscle, left hand: Secondary | ICD-10-CM | POA: Diagnosis not present

## 2016-08-20 DIAGNOSIS — E119 Type 2 diabetes mellitus without complications: Secondary | ICD-10-CM | POA: Diagnosis not present

## 2016-08-20 DIAGNOSIS — Z9181 History of falling: Secondary | ICD-10-CM | POA: Diagnosis not present

## 2016-08-20 DIAGNOSIS — I10 Essential (primary) hypertension: Secondary | ICD-10-CM | POA: Diagnosis not present

## 2016-08-20 DIAGNOSIS — I639 Cerebral infarction, unspecified: Secondary | ICD-10-CM | POA: Diagnosis not present

## 2016-08-23 DIAGNOSIS — Z9181 History of falling: Secondary | ICD-10-CM | POA: Diagnosis not present

## 2016-08-23 DIAGNOSIS — I10 Essential (primary) hypertension: Secondary | ICD-10-CM | POA: Diagnosis not present

## 2016-08-23 DIAGNOSIS — I639 Cerebral infarction, unspecified: Secondary | ICD-10-CM | POA: Diagnosis not present

## 2016-08-23 DIAGNOSIS — M1990 Unspecified osteoarthritis, unspecified site: Secondary | ICD-10-CM | POA: Diagnosis not present

## 2016-08-23 DIAGNOSIS — N3281 Overactive bladder: Secondary | ICD-10-CM | POA: Diagnosis not present

## 2016-08-23 DIAGNOSIS — M62442 Contracture of muscle, left hand: Secondary | ICD-10-CM | POA: Diagnosis not present

## 2016-08-23 DIAGNOSIS — E119 Type 2 diabetes mellitus without complications: Secondary | ICD-10-CM | POA: Diagnosis not present

## 2016-08-23 DIAGNOSIS — I69354 Hemiplegia and hemiparesis following cerebral infarction affecting left non-dominant side: Secondary | ICD-10-CM | POA: Diagnosis not present

## 2016-08-24 DIAGNOSIS — Z9181 History of falling: Secondary | ICD-10-CM | POA: Diagnosis not present

## 2016-08-24 DIAGNOSIS — I10 Essential (primary) hypertension: Secondary | ICD-10-CM | POA: Diagnosis not present

## 2016-08-24 DIAGNOSIS — I69354 Hemiplegia and hemiparesis following cerebral infarction affecting left non-dominant side: Secondary | ICD-10-CM | POA: Diagnosis not present

## 2016-08-24 DIAGNOSIS — M62442 Contracture of muscle, left hand: Secondary | ICD-10-CM | POA: Diagnosis not present

## 2016-08-24 DIAGNOSIS — M1990 Unspecified osteoarthritis, unspecified site: Secondary | ICD-10-CM | POA: Diagnosis not present

## 2016-08-24 DIAGNOSIS — N3281 Overactive bladder: Secondary | ICD-10-CM | POA: Diagnosis not present

## 2016-08-24 DIAGNOSIS — I639 Cerebral infarction, unspecified: Secondary | ICD-10-CM | POA: Diagnosis not present

## 2016-08-24 DIAGNOSIS — E119 Type 2 diabetes mellitus without complications: Secondary | ICD-10-CM | POA: Diagnosis not present

## 2016-08-25 DIAGNOSIS — M1990 Unspecified osteoarthritis, unspecified site: Secondary | ICD-10-CM | POA: Diagnosis not present

## 2016-08-25 DIAGNOSIS — M62442 Contracture of muscle, left hand: Secondary | ICD-10-CM | POA: Diagnosis not present

## 2016-08-25 DIAGNOSIS — I639 Cerebral infarction, unspecified: Secondary | ICD-10-CM | POA: Diagnosis not present

## 2016-08-25 DIAGNOSIS — I10 Essential (primary) hypertension: Secondary | ICD-10-CM | POA: Diagnosis not present

## 2016-08-25 DIAGNOSIS — Z9181 History of falling: Secondary | ICD-10-CM | POA: Diagnosis not present

## 2016-08-25 DIAGNOSIS — N3281 Overactive bladder: Secondary | ICD-10-CM | POA: Diagnosis not present

## 2016-08-25 DIAGNOSIS — I69354 Hemiplegia and hemiparesis following cerebral infarction affecting left non-dominant side: Secondary | ICD-10-CM | POA: Diagnosis not present

## 2016-08-25 DIAGNOSIS — E119 Type 2 diabetes mellitus without complications: Secondary | ICD-10-CM | POA: Diagnosis not present

## 2016-08-26 DIAGNOSIS — E119 Type 2 diabetes mellitus without complications: Secondary | ICD-10-CM | POA: Diagnosis not present

## 2016-08-26 DIAGNOSIS — M62442 Contracture of muscle, left hand: Secondary | ICD-10-CM | POA: Diagnosis not present

## 2016-08-26 DIAGNOSIS — Z9181 History of falling: Secondary | ICD-10-CM | POA: Diagnosis not present

## 2016-08-26 DIAGNOSIS — I639 Cerebral infarction, unspecified: Secondary | ICD-10-CM | POA: Diagnosis not present

## 2016-08-26 DIAGNOSIS — I10 Essential (primary) hypertension: Secondary | ICD-10-CM | POA: Diagnosis not present

## 2016-08-26 DIAGNOSIS — N3281 Overactive bladder: Secondary | ICD-10-CM | POA: Diagnosis not present

## 2016-08-26 DIAGNOSIS — I69354 Hemiplegia and hemiparesis following cerebral infarction affecting left non-dominant side: Secondary | ICD-10-CM | POA: Diagnosis not present

## 2016-08-26 DIAGNOSIS — M1990 Unspecified osteoarthritis, unspecified site: Secondary | ICD-10-CM | POA: Diagnosis not present

## 2016-08-27 DIAGNOSIS — I69354 Hemiplegia and hemiparesis following cerebral infarction affecting left non-dominant side: Secondary | ICD-10-CM | POA: Diagnosis not present

## 2016-08-27 DIAGNOSIS — E119 Type 2 diabetes mellitus without complications: Secondary | ICD-10-CM | POA: Diagnosis not present

## 2016-08-27 DIAGNOSIS — I639 Cerebral infarction, unspecified: Secondary | ICD-10-CM | POA: Diagnosis not present

## 2016-08-27 DIAGNOSIS — N3281 Overactive bladder: Secondary | ICD-10-CM | POA: Diagnosis not present

## 2016-08-27 DIAGNOSIS — I10 Essential (primary) hypertension: Secondary | ICD-10-CM | POA: Diagnosis not present

## 2016-08-27 DIAGNOSIS — Z9181 History of falling: Secondary | ICD-10-CM | POA: Diagnosis not present

## 2016-08-27 DIAGNOSIS — M62442 Contracture of muscle, left hand: Secondary | ICD-10-CM | POA: Diagnosis not present

## 2016-08-27 DIAGNOSIS — M1990 Unspecified osteoarthritis, unspecified site: Secondary | ICD-10-CM | POA: Diagnosis not present

## 2016-08-28 DIAGNOSIS — N3281 Overactive bladder: Secondary | ICD-10-CM | POA: Diagnosis not present

## 2016-08-28 DIAGNOSIS — E119 Type 2 diabetes mellitus without complications: Secondary | ICD-10-CM | POA: Diagnosis not present

## 2016-08-28 DIAGNOSIS — I639 Cerebral infarction, unspecified: Secondary | ICD-10-CM | POA: Diagnosis not present

## 2016-08-28 DIAGNOSIS — I10 Essential (primary) hypertension: Secondary | ICD-10-CM | POA: Diagnosis not present

## 2016-08-28 DIAGNOSIS — M62442 Contracture of muscle, left hand: Secondary | ICD-10-CM | POA: Diagnosis not present

## 2016-08-28 DIAGNOSIS — Z9181 History of falling: Secondary | ICD-10-CM | POA: Diagnosis not present

## 2016-08-28 DIAGNOSIS — M1990 Unspecified osteoarthritis, unspecified site: Secondary | ICD-10-CM | POA: Diagnosis not present

## 2016-08-28 DIAGNOSIS — I69354 Hemiplegia and hemiparesis following cerebral infarction affecting left non-dominant side: Secondary | ICD-10-CM | POA: Diagnosis not present

## 2016-08-29 DIAGNOSIS — N3281 Overactive bladder: Secondary | ICD-10-CM | POA: Diagnosis not present

## 2016-08-29 DIAGNOSIS — I69354 Hemiplegia and hemiparesis following cerebral infarction affecting left non-dominant side: Secondary | ICD-10-CM | POA: Diagnosis not present

## 2016-08-29 DIAGNOSIS — M1990 Unspecified osteoarthritis, unspecified site: Secondary | ICD-10-CM | POA: Diagnosis not present

## 2016-08-29 DIAGNOSIS — M62442 Contracture of muscle, left hand: Secondary | ICD-10-CM | POA: Diagnosis not present

## 2016-08-29 DIAGNOSIS — I10 Essential (primary) hypertension: Secondary | ICD-10-CM | POA: Diagnosis not present

## 2016-08-29 DIAGNOSIS — E119 Type 2 diabetes mellitus without complications: Secondary | ICD-10-CM | POA: Diagnosis not present

## 2016-08-29 DIAGNOSIS — I639 Cerebral infarction, unspecified: Secondary | ICD-10-CM | POA: Diagnosis not present

## 2016-08-29 DIAGNOSIS — Z9181 History of falling: Secondary | ICD-10-CM | POA: Diagnosis not present

## 2016-08-30 DIAGNOSIS — I10 Essential (primary) hypertension: Secondary | ICD-10-CM | POA: Diagnosis not present

## 2016-08-30 DIAGNOSIS — E119 Type 2 diabetes mellitus without complications: Secondary | ICD-10-CM | POA: Diagnosis not present

## 2016-08-30 DIAGNOSIS — M62442 Contracture of muscle, left hand: Secondary | ICD-10-CM | POA: Diagnosis not present

## 2016-08-30 DIAGNOSIS — I639 Cerebral infarction, unspecified: Secondary | ICD-10-CM | POA: Diagnosis not present

## 2016-08-30 DIAGNOSIS — I69354 Hemiplegia and hemiparesis following cerebral infarction affecting left non-dominant side: Secondary | ICD-10-CM | POA: Diagnosis not present

## 2016-08-30 DIAGNOSIS — Z9181 History of falling: Secondary | ICD-10-CM | POA: Diagnosis not present

## 2016-08-30 DIAGNOSIS — M1990 Unspecified osteoarthritis, unspecified site: Secondary | ICD-10-CM | POA: Diagnosis not present

## 2016-08-30 DIAGNOSIS — N3281 Overactive bladder: Secondary | ICD-10-CM | POA: Diagnosis not present

## 2016-08-31 DIAGNOSIS — Z9181 History of falling: Secondary | ICD-10-CM | POA: Diagnosis not present

## 2016-08-31 DIAGNOSIS — I10 Essential (primary) hypertension: Secondary | ICD-10-CM | POA: Diagnosis not present

## 2016-08-31 DIAGNOSIS — E119 Type 2 diabetes mellitus without complications: Secondary | ICD-10-CM | POA: Diagnosis not present

## 2016-08-31 DIAGNOSIS — M62442 Contracture of muscle, left hand: Secondary | ICD-10-CM | POA: Diagnosis not present

## 2016-08-31 DIAGNOSIS — I69354 Hemiplegia and hemiparesis following cerebral infarction affecting left non-dominant side: Secondary | ICD-10-CM | POA: Diagnosis not present

## 2016-08-31 DIAGNOSIS — I639 Cerebral infarction, unspecified: Secondary | ICD-10-CM | POA: Diagnosis not present

## 2016-08-31 DIAGNOSIS — M1990 Unspecified osteoarthritis, unspecified site: Secondary | ICD-10-CM | POA: Diagnosis not present

## 2016-08-31 DIAGNOSIS — N3281 Overactive bladder: Secondary | ICD-10-CM | POA: Diagnosis not present

## 2016-09-01 DIAGNOSIS — E119 Type 2 diabetes mellitus without complications: Secondary | ICD-10-CM | POA: Diagnosis not present

## 2016-09-01 DIAGNOSIS — N3281 Overactive bladder: Secondary | ICD-10-CM | POA: Diagnosis not present

## 2016-09-01 DIAGNOSIS — I69354 Hemiplegia and hemiparesis following cerebral infarction affecting left non-dominant side: Secondary | ICD-10-CM | POA: Diagnosis not present

## 2016-09-01 DIAGNOSIS — I639 Cerebral infarction, unspecified: Secondary | ICD-10-CM | POA: Diagnosis not present

## 2016-09-01 DIAGNOSIS — I10 Essential (primary) hypertension: Secondary | ICD-10-CM | POA: Diagnosis not present

## 2016-09-01 DIAGNOSIS — Z9181 History of falling: Secondary | ICD-10-CM | POA: Diagnosis not present

## 2016-09-01 DIAGNOSIS — M62442 Contracture of muscle, left hand: Secondary | ICD-10-CM | POA: Diagnosis not present

## 2016-09-01 DIAGNOSIS — M1990 Unspecified osteoarthritis, unspecified site: Secondary | ICD-10-CM | POA: Diagnosis not present

## 2016-09-02 DIAGNOSIS — Z9181 History of falling: Secondary | ICD-10-CM | POA: Diagnosis not present

## 2016-09-02 DIAGNOSIS — M62442 Contracture of muscle, left hand: Secondary | ICD-10-CM | POA: Diagnosis not present

## 2016-09-02 DIAGNOSIS — E119 Type 2 diabetes mellitus without complications: Secondary | ICD-10-CM | POA: Diagnosis not present

## 2016-09-02 DIAGNOSIS — I639 Cerebral infarction, unspecified: Secondary | ICD-10-CM | POA: Diagnosis not present

## 2016-09-02 DIAGNOSIS — I69354 Hemiplegia and hemiparesis following cerebral infarction affecting left non-dominant side: Secondary | ICD-10-CM | POA: Diagnosis not present

## 2016-09-02 DIAGNOSIS — N3281 Overactive bladder: Secondary | ICD-10-CM | POA: Diagnosis not present

## 2016-09-02 DIAGNOSIS — M1990 Unspecified osteoarthritis, unspecified site: Secondary | ICD-10-CM | POA: Diagnosis not present

## 2016-09-02 DIAGNOSIS — I10 Essential (primary) hypertension: Secondary | ICD-10-CM | POA: Diagnosis not present

## 2016-09-03 DIAGNOSIS — M1990 Unspecified osteoarthritis, unspecified site: Secondary | ICD-10-CM | POA: Diagnosis not present

## 2016-09-03 DIAGNOSIS — I10 Essential (primary) hypertension: Secondary | ICD-10-CM | POA: Diagnosis not present

## 2016-09-03 DIAGNOSIS — I69354 Hemiplegia and hemiparesis following cerebral infarction affecting left non-dominant side: Secondary | ICD-10-CM | POA: Diagnosis not present

## 2016-09-03 DIAGNOSIS — E119 Type 2 diabetes mellitus without complications: Secondary | ICD-10-CM | POA: Diagnosis not present

## 2016-09-03 DIAGNOSIS — I639 Cerebral infarction, unspecified: Secondary | ICD-10-CM | POA: Diagnosis not present

## 2016-09-03 DIAGNOSIS — Z9181 History of falling: Secondary | ICD-10-CM | POA: Diagnosis not present

## 2016-09-03 DIAGNOSIS — M62442 Contracture of muscle, left hand: Secondary | ICD-10-CM | POA: Diagnosis not present

## 2016-09-03 DIAGNOSIS — N3281 Overactive bladder: Secondary | ICD-10-CM | POA: Diagnosis not present

## 2016-09-04 DIAGNOSIS — M6281 Muscle weakness (generalized): Secondary | ICD-10-CM | POA: Diagnosis not present

## 2016-09-04 DIAGNOSIS — I639 Cerebral infarction, unspecified: Secondary | ICD-10-CM | POA: Diagnosis not present

## 2016-09-04 DIAGNOSIS — S82843S Displaced bimalleolar fracture of unspecified lower leg, sequela: Secondary | ICD-10-CM | POA: Diagnosis not present

## 2016-09-04 DIAGNOSIS — M1990 Unspecified osteoarthritis, unspecified site: Secondary | ICD-10-CM | POA: Diagnosis not present

## 2016-09-06 DIAGNOSIS — I69354 Hemiplegia and hemiparesis following cerebral infarction affecting left non-dominant side: Secondary | ICD-10-CM | POA: Diagnosis not present

## 2016-09-06 DIAGNOSIS — I10 Essential (primary) hypertension: Secondary | ICD-10-CM | POA: Diagnosis not present

## 2016-09-06 DIAGNOSIS — E119 Type 2 diabetes mellitus without complications: Secondary | ICD-10-CM | POA: Diagnosis not present

## 2016-09-06 DIAGNOSIS — M1990 Unspecified osteoarthritis, unspecified site: Secondary | ICD-10-CM | POA: Diagnosis not present

## 2016-09-06 DIAGNOSIS — Z9181 History of falling: Secondary | ICD-10-CM | POA: Diagnosis not present

## 2016-09-06 DIAGNOSIS — M62442 Contracture of muscle, left hand: Secondary | ICD-10-CM | POA: Diagnosis not present

## 2016-09-06 DIAGNOSIS — I639 Cerebral infarction, unspecified: Secondary | ICD-10-CM | POA: Diagnosis not present

## 2016-09-06 DIAGNOSIS — N3281 Overactive bladder: Secondary | ICD-10-CM | POA: Diagnosis not present

## 2016-09-07 DIAGNOSIS — M1990 Unspecified osteoarthritis, unspecified site: Secondary | ICD-10-CM | POA: Diagnosis not present

## 2016-09-07 DIAGNOSIS — M62442 Contracture of muscle, left hand: Secondary | ICD-10-CM | POA: Diagnosis not present

## 2016-09-07 DIAGNOSIS — I639 Cerebral infarction, unspecified: Secondary | ICD-10-CM | POA: Diagnosis not present

## 2016-09-07 DIAGNOSIS — N3281 Overactive bladder: Secondary | ICD-10-CM | POA: Diagnosis not present

## 2016-09-07 DIAGNOSIS — I10 Essential (primary) hypertension: Secondary | ICD-10-CM | POA: Diagnosis not present

## 2016-09-07 DIAGNOSIS — E119 Type 2 diabetes mellitus without complications: Secondary | ICD-10-CM | POA: Diagnosis not present

## 2016-09-07 DIAGNOSIS — Z9181 History of falling: Secondary | ICD-10-CM | POA: Diagnosis not present

## 2016-09-07 DIAGNOSIS — I69354 Hemiplegia and hemiparesis following cerebral infarction affecting left non-dominant side: Secondary | ICD-10-CM | POA: Diagnosis not present

## 2016-09-08 DIAGNOSIS — I69354 Hemiplegia and hemiparesis following cerebral infarction affecting left non-dominant side: Secondary | ICD-10-CM | POA: Diagnosis not present

## 2016-09-08 DIAGNOSIS — N3281 Overactive bladder: Secondary | ICD-10-CM | POA: Diagnosis not present

## 2016-09-08 DIAGNOSIS — M1990 Unspecified osteoarthritis, unspecified site: Secondary | ICD-10-CM | POA: Diagnosis not present

## 2016-09-08 DIAGNOSIS — I10 Essential (primary) hypertension: Secondary | ICD-10-CM | POA: Diagnosis not present

## 2016-09-08 DIAGNOSIS — M62442 Contracture of muscle, left hand: Secondary | ICD-10-CM | POA: Diagnosis not present

## 2016-09-08 DIAGNOSIS — E119 Type 2 diabetes mellitus without complications: Secondary | ICD-10-CM | POA: Diagnosis not present

## 2016-09-08 DIAGNOSIS — I639 Cerebral infarction, unspecified: Secondary | ICD-10-CM | POA: Diagnosis not present

## 2016-09-08 DIAGNOSIS — Z9181 History of falling: Secondary | ICD-10-CM | POA: Diagnosis not present

## 2016-09-09 DIAGNOSIS — I10 Essential (primary) hypertension: Secondary | ICD-10-CM | POA: Diagnosis not present

## 2016-09-09 DIAGNOSIS — M1990 Unspecified osteoarthritis, unspecified site: Secondary | ICD-10-CM | POA: Diagnosis not present

## 2016-09-09 DIAGNOSIS — Z9181 History of falling: Secondary | ICD-10-CM | POA: Diagnosis not present

## 2016-09-09 DIAGNOSIS — I69354 Hemiplegia and hemiparesis following cerebral infarction affecting left non-dominant side: Secondary | ICD-10-CM | POA: Diagnosis not present

## 2016-09-09 DIAGNOSIS — I639 Cerebral infarction, unspecified: Secondary | ICD-10-CM | POA: Diagnosis not present

## 2016-09-09 DIAGNOSIS — E119 Type 2 diabetes mellitus without complications: Secondary | ICD-10-CM | POA: Diagnosis not present

## 2016-09-09 DIAGNOSIS — M62442 Contracture of muscle, left hand: Secondary | ICD-10-CM | POA: Diagnosis not present

## 2016-09-09 DIAGNOSIS — N3281 Overactive bladder: Secondary | ICD-10-CM | POA: Diagnosis not present

## 2016-09-09 LAB — HEPATIC FUNCTION PANEL
ALT: 6 U/L — AB (ref 7–35)
AST: 12 U/L — AB (ref 13–35)
Alkaline Phosphatase: 100 U/L (ref 25–125)
Bilirubin, Total: 0.2 mg/dL

## 2016-09-09 LAB — CBC AND DIFFERENTIAL
HEMATOCRIT: 33 % — AB (ref 36–46)
Hemoglobin: 10.4 g/dL — AB (ref 12.0–16.0)
Neutrophils Absolute: 3 /uL
Platelets: 281 10*3/uL (ref 150–399)
WBC: 5.8 10*3/mL

## 2016-09-09 LAB — BASIC METABOLIC PANEL
BUN: 17 mg/dL (ref 4–21)
CREATININE: 0.8 mg/dL (ref 0.5–1.1)
GLUCOSE: 77 mg/dL
POTASSIUM: 4.2 mmol/L (ref 3.4–5.3)
Sodium: 141 mmol/L (ref 137–147)

## 2016-09-10 DIAGNOSIS — E119 Type 2 diabetes mellitus without complications: Secondary | ICD-10-CM | POA: Diagnosis not present

## 2016-09-10 DIAGNOSIS — N3281 Overactive bladder: Secondary | ICD-10-CM | POA: Diagnosis not present

## 2016-09-10 DIAGNOSIS — I639 Cerebral infarction, unspecified: Secondary | ICD-10-CM | POA: Diagnosis not present

## 2016-09-10 DIAGNOSIS — M62442 Contracture of muscle, left hand: Secondary | ICD-10-CM | POA: Diagnosis not present

## 2016-09-10 DIAGNOSIS — I10 Essential (primary) hypertension: Secondary | ICD-10-CM | POA: Diagnosis not present

## 2016-09-10 DIAGNOSIS — I69354 Hemiplegia and hemiparesis following cerebral infarction affecting left non-dominant side: Secondary | ICD-10-CM | POA: Diagnosis not present

## 2016-09-10 DIAGNOSIS — Z9181 History of falling: Secondary | ICD-10-CM | POA: Diagnosis not present

## 2016-09-10 DIAGNOSIS — M1990 Unspecified osteoarthritis, unspecified site: Secondary | ICD-10-CM | POA: Diagnosis not present

## 2016-09-11 DIAGNOSIS — E119 Type 2 diabetes mellitus without complications: Secondary | ICD-10-CM | POA: Diagnosis not present

## 2016-09-11 DIAGNOSIS — M62442 Contracture of muscle, left hand: Secondary | ICD-10-CM | POA: Diagnosis not present

## 2016-09-11 DIAGNOSIS — I639 Cerebral infarction, unspecified: Secondary | ICD-10-CM | POA: Diagnosis not present

## 2016-09-11 DIAGNOSIS — I69354 Hemiplegia and hemiparesis following cerebral infarction affecting left non-dominant side: Secondary | ICD-10-CM | POA: Diagnosis not present

## 2016-09-11 DIAGNOSIS — I10 Essential (primary) hypertension: Secondary | ICD-10-CM | POA: Diagnosis not present

## 2016-09-11 DIAGNOSIS — M1990 Unspecified osteoarthritis, unspecified site: Secondary | ICD-10-CM | POA: Diagnosis not present

## 2016-09-11 DIAGNOSIS — Z9181 History of falling: Secondary | ICD-10-CM | POA: Diagnosis not present

## 2016-09-11 DIAGNOSIS — N3281 Overactive bladder: Secondary | ICD-10-CM | POA: Diagnosis not present

## 2016-09-13 DIAGNOSIS — M1990 Unspecified osteoarthritis, unspecified site: Secondary | ICD-10-CM | POA: Diagnosis not present

## 2016-09-13 DIAGNOSIS — M62442 Contracture of muscle, left hand: Secondary | ICD-10-CM | POA: Diagnosis not present

## 2016-09-13 DIAGNOSIS — I10 Essential (primary) hypertension: Secondary | ICD-10-CM | POA: Diagnosis not present

## 2016-09-13 DIAGNOSIS — I639 Cerebral infarction, unspecified: Secondary | ICD-10-CM | POA: Diagnosis not present

## 2016-09-13 DIAGNOSIS — Z9181 History of falling: Secondary | ICD-10-CM | POA: Diagnosis not present

## 2016-09-13 DIAGNOSIS — N3281 Overactive bladder: Secondary | ICD-10-CM | POA: Diagnosis not present

## 2016-09-13 DIAGNOSIS — E119 Type 2 diabetes mellitus without complications: Secondary | ICD-10-CM | POA: Diagnosis not present

## 2016-09-13 DIAGNOSIS — I69354 Hemiplegia and hemiparesis following cerebral infarction affecting left non-dominant side: Secondary | ICD-10-CM | POA: Diagnosis not present

## 2016-09-14 DIAGNOSIS — Z9181 History of falling: Secondary | ICD-10-CM | POA: Diagnosis not present

## 2016-09-14 DIAGNOSIS — I10 Essential (primary) hypertension: Secondary | ICD-10-CM | POA: Diagnosis not present

## 2016-09-14 DIAGNOSIS — N3281 Overactive bladder: Secondary | ICD-10-CM | POA: Diagnosis not present

## 2016-09-14 DIAGNOSIS — E119 Type 2 diabetes mellitus without complications: Secondary | ICD-10-CM | POA: Diagnosis not present

## 2016-09-14 DIAGNOSIS — M62442 Contracture of muscle, left hand: Secondary | ICD-10-CM | POA: Diagnosis not present

## 2016-09-14 DIAGNOSIS — M1990 Unspecified osteoarthritis, unspecified site: Secondary | ICD-10-CM | POA: Diagnosis not present

## 2016-09-14 DIAGNOSIS — I639 Cerebral infarction, unspecified: Secondary | ICD-10-CM | POA: Diagnosis not present

## 2016-09-14 DIAGNOSIS — I69354 Hemiplegia and hemiparesis following cerebral infarction affecting left non-dominant side: Secondary | ICD-10-CM | POA: Diagnosis not present

## 2016-09-15 DIAGNOSIS — M1990 Unspecified osteoarthritis, unspecified site: Secondary | ICD-10-CM | POA: Diagnosis not present

## 2016-09-15 DIAGNOSIS — E119 Type 2 diabetes mellitus without complications: Secondary | ICD-10-CM | POA: Diagnosis not present

## 2016-09-15 DIAGNOSIS — I69354 Hemiplegia and hemiparesis following cerebral infarction affecting left non-dominant side: Secondary | ICD-10-CM | POA: Diagnosis not present

## 2016-09-15 DIAGNOSIS — I639 Cerebral infarction, unspecified: Secondary | ICD-10-CM | POA: Diagnosis not present

## 2016-09-15 DIAGNOSIS — Z9181 History of falling: Secondary | ICD-10-CM | POA: Diagnosis not present

## 2016-09-15 DIAGNOSIS — N3281 Overactive bladder: Secondary | ICD-10-CM | POA: Diagnosis not present

## 2016-09-15 DIAGNOSIS — M62442 Contracture of muscle, left hand: Secondary | ICD-10-CM | POA: Diagnosis not present

## 2016-09-15 DIAGNOSIS — I10 Essential (primary) hypertension: Secondary | ICD-10-CM | POA: Diagnosis not present

## 2016-09-16 DIAGNOSIS — I639 Cerebral infarction, unspecified: Secondary | ICD-10-CM | POA: Diagnosis not present

## 2016-09-16 DIAGNOSIS — I10 Essential (primary) hypertension: Secondary | ICD-10-CM | POA: Diagnosis not present

## 2016-09-16 DIAGNOSIS — N3281 Overactive bladder: Secondary | ICD-10-CM | POA: Diagnosis not present

## 2016-09-16 DIAGNOSIS — Z9181 History of falling: Secondary | ICD-10-CM | POA: Diagnosis not present

## 2016-09-16 DIAGNOSIS — I69354 Hemiplegia and hemiparesis following cerebral infarction affecting left non-dominant side: Secondary | ICD-10-CM | POA: Diagnosis not present

## 2016-09-16 DIAGNOSIS — M62442 Contracture of muscle, left hand: Secondary | ICD-10-CM | POA: Diagnosis not present

## 2016-09-16 DIAGNOSIS — M1990 Unspecified osteoarthritis, unspecified site: Secondary | ICD-10-CM | POA: Diagnosis not present

## 2016-09-16 DIAGNOSIS — E119 Type 2 diabetes mellitus without complications: Secondary | ICD-10-CM | POA: Diagnosis not present

## 2016-09-17 DIAGNOSIS — I69354 Hemiplegia and hemiparesis following cerebral infarction affecting left non-dominant side: Secondary | ICD-10-CM | POA: Diagnosis not present

## 2016-09-17 DIAGNOSIS — E119 Type 2 diabetes mellitus without complications: Secondary | ICD-10-CM | POA: Diagnosis not present

## 2016-09-17 DIAGNOSIS — I639 Cerebral infarction, unspecified: Secondary | ICD-10-CM | POA: Diagnosis not present

## 2016-09-17 DIAGNOSIS — Z9181 History of falling: Secondary | ICD-10-CM | POA: Diagnosis not present

## 2016-09-17 DIAGNOSIS — N3281 Overactive bladder: Secondary | ICD-10-CM | POA: Diagnosis not present

## 2016-09-17 DIAGNOSIS — M1990 Unspecified osteoarthritis, unspecified site: Secondary | ICD-10-CM | POA: Diagnosis not present

## 2016-09-17 DIAGNOSIS — I10 Essential (primary) hypertension: Secondary | ICD-10-CM | POA: Diagnosis not present

## 2016-09-17 DIAGNOSIS — M62442 Contracture of muscle, left hand: Secondary | ICD-10-CM | POA: Diagnosis not present

## 2016-09-18 DIAGNOSIS — I10 Essential (primary) hypertension: Secondary | ICD-10-CM | POA: Diagnosis not present

## 2016-09-18 DIAGNOSIS — E119 Type 2 diabetes mellitus without complications: Secondary | ICD-10-CM | POA: Diagnosis not present

## 2016-09-18 DIAGNOSIS — N3281 Overactive bladder: Secondary | ICD-10-CM | POA: Diagnosis not present

## 2016-09-18 DIAGNOSIS — M1990 Unspecified osteoarthritis, unspecified site: Secondary | ICD-10-CM | POA: Diagnosis not present

## 2016-09-18 DIAGNOSIS — I639 Cerebral infarction, unspecified: Secondary | ICD-10-CM | POA: Diagnosis not present

## 2016-09-18 DIAGNOSIS — M62442 Contracture of muscle, left hand: Secondary | ICD-10-CM | POA: Diagnosis not present

## 2016-09-18 DIAGNOSIS — I69354 Hemiplegia and hemiparesis following cerebral infarction affecting left non-dominant side: Secondary | ICD-10-CM | POA: Diagnosis not present

## 2016-09-18 DIAGNOSIS — Z9181 History of falling: Secondary | ICD-10-CM | POA: Diagnosis not present

## 2016-09-20 ENCOUNTER — Non-Acute Institutional Stay (SKILLED_NURSING_FACILITY): Payer: Medicare Other | Admitting: Adult Health

## 2016-09-20 ENCOUNTER — Encounter: Payer: Self-pay | Admitting: Adult Health

## 2016-09-20 DIAGNOSIS — M170 Bilateral primary osteoarthritis of knee: Secondary | ICD-10-CM | POA: Diagnosis not present

## 2016-09-20 DIAGNOSIS — I639 Cerebral infarction, unspecified: Secondary | ICD-10-CM | POA: Diagnosis not present

## 2016-09-20 DIAGNOSIS — I69354 Hemiplegia and hemiparesis following cerebral infarction affecting left non-dominant side: Secondary | ICD-10-CM | POA: Diagnosis not present

## 2016-09-20 DIAGNOSIS — Z9181 History of falling: Secondary | ICD-10-CM | POA: Diagnosis not present

## 2016-09-20 DIAGNOSIS — M62442 Contracture of muscle, left hand: Secondary | ICD-10-CM | POA: Diagnosis not present

## 2016-09-20 DIAGNOSIS — S82842S Displaced bimalleolar fracture of left lower leg, sequela: Secondary | ICD-10-CM | POA: Diagnosis not present

## 2016-09-20 DIAGNOSIS — I10 Essential (primary) hypertension: Secondary | ICD-10-CM | POA: Diagnosis not present

## 2016-09-20 DIAGNOSIS — N3281 Overactive bladder: Secondary | ICD-10-CM | POA: Diagnosis not present

## 2016-09-20 DIAGNOSIS — M1990 Unspecified osteoarthritis, unspecified site: Secondary | ICD-10-CM | POA: Diagnosis not present

## 2016-09-20 DIAGNOSIS — G894 Chronic pain syndrome: Secondary | ICD-10-CM | POA: Diagnosis not present

## 2016-09-20 DIAGNOSIS — E119 Type 2 diabetes mellitus without complications: Secondary | ICD-10-CM | POA: Diagnosis not present

## 2016-09-20 DIAGNOSIS — Z9889 Other specified postprocedural states: Secondary | ICD-10-CM

## 2016-09-20 DIAGNOSIS — R531 Weakness: Secondary | ICD-10-CM | POA: Diagnosis not present

## 2016-09-20 NOTE — Progress Notes (Signed)
Patient ID: Jamie Haas, female   DOB: 1946-05-27, 70 y.o.   MRN: [id number]   Location:   [facility] Room Number: 133-B Place of Service:  SNF (31)   CODE STATUS: Full Code  No Known Allergies  Chief Complaint  Patient presents with  . Medical Management of Chronic Issues    Follow up    HPI:  She is a long term resident of this facility being seen for the management of her chronic illnesses. Overall her status is without change. She tells me that she is feeling good. There are no nursing concerns at this time  Past Medical History:  Diagnosis Date  . 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 Arthritis   . Depression   . Hypertension   . Stroke [facility]) 2011   left sided weakness    Past Surgical History:  Procedure Laterality Date  . BRAIN SURGERY  2011   coils inserted  . HARDWARE REMOVAL Left 04/29/2016   Procedure: HARDWARE REMOVAL LEFT ANKLE;  Surgeon: Tarry Kos, MD;  [facility]: [facility] OR;  Service: Orthopedics;  Laterality: Left;  . ORIF ANKLE FRACTURE Left 12/24/2015   Procedure: OPEN REDUCTION INTERNAL FIXATION (ORIF) ANKLE FRACTURE;  Surgeon: Tarry Kos, MD;  Location: [facility] OR;  Service: Orthopedics;  Laterality: Left;  . TOTAL KNEE ARTHROPLASTY    . TOTAL KNEE ARTHROPLASTY Right 04/29/2016   Procedure: RIGHT TOTAL KNEE ARTHROPLASTY;  Surgeon: Tarry Kos, MD;  Location: [facility] OR;  Service: Orthopedics;  Laterality: Right;  . VULVAR LESION REMOVAL  03/17/2012   Procedure: VULVAR LESION;  Surgeon: Brock Bad, MD;  Location: [facility] ORS;  Service: Gynecology;  Laterality: N/A;  CO2 Laser Vaporization Of Condyloma    Social History   Social History  . Marital status: Legally Separated    Spouse name: N/A  . Number of children: N/A  . Years of education: N/A   Occupational History  . Not on file.   Social History Main Topics  . Smoking status: Current Every Day Smoker    Packs/day: 1.50    Years: 30.00  . Smokeless  tobacco: Never Used  . Alcohol use No  . Drug use: No  . Sexual activity: Not on file   Other Topics Concern  . Not on file   Social History Narrative  . No narrative on file   Family History  Problem Relation Age of Onset  . Hypertension Mother   . Osteoarthritis Mother   . Cancer Mother       VITAL SIGNS BP 136/78   Pulse 80   Temp 98 F (36.7 C) (Oral)   Resp 18   Ht 5\' 5"  (1.651 m)   Wt 198 lb 6 oz (90 kg)   SpO2 98%   BMI 33.01 kg/m   Patient's Medications  New Prescriptions   No medications on file  Previous Medications   ACETAMINOPHEN (TYLENOL) 500 MG TABLET    Take 500 mg by mouth 2 (two) times daily as needed for mild pain.   ASPIRIN 325 MG TABLET    Take 325 mg by mouth daily.   DICLOFENAC SODIUM (VOLTAREN) 1 % GEL    Apply 2 g topically 4 (four) times daily.   ESCITALOPRAM (LEXAPRO) 10 MG TABLET    Take 10 mg by mouth daily.   LORATADINE (CLARITIN) 10 MG TABLET    Take 10 mg by mouth daily.   LOSARTAN-HYDROCHLOROTHIAZIDE (  HYZAAR) 100-12.5 MG TABLET    Take 1 tablet by mouth daily.   MELOXICAM (MOBIC) 7.5 MG TABLET    Take 7.5 mg by mouth daily.   METHOCARBAMOL (ROBAXIN) 750 MG TABLET    Take 1 tablet (750 mg total) by mouth 2 (two) times daily as needed for muscle spasms.   MONTELUKAST (SINGULAIR) 10 MG TABLET    Take 10 mg by mouth daily.    MYRBETRIQ 25 MG TB24 TABLET    Take 25 mg by mouth daily.   ONDANSETRON (ZOFRAN) 4 MG TABLET    Take 4 mg by mouth every 8 (eight) hours as needed for nausea or vomiting.   OXYCODONE (OXY IR/ROXICODONE) 5 MG IMMEDIATE RELEASE TABLET    Take 1-3 tablets (5-15 mg total) by mouth every 4 (four) hours as needed.   OXYCODONE (OXYCONTIN) 10 MG 12 HR TABLET    Take 1 tablet (10 mg total) by mouth every 12 (twelve) hours.   POLYETHYLENE GLYCOL (MIRALAX / GLYCOLAX) PACKET    Take 17 g by mouth daily as needed for mild constipation.   SENNA-DOCUSATE (SENOKOT S) 8.6-50 MG TABLET    Take 1 tablet by mouth at bedtime as needed.    Modified Medications   No medications on file  Discontinued Medications   No medications on file     SIGNIFICANT DIAGNOSTIC EXAMS  04-29-16: right knee x-ray: Status post knee arthroplasty.  No adverse features   04-29-16: left ankle x-ray; Intraoperative images demonstrating ORIF of the ankle. Intraoperative localization.  06-16-16: screening mammogram: No mammographic evidence of malignancy.   LABS REVIEWED:   04-22-16: wbc 8.0; hgb 11.8; hct 37.0; mcv 91.1; plt 410; glucose 91; bun 10; creat 0.87; k+ 4.3; na++ 142; liver normal albumin 3.6; sed rate 76; CRP 3.0 04-29-16: wbc 16.3; hgb 10.1; hct 32.0; mcv 90.7 plt 317 04-30-16: wbc 10.6; hgb 9.5; hct 29.4; mcv 89.8. ;plt 310 05-02-16: wbc 10.5; hgb 9.2; hct 28.9; mcv 89.5; plt 297; glucose 123; bun 8; creat 0.94; k+ 3.9; na++ 140  09-09-16: wbc 5.8; hgb 10.4; hct 32.8; mcv 92.9; plt 281; glucose 77; bun 16.9; creat 0.77; k+ 4.2; na++ 141; liver normal albumin 3.9     Review of Systems  Constitutional: Negative for malaise/fatigue.  Respiratory: Negative for cough and shortness of breath.   Cardiovascular: Negative for chest pain, palpitations and leg swelling.  Gastrointestinal: Negative for abdominal pain, constipation and heartburn.  Musculoskeletal: Negative for back pain, joint pain and myalgias.  Skin: Negative.   Neurological: Negative for dizziness.  Psychiatric/Behavioral: The patient is not nervous/anxious.      Physical Exam  Vitals reviewed. Constitutional: She is oriented to person, place, and time. She appears well-developed and well-nourished. No distress.  Obese   Eyes: Conjunctivae are normal.  Neck: Neck supple. No JVD present. No thyromegaly present.  Cardiovascular: Normal rate, regular rhythm and intact distal pulses.   Respiratory: Effort normal and breath sounds normal. No respiratory distress. She has no wheezes.  GI: Soft. Bowel sounds are normal. She exhibits no distension. There is no tenderness.   Musculoskeletal: She exhibits no edema.  Able to move all extremities  Has left side weakness  Lymphadenopathy:    She has no cervical adenopathy.  Neurological: She is alert and oriented to person, place, and time.  Skin: Skin is warm and dry. She is not diaphoretic.  Psychiatric: She has a normal mood and affect.     ASSESSMENT/ PLAN:  1. Osteoarthritis: is status post  right knee replacement (04-18-2016); will  continue volatern gel 2 gm four times daily will continue mobic 7.5 mg daily will monitor  2. Left ankle fracture: status hardware removal;(04-29-16)  will continue mobic 7.5 mg daily robaxin 750 mg twice daily as needed  will follow up with orthopedics as indicated  3. Chronic pain: will continue oxycontin 10 mg every 12 hours; will continue oxycodone 5-15 mg every 4 hours as needed for pain; is using voltaer gel 2 gm four times daily; mobic 7.5 mg daily and has robaxin 750 mg twice daily as needed  4. Hypertension: will continue hyzaar 100/12.5 mg daily   5. Depression with anxiety: will continue lexapro 10 mg daily   6. Allergic rhinitis: will continue singulair 10 mg daily   7. Urinary urge incontinence: will continue myrbetriq 25 mg daily   8. Smoker is off nicotine patches; not requesting to smoke   9. Constipation; will continue miralax daily as needed and senna s nightly as needed  10. CVA: with left side weakness: is presently stable will continue asa 325 mg daily and will   monitor her status.      MD is aware of resident's narcotic use and is in agreement with current plan of care. We will attempt to wean resident as apropriate   Synthia Innocenteborah Neshia Mckenzie NP [facility]  Contact [phone] Monday through Friday 8am- 5pm  After hours call [phone]

## 2016-09-21 DIAGNOSIS — M62442 Contracture of muscle, left hand: Secondary | ICD-10-CM | POA: Diagnosis not present

## 2016-09-21 DIAGNOSIS — E119 Type 2 diabetes mellitus without complications: Secondary | ICD-10-CM | POA: Diagnosis not present

## 2016-09-21 DIAGNOSIS — Z9181 History of falling: Secondary | ICD-10-CM | POA: Diagnosis not present

## 2016-09-21 DIAGNOSIS — I10 Essential (primary) hypertension: Secondary | ICD-10-CM | POA: Diagnosis not present

## 2016-09-21 DIAGNOSIS — N3281 Overactive bladder: Secondary | ICD-10-CM | POA: Diagnosis not present

## 2016-09-21 DIAGNOSIS — I69354 Hemiplegia and hemiparesis following cerebral infarction affecting left non-dominant side: Secondary | ICD-10-CM | POA: Diagnosis not present

## 2016-09-21 DIAGNOSIS — M1990 Unspecified osteoarthritis, unspecified site: Secondary | ICD-10-CM | POA: Diagnosis not present

## 2016-09-21 DIAGNOSIS — I639 Cerebral infarction, unspecified: Secondary | ICD-10-CM | POA: Diagnosis not present

## 2016-09-22 DIAGNOSIS — I69354 Hemiplegia and hemiparesis following cerebral infarction affecting left non-dominant side: Secondary | ICD-10-CM | POA: Diagnosis not present

## 2016-09-22 DIAGNOSIS — N3281 Overactive bladder: Secondary | ICD-10-CM | POA: Diagnosis not present

## 2016-09-22 DIAGNOSIS — M1990 Unspecified osteoarthritis, unspecified site: Secondary | ICD-10-CM | POA: Diagnosis not present

## 2016-09-22 DIAGNOSIS — I639 Cerebral infarction, unspecified: Secondary | ICD-10-CM | POA: Diagnosis not present

## 2016-09-22 DIAGNOSIS — I10 Essential (primary) hypertension: Secondary | ICD-10-CM | POA: Diagnosis not present

## 2016-09-22 DIAGNOSIS — M62442 Contracture of muscle, left hand: Secondary | ICD-10-CM | POA: Diagnosis not present

## 2016-09-22 DIAGNOSIS — E119 Type 2 diabetes mellitus without complications: Secondary | ICD-10-CM | POA: Diagnosis not present

## 2016-09-22 DIAGNOSIS — Z9181 History of falling: Secondary | ICD-10-CM | POA: Diagnosis not present

## 2016-09-23 DIAGNOSIS — E119 Type 2 diabetes mellitus without complications: Secondary | ICD-10-CM | POA: Diagnosis not present

## 2016-09-23 DIAGNOSIS — I639 Cerebral infarction, unspecified: Secondary | ICD-10-CM | POA: Diagnosis not present

## 2016-09-23 DIAGNOSIS — M1990 Unspecified osteoarthritis, unspecified site: Secondary | ICD-10-CM | POA: Diagnosis not present

## 2016-09-23 DIAGNOSIS — N3281 Overactive bladder: Secondary | ICD-10-CM | POA: Diagnosis not present

## 2016-09-23 DIAGNOSIS — I69354 Hemiplegia and hemiparesis following cerebral infarction affecting left non-dominant side: Secondary | ICD-10-CM | POA: Diagnosis not present

## 2016-09-23 DIAGNOSIS — Z9181 History of falling: Secondary | ICD-10-CM | POA: Diagnosis not present

## 2016-09-23 DIAGNOSIS — M62442 Contracture of muscle, left hand: Secondary | ICD-10-CM | POA: Diagnosis not present

## 2016-09-23 DIAGNOSIS — I10 Essential (primary) hypertension: Secondary | ICD-10-CM | POA: Diagnosis not present

## 2016-09-24 DIAGNOSIS — M1990 Unspecified osteoarthritis, unspecified site: Secondary | ICD-10-CM | POA: Diagnosis not present

## 2016-09-24 DIAGNOSIS — I69354 Hemiplegia and hemiparesis following cerebral infarction affecting left non-dominant side: Secondary | ICD-10-CM | POA: Diagnosis not present

## 2016-09-24 DIAGNOSIS — M62442 Contracture of muscle, left hand: Secondary | ICD-10-CM | POA: Diagnosis not present

## 2016-09-24 DIAGNOSIS — E119 Type 2 diabetes mellitus without complications: Secondary | ICD-10-CM | POA: Diagnosis not present

## 2016-09-24 DIAGNOSIS — Z9181 History of falling: Secondary | ICD-10-CM | POA: Diagnosis not present

## 2016-09-24 DIAGNOSIS — N3281 Overactive bladder: Secondary | ICD-10-CM | POA: Diagnosis not present

## 2016-09-24 DIAGNOSIS — I10 Essential (primary) hypertension: Secondary | ICD-10-CM | POA: Diagnosis not present

## 2016-09-24 DIAGNOSIS — I639 Cerebral infarction, unspecified: Secondary | ICD-10-CM | POA: Diagnosis not present

## 2016-09-27 DIAGNOSIS — E119 Type 2 diabetes mellitus without complications: Secondary | ICD-10-CM | POA: Diagnosis not present

## 2016-09-27 DIAGNOSIS — I69354 Hemiplegia and hemiparesis following cerebral infarction affecting left non-dominant side: Secondary | ICD-10-CM | POA: Diagnosis not present

## 2016-09-27 DIAGNOSIS — M1990 Unspecified osteoarthritis, unspecified site: Secondary | ICD-10-CM | POA: Diagnosis not present

## 2016-09-27 DIAGNOSIS — M62442 Contracture of muscle, left hand: Secondary | ICD-10-CM | POA: Diagnosis not present

## 2016-09-27 DIAGNOSIS — N3281 Overactive bladder: Secondary | ICD-10-CM | POA: Diagnosis not present

## 2016-09-27 DIAGNOSIS — Z9181 History of falling: Secondary | ICD-10-CM | POA: Diagnosis not present

## 2016-09-27 DIAGNOSIS — I10 Essential (primary) hypertension: Secondary | ICD-10-CM | POA: Diagnosis not present

## 2016-09-27 DIAGNOSIS — I639 Cerebral infarction, unspecified: Secondary | ICD-10-CM | POA: Diagnosis not present

## 2016-09-28 DIAGNOSIS — M1990 Unspecified osteoarthritis, unspecified site: Secondary | ICD-10-CM | POA: Diagnosis not present

## 2016-09-28 DIAGNOSIS — E119 Type 2 diabetes mellitus without complications: Secondary | ICD-10-CM | POA: Diagnosis not present

## 2016-09-28 DIAGNOSIS — I639 Cerebral infarction, unspecified: Secondary | ICD-10-CM | POA: Diagnosis not present

## 2016-09-28 DIAGNOSIS — I10 Essential (primary) hypertension: Secondary | ICD-10-CM | POA: Diagnosis not present

## 2016-09-28 DIAGNOSIS — N3281 Overactive bladder: Secondary | ICD-10-CM | POA: Diagnosis not present

## 2016-09-28 DIAGNOSIS — M62442 Contracture of muscle, left hand: Secondary | ICD-10-CM | POA: Diagnosis not present

## 2016-09-28 DIAGNOSIS — I69354 Hemiplegia and hemiparesis following cerebral infarction affecting left non-dominant side: Secondary | ICD-10-CM | POA: Diagnosis not present

## 2016-09-28 DIAGNOSIS — Z9181 History of falling: Secondary | ICD-10-CM | POA: Diagnosis not present

## 2016-09-29 DIAGNOSIS — I639 Cerebral infarction, unspecified: Secondary | ICD-10-CM | POA: Diagnosis not present

## 2016-09-29 DIAGNOSIS — I69354 Hemiplegia and hemiparesis following cerebral infarction affecting left non-dominant side: Secondary | ICD-10-CM | POA: Diagnosis not present

## 2016-09-29 DIAGNOSIS — Z9181 History of falling: Secondary | ICD-10-CM | POA: Diagnosis not present

## 2016-09-29 DIAGNOSIS — N3281 Overactive bladder: Secondary | ICD-10-CM | POA: Diagnosis not present

## 2016-09-29 DIAGNOSIS — I10 Essential (primary) hypertension: Secondary | ICD-10-CM | POA: Diagnosis not present

## 2016-09-29 DIAGNOSIS — M62442 Contracture of muscle, left hand: Secondary | ICD-10-CM | POA: Diagnosis not present

## 2016-09-29 DIAGNOSIS — E119 Type 2 diabetes mellitus without complications: Secondary | ICD-10-CM | POA: Diagnosis not present

## 2016-09-29 DIAGNOSIS — M1990 Unspecified osteoarthritis, unspecified site: Secondary | ICD-10-CM | POA: Diagnosis not present

## 2016-10-01 DIAGNOSIS — M1990 Unspecified osteoarthritis, unspecified site: Secondary | ICD-10-CM | POA: Diagnosis not present

## 2016-10-01 DIAGNOSIS — Z9181 History of falling: Secondary | ICD-10-CM | POA: Diagnosis not present

## 2016-10-01 DIAGNOSIS — M62442 Contracture of muscle, left hand: Secondary | ICD-10-CM | POA: Diagnosis not present

## 2016-10-01 DIAGNOSIS — I639 Cerebral infarction, unspecified: Secondary | ICD-10-CM | POA: Diagnosis not present

## 2016-10-01 DIAGNOSIS — I10 Essential (primary) hypertension: Secondary | ICD-10-CM | POA: Diagnosis not present

## 2016-10-01 DIAGNOSIS — E119 Type 2 diabetes mellitus without complications: Secondary | ICD-10-CM | POA: Diagnosis not present

## 2016-10-01 DIAGNOSIS — I69354 Hemiplegia and hemiparesis following cerebral infarction affecting left non-dominant side: Secondary | ICD-10-CM | POA: Diagnosis not present

## 2016-10-01 DIAGNOSIS — N3281 Overactive bladder: Secondary | ICD-10-CM | POA: Diagnosis not present

## 2016-10-02 DIAGNOSIS — I69354 Hemiplegia and hemiparesis following cerebral infarction affecting left non-dominant side: Secondary | ICD-10-CM | POA: Diagnosis not present

## 2016-10-02 DIAGNOSIS — N3281 Overactive bladder: Secondary | ICD-10-CM | POA: Diagnosis not present

## 2016-10-02 DIAGNOSIS — Z9181 History of falling: Secondary | ICD-10-CM | POA: Diagnosis not present

## 2016-10-02 DIAGNOSIS — M62442 Contracture of muscle, left hand: Secondary | ICD-10-CM | POA: Diagnosis not present

## 2016-10-02 DIAGNOSIS — I639 Cerebral infarction, unspecified: Secondary | ICD-10-CM | POA: Diagnosis not present

## 2016-10-02 DIAGNOSIS — E119 Type 2 diabetes mellitus without complications: Secondary | ICD-10-CM | POA: Diagnosis not present

## 2016-10-02 DIAGNOSIS — I10 Essential (primary) hypertension: Secondary | ICD-10-CM | POA: Diagnosis not present

## 2016-10-02 DIAGNOSIS — M1990 Unspecified osteoarthritis, unspecified site: Secondary | ICD-10-CM | POA: Diagnosis not present

## 2016-10-04 DIAGNOSIS — N3281 Overactive bladder: Secondary | ICD-10-CM | POA: Diagnosis not present

## 2016-10-04 DIAGNOSIS — I10 Essential (primary) hypertension: Secondary | ICD-10-CM | POA: Diagnosis not present

## 2016-10-04 DIAGNOSIS — E119 Type 2 diabetes mellitus without complications: Secondary | ICD-10-CM | POA: Diagnosis not present

## 2016-10-04 DIAGNOSIS — Z9181 History of falling: Secondary | ICD-10-CM | POA: Diagnosis not present

## 2016-10-04 DIAGNOSIS — M1990 Unspecified osteoarthritis, unspecified site: Secondary | ICD-10-CM | POA: Diagnosis not present

## 2016-10-04 DIAGNOSIS — I639 Cerebral infarction, unspecified: Secondary | ICD-10-CM | POA: Diagnosis not present

## 2016-10-04 DIAGNOSIS — I69354 Hemiplegia and hemiparesis following cerebral infarction affecting left non-dominant side: Secondary | ICD-10-CM | POA: Diagnosis not present

## 2016-10-04 DIAGNOSIS — M62442 Contracture of muscle, left hand: Secondary | ICD-10-CM | POA: Diagnosis not present

## 2016-10-05 DIAGNOSIS — I639 Cerebral infarction, unspecified: Secondary | ICD-10-CM | POA: Diagnosis not present

## 2016-10-05 DIAGNOSIS — M6281 Muscle weakness (generalized): Secondary | ICD-10-CM | POA: Diagnosis not present

## 2016-10-05 DIAGNOSIS — S82843S Displaced bimalleolar fracture of unspecified lower leg, sequela: Secondary | ICD-10-CM | POA: Diagnosis not present

## 2016-10-05 DIAGNOSIS — M1990 Unspecified osteoarthritis, unspecified site: Secondary | ICD-10-CM | POA: Diagnosis not present

## 2016-10-06 DIAGNOSIS — M1990 Unspecified osteoarthritis, unspecified site: Secondary | ICD-10-CM | POA: Diagnosis not present

## 2016-10-06 DIAGNOSIS — I639 Cerebral infarction, unspecified: Secondary | ICD-10-CM | POA: Diagnosis not present

## 2016-10-06 DIAGNOSIS — E119 Type 2 diabetes mellitus without complications: Secondary | ICD-10-CM | POA: Diagnosis not present

## 2016-10-06 DIAGNOSIS — Z9181 History of falling: Secondary | ICD-10-CM | POA: Diagnosis not present

## 2016-10-06 DIAGNOSIS — N3281 Overactive bladder: Secondary | ICD-10-CM | POA: Diagnosis not present

## 2016-10-06 DIAGNOSIS — M62442 Contracture of muscle, left hand: Secondary | ICD-10-CM | POA: Diagnosis not present

## 2016-10-06 DIAGNOSIS — I69354 Hemiplegia and hemiparesis following cerebral infarction affecting left non-dominant side: Secondary | ICD-10-CM | POA: Diagnosis not present

## 2016-10-06 DIAGNOSIS — I10 Essential (primary) hypertension: Secondary | ICD-10-CM | POA: Diagnosis not present

## 2016-10-07 DIAGNOSIS — E119 Type 2 diabetes mellitus without complications: Secondary | ICD-10-CM | POA: Diagnosis not present

## 2016-10-07 DIAGNOSIS — Z9181 History of falling: Secondary | ICD-10-CM | POA: Diagnosis not present

## 2016-10-07 DIAGNOSIS — M62442 Contracture of muscle, left hand: Secondary | ICD-10-CM | POA: Diagnosis not present

## 2016-10-07 DIAGNOSIS — M1990 Unspecified osteoarthritis, unspecified site: Secondary | ICD-10-CM | POA: Diagnosis not present

## 2016-10-07 DIAGNOSIS — I639 Cerebral infarction, unspecified: Secondary | ICD-10-CM | POA: Diagnosis not present

## 2016-10-07 DIAGNOSIS — I69354 Hemiplegia and hemiparesis following cerebral infarction affecting left non-dominant side: Secondary | ICD-10-CM | POA: Diagnosis not present

## 2016-10-07 DIAGNOSIS — I10 Essential (primary) hypertension: Secondary | ICD-10-CM | POA: Diagnosis not present

## 2016-10-07 DIAGNOSIS — N3281 Overactive bladder: Secondary | ICD-10-CM | POA: Diagnosis not present

## 2016-10-08 DIAGNOSIS — I639 Cerebral infarction, unspecified: Secondary | ICD-10-CM | POA: Diagnosis not present

## 2016-10-08 DIAGNOSIS — Z9181 History of falling: Secondary | ICD-10-CM | POA: Diagnosis not present

## 2016-10-08 DIAGNOSIS — I69354 Hemiplegia and hemiparesis following cerebral infarction affecting left non-dominant side: Secondary | ICD-10-CM | POA: Diagnosis not present

## 2016-10-08 DIAGNOSIS — I10 Essential (primary) hypertension: Secondary | ICD-10-CM | POA: Diagnosis not present

## 2016-10-08 DIAGNOSIS — M1990 Unspecified osteoarthritis, unspecified site: Secondary | ICD-10-CM | POA: Diagnosis not present

## 2016-10-08 DIAGNOSIS — M62442 Contracture of muscle, left hand: Secondary | ICD-10-CM | POA: Diagnosis not present

## 2016-10-08 DIAGNOSIS — E119 Type 2 diabetes mellitus without complications: Secondary | ICD-10-CM | POA: Diagnosis not present

## 2016-10-08 DIAGNOSIS — N3281 Overactive bladder: Secondary | ICD-10-CM | POA: Diagnosis not present

## 2016-10-09 DIAGNOSIS — I639 Cerebral infarction, unspecified: Secondary | ICD-10-CM | POA: Diagnosis not present

## 2016-10-09 DIAGNOSIS — E119 Type 2 diabetes mellitus without complications: Secondary | ICD-10-CM | POA: Diagnosis not present

## 2016-10-09 DIAGNOSIS — Z9181 History of falling: Secondary | ICD-10-CM | POA: Diagnosis not present

## 2016-10-09 DIAGNOSIS — I10 Essential (primary) hypertension: Secondary | ICD-10-CM | POA: Diagnosis not present

## 2016-10-09 DIAGNOSIS — M1990 Unspecified osteoarthritis, unspecified site: Secondary | ICD-10-CM | POA: Diagnosis not present

## 2016-10-09 DIAGNOSIS — M62442 Contracture of muscle, left hand: Secondary | ICD-10-CM | POA: Diagnosis not present

## 2016-10-09 DIAGNOSIS — N3281 Overactive bladder: Secondary | ICD-10-CM | POA: Diagnosis not present

## 2016-10-09 DIAGNOSIS — I69354 Hemiplegia and hemiparesis following cerebral infarction affecting left non-dominant side: Secondary | ICD-10-CM | POA: Diagnosis not present

## 2016-10-12 DIAGNOSIS — N3281 Overactive bladder: Secondary | ICD-10-CM | POA: Diagnosis not present

## 2016-10-12 DIAGNOSIS — M1990 Unspecified osteoarthritis, unspecified site: Secondary | ICD-10-CM | POA: Diagnosis not present

## 2016-10-12 DIAGNOSIS — E119 Type 2 diabetes mellitus without complications: Secondary | ICD-10-CM | POA: Diagnosis not present

## 2016-10-12 DIAGNOSIS — I639 Cerebral infarction, unspecified: Secondary | ICD-10-CM | POA: Diagnosis not present

## 2016-10-12 DIAGNOSIS — M62442 Contracture of muscle, left hand: Secondary | ICD-10-CM | POA: Diagnosis not present

## 2016-10-12 DIAGNOSIS — I10 Essential (primary) hypertension: Secondary | ICD-10-CM | POA: Diagnosis not present

## 2016-10-12 DIAGNOSIS — Z9181 History of falling: Secondary | ICD-10-CM | POA: Diagnosis not present

## 2016-10-12 DIAGNOSIS — I69354 Hemiplegia and hemiparesis following cerebral infarction affecting left non-dominant side: Secondary | ICD-10-CM | POA: Diagnosis not present

## 2016-10-13 DIAGNOSIS — I639 Cerebral infarction, unspecified: Secondary | ICD-10-CM | POA: Diagnosis not present

## 2016-10-13 DIAGNOSIS — I10 Essential (primary) hypertension: Secondary | ICD-10-CM | POA: Diagnosis not present

## 2016-10-13 DIAGNOSIS — I69354 Hemiplegia and hemiparesis following cerebral infarction affecting left non-dominant side: Secondary | ICD-10-CM | POA: Diagnosis not present

## 2016-10-13 DIAGNOSIS — Z9181 History of falling: Secondary | ICD-10-CM | POA: Diagnosis not present

## 2016-10-13 DIAGNOSIS — M62442 Contracture of muscle, left hand: Secondary | ICD-10-CM | POA: Diagnosis not present

## 2016-10-13 DIAGNOSIS — M1990 Unspecified osteoarthritis, unspecified site: Secondary | ICD-10-CM | POA: Diagnosis not present

## 2016-10-13 DIAGNOSIS — E119 Type 2 diabetes mellitus without complications: Secondary | ICD-10-CM | POA: Diagnosis not present

## 2016-10-13 DIAGNOSIS — N3281 Overactive bladder: Secondary | ICD-10-CM | POA: Diagnosis not present

## 2016-10-14 DIAGNOSIS — I69354 Hemiplegia and hemiparesis following cerebral infarction affecting left non-dominant side: Secondary | ICD-10-CM | POA: Diagnosis not present

## 2016-10-14 DIAGNOSIS — M1990 Unspecified osteoarthritis, unspecified site: Secondary | ICD-10-CM | POA: Diagnosis not present

## 2016-10-14 DIAGNOSIS — N3281 Overactive bladder: Secondary | ICD-10-CM | POA: Diagnosis not present

## 2016-10-14 DIAGNOSIS — Z9181 History of falling: Secondary | ICD-10-CM | POA: Diagnosis not present

## 2016-10-14 DIAGNOSIS — E119 Type 2 diabetes mellitus without complications: Secondary | ICD-10-CM | POA: Diagnosis not present

## 2016-10-14 DIAGNOSIS — I10 Essential (primary) hypertension: Secondary | ICD-10-CM | POA: Diagnosis not present

## 2016-10-14 DIAGNOSIS — I639 Cerebral infarction, unspecified: Secondary | ICD-10-CM | POA: Diagnosis not present

## 2016-10-14 DIAGNOSIS — M62442 Contracture of muscle, left hand: Secondary | ICD-10-CM | POA: Diagnosis not present

## 2016-10-26 DIAGNOSIS — N3281 Overactive bladder: Secondary | ICD-10-CM | POA: Diagnosis not present

## 2016-10-26 DIAGNOSIS — M1990 Unspecified osteoarthritis, unspecified site: Secondary | ICD-10-CM | POA: Diagnosis not present

## 2016-10-26 DIAGNOSIS — Z9181 History of falling: Secondary | ICD-10-CM | POA: Diagnosis not present

## 2016-10-26 DIAGNOSIS — E119 Type 2 diabetes mellitus without complications: Secondary | ICD-10-CM | POA: Diagnosis not present

## 2016-10-26 DIAGNOSIS — M62442 Contracture of muscle, left hand: Secondary | ICD-10-CM | POA: Diagnosis not present

## 2016-10-26 DIAGNOSIS — I10 Essential (primary) hypertension: Secondary | ICD-10-CM | POA: Diagnosis not present

## 2016-10-26 DIAGNOSIS — I69354 Hemiplegia and hemiparesis following cerebral infarction affecting left non-dominant side: Secondary | ICD-10-CM | POA: Diagnosis not present

## 2016-10-26 DIAGNOSIS — I639 Cerebral infarction, unspecified: Secondary | ICD-10-CM | POA: Diagnosis not present

## 2016-10-28 ENCOUNTER — Encounter: Payer: Self-pay | Admitting: Adult Health

## 2016-10-28 ENCOUNTER — Non-Acute Institutional Stay (SKILLED_NURSING_FACILITY): Payer: Medicare Other | Admitting: Adult Health

## 2016-10-28 DIAGNOSIS — S82852D Displaced trimalleolar fracture of left lower leg, subsequent encounter for closed fracture with routine healing: Secondary | ICD-10-CM | POA: Diagnosis not present

## 2016-10-28 DIAGNOSIS — I6930 Unspecified sequelae of cerebral infarction: Secondary | ICD-10-CM | POA: Diagnosis not present

## 2016-10-28 DIAGNOSIS — Z9889 Other specified postprocedural states: Secondary | ICD-10-CM | POA: Diagnosis not present

## 2016-10-28 DIAGNOSIS — I639 Cerebral infarction, unspecified: Secondary | ICD-10-CM | POA: Diagnosis not present

## 2016-10-28 DIAGNOSIS — I10 Essential (primary) hypertension: Secondary | ICD-10-CM | POA: Diagnosis not present

## 2016-10-28 DIAGNOSIS — Z96651 Presence of right artificial knee joint: Secondary | ICD-10-CM | POA: Diagnosis not present

## 2016-10-28 DIAGNOSIS — F418 Other specified anxiety disorders: Secondary | ICD-10-CM

## 2016-10-28 DIAGNOSIS — F17200 Nicotine dependence, unspecified, uncomplicated: Secondary | ICD-10-CM | POA: Diagnosis not present

## 2016-10-28 NOTE — Progress Notes (Signed)
[facility]    Place of Service:  SNF (31)   CODE STATUS: full code   No Known Allergies  Chief Complaint  Patient presents with  . Medical Management of Chronic Issues    HPI:  She is a long term resident of this facility being seen for the management of her chronic illnesses. Overall her status is stable. She tells me that she is feeling good and has no concerns. She does continue to get out of bed daily. There are no nursing concerns at this time.    Past Medical History:  Diagnosis Date  . 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 Arthritis   . Depression   . Hypertension   . Stroke [facility](H[facility]) 2011   left sided weakness    Past Surgical History:  Procedure Laterality Date  . BRAIN SURGERY  2011   coils inserted  . HARDWARE REMOVAL Left 04/29/2016   Procedure: HARDWARE REMOVAL LEFT ANKLE;  Surgeon: Tarry KosNaiping M Xu, MD;  Location: [facility] OR;  Service: Orthopedics;  Laterality: Left;  . ORIF ANKLE FRACTURE Left 12/24/2015   Procedure: OPEN REDUCTION INTERNAL FIXATION (ORIF) ANKLE FRACTURE;  Surgeon: Tarry KosNaiping M Xu, MD;  Location: [facility] OR;  Service: Orthopedics;  Laterality: Left;  . TOTAL KNEE ARTHROPLASTY    . TOTAL KNEE ARTHROPLASTY Right 04/29/2016   Procedure: RIGHT TOTAL KNEE ARTHROPLASTY;  Surgeon: Tarry KosNaiping M Xu, MD;  Location: [facility];  Service: Orthopedics;  Laterality: Right;  . VULVAR LESION REMOVAL  03/17/2012   Procedure: VULVAR LESION;  Surgeon: Brock Badharles A Harper, MD;  Location: [facility];  Service: Gynecology;  Laterality: N/A;  CO2 Laser Vaporization Of Condyloma    Social History   Social History  . Marital status: Legally Separated    Spouse name: N/A  . Number of children: N/A  . Years of education: N/A   Occupational History  . Not on file.   Social History Main Topics  . Smoking status: Current Every Day Smoker    Packs/day: 1.50    Years: 30.00  . Smokeless tobacco: Never Used  . Alcohol use No  . Drug use: No  . Sexual  activity: Not on file   Other Topics Concern  . Not on file   Social History Narrative  . No narrative on file   Family History  Problem Relation Age of Onset  . Hypertension Mother   . Osteoarthritis Mother   . Cancer Mother       VITAL SIGNS BP 122/68   Pulse 76   Temp 98 F (36.7 C)   Resp (!) 93   Ht 5\' 5"  (1.651 m)   Wt 201 lb (91.2 kg)   BMI 33.45 kg/m   Patient's Medications  New Prescriptions   No medications on file  Previous Medications   ACETAMINOPHEN (TYLENOL) 500 MG TABLET    Take 500 mg by mouth 2 (two) times daily as needed for mild pain.   ASPIRIN 325 MG TABLET    Take 325 mg by mouth daily.   DICLOFENAC SODIUM (VOLTAREN) 1 % GEL    Apply 2 g topically 4 (four) times daily.   ESCITALOPRAM (LEXAPRO) 10 MG TABLET    Take 10 mg by mouth daily.   LORATADINE (CLARITIN) 10 MG TABLET    Take 10 mg by mouth daily.   LOSARTAN-HYDROCHLOROTHIAZIDE (HYZAAR) 100-12.5 MG TABLET    Take 1 tablet by mouth daily.  MELOXICAM (MOBIC) 7.5 MG TABLET    Take 7.5 mg by mouth daily.   METHOCARBAMOL (ROBAXIN) 750 MG TABLET    Take 1 tablet (750 mg total) by mouth 2 (two) times daily as needed for muscle spasms.   MONTELUKAST (SINGULAIR) 10 MG TABLET    Take 10 mg by mouth daily.    MYRBETRIQ 25 MG TB24 TABLET    Take 25 mg by mouth daily.   ONDANSETRON (ZOFRAN) 4 MG TABLET    Take 4 mg by mouth every 8 (eight) hours as needed for nausea or vomiting.   OXYCODONE (OXY IR/ROXICODONE) 5 MG IMMEDIATE RELEASE TABLET    Take 1-3 tablets (5-15 mg total) by mouth every 4 (four) hours as needed.   OXYCODONE (OXYCONTIN) 10 MG 12 HR TABLET    Take 1 tablet (10 mg total) by mouth every 12 (twelve) hours.   POLYETHYLENE GLYCOL (MIRALAX / GLYCOLAX) PACKET    Take 17 g by mouth daily as needed for mild constipation.   SENNA-DOCUSATE (SENOKOT S) 8.6-50 MG TABLET    Take 1 tablet by mouth at bedtime as needed.  Modified Medications   No medications on file  Discontinued Medications   No  medications on file     SIGNIFICANT DIAGNOSTIC EXAMS  04-29-16: right knee x-ray: Status post knee arthroplasty.  No adverse features   04-29-16: left ankle x-ray; Intraoperative images demonstrating ORIF of the ankle. Intraoperative localization.  06-16-16: screening mammogram: No mammographic evidence of malignancy.   LABS REVIEWED:   04-22-16: wbc 8.0; hgb 11.8; hct 37.0; mcv 91.1; plt 410; glucose 91; bun 10; creat 0.87; k+ 4.3; na++ 142; liver normal albumin 3.6; sed rate 76; CRP 3.0 04-29-16: wbc 16.3; hgb 10.1; hct 32.0; mcv 90.7 plt 317 04-30-16: wbc 10.6; hgb 9.5; hct 29.4; mcv 89.8. ;plt 310 05-02-16: wbc 10.5; hgb 9.2; hct 28.9; mcv 89.5; plt 297; glucose 123; bun 8; creat 0.94; k+ 3.9; na++ 140  09-09-16: wbc 5.8; hgb 10.4; hct 32.8; mcv 92.9; plt 281; glucose 77; bun 16.9; creat 0.77; k+ 4.2; na++ 141; liver normal albumin 3.9     Review of Systems  Constitutional: Negative for malaise/fatigue.  Respiratory: Negative for cough and shortness of breath.   Cardiovascular: Negative for chest pain, palpitations and leg swelling.  Gastrointestinal: Negative for abdominal pain, constipation and heartburn.  Musculoskeletal: Negative for back pain, joint pain and myalgias.  Skin: Negative.   Neurological: Negative for dizziness.  Psychiatric/Behavioral: The patient is not nervous/anxious.      Physical Exam  Vitals reviewed. Constitutional: She is oriented to person, place, and time. She appears well-developed and well-nourished. No distress.  Obese   Eyes: Conjunctivae are normal.  Neck: Neck supple. No JVD present. No thyromegaly present.  Cardiovascular: Normal rate, regular rhythm and intact distal pulses.   Respiratory: Effort normal and breath sounds normal. No respiratory distress. She has no wheezes.  GI: Soft. Bowel sounds are normal. She exhibits no distension. There is no tenderness.  Musculoskeletal: She exhibits no edema.  Able to move all extremities  Has left  side weakness  Lymphadenopathy:    She has no cervical adenopathy.  Neurological: She is alert and oriented to person, place, and time.  Skin: Skin is warm and dry. She is not diaphoretic.  Psychiatric: She has a normal mood and affect.     ASSESSMENT/ PLAN:  1. Osteoarthritis: is status post right knee replacement (04-18-2016); will  continue volatern gel 2 gm four times daily will continue  mobic 7.5 mg daily will monitor  2. Left ankle fracture: status hardware removal;(04-29-16)  will continue mobic 7.5 mg daily robaxin 750 mg twice daily as needed    3. Chronic pain: will continue oxycontin 10 mg every 12 hours; will continue oxycodone 5-15 mg every 4 hours as needed for pain; is using voltaer gel 2 gm four times daily; mobic 7.5 mg daily and has robaxin 750 mg twice daily as needed  4. Hypertension: will continue hyzaar 100/12.5 mg daily   5. Depression with anxiety: will continue lexapro 10 mg daily   6. Allergic rhinitis: will continue singulair 10 mg daily   7. Urinary urge incontinence: will continue myrbetriq 25 mg daily   8. Smoker is off nicotine patches; not requesting to smoke   9. Constipation; will continue miralax daily as needed and senna s nightly as needed  10. CVA: with left side weakness: is presently stable will continue asa 325 mg daily and will   monitor her status.      MD is aware of resident's narcotic use and is in agreement with current plan of care. We will attempt to wean resident as apropriate   Synthia Innocenteborah Latifah Padin NP [facility]  Contact [phone] Monday through Friday 8am- 5pm  After hours call [phone]

## 2016-10-29 DIAGNOSIS — Z9181 History of falling: Secondary | ICD-10-CM | POA: Diagnosis not present

## 2016-10-29 DIAGNOSIS — E119 Type 2 diabetes mellitus without complications: Secondary | ICD-10-CM | POA: Diagnosis not present

## 2016-10-29 DIAGNOSIS — N3281 Overactive bladder: Secondary | ICD-10-CM | POA: Diagnosis not present

## 2016-10-29 DIAGNOSIS — I69354 Hemiplegia and hemiparesis following cerebral infarction affecting left non-dominant side: Secondary | ICD-10-CM | POA: Diagnosis not present

## 2016-10-29 DIAGNOSIS — I639 Cerebral infarction, unspecified: Secondary | ICD-10-CM | POA: Diagnosis not present

## 2016-10-29 DIAGNOSIS — I10 Essential (primary) hypertension: Secondary | ICD-10-CM | POA: Diagnosis not present

## 2016-10-29 DIAGNOSIS — M62442 Contracture of muscle, left hand: Secondary | ICD-10-CM | POA: Diagnosis not present

## 2016-10-29 DIAGNOSIS — M1990 Unspecified osteoarthritis, unspecified site: Secondary | ICD-10-CM | POA: Diagnosis not present

## 2016-11-01 DIAGNOSIS — E119 Type 2 diabetes mellitus without complications: Secondary | ICD-10-CM | POA: Diagnosis not present

## 2016-11-01 DIAGNOSIS — I639 Cerebral infarction, unspecified: Secondary | ICD-10-CM | POA: Diagnosis not present

## 2016-11-01 DIAGNOSIS — N3281 Overactive bladder: Secondary | ICD-10-CM | POA: Diagnosis not present

## 2016-11-01 DIAGNOSIS — M62442 Contracture of muscle, left hand: Secondary | ICD-10-CM | POA: Diagnosis not present

## 2016-11-01 DIAGNOSIS — Z9181 History of falling: Secondary | ICD-10-CM | POA: Diagnosis not present

## 2016-11-01 DIAGNOSIS — M1990 Unspecified osteoarthritis, unspecified site: Secondary | ICD-10-CM | POA: Diagnosis not present

## 2016-11-01 DIAGNOSIS — I10 Essential (primary) hypertension: Secondary | ICD-10-CM | POA: Diagnosis not present

## 2016-11-01 DIAGNOSIS — I69354 Hemiplegia and hemiparesis following cerebral infarction affecting left non-dominant side: Secondary | ICD-10-CM | POA: Diagnosis not present

## 2016-11-02 DIAGNOSIS — M1990 Unspecified osteoarthritis, unspecified site: Secondary | ICD-10-CM | POA: Diagnosis not present

## 2016-11-02 DIAGNOSIS — I639 Cerebral infarction, unspecified: Secondary | ICD-10-CM | POA: Diagnosis not present

## 2016-11-02 DIAGNOSIS — I69354 Hemiplegia and hemiparesis following cerebral infarction affecting left non-dominant side: Secondary | ICD-10-CM | POA: Diagnosis not present

## 2016-11-02 DIAGNOSIS — N3281 Overactive bladder: Secondary | ICD-10-CM | POA: Diagnosis not present

## 2016-11-02 DIAGNOSIS — E119 Type 2 diabetes mellitus without complications: Secondary | ICD-10-CM | POA: Diagnosis not present

## 2016-11-02 DIAGNOSIS — I10 Essential (primary) hypertension: Secondary | ICD-10-CM | POA: Diagnosis not present

## 2016-11-02 DIAGNOSIS — M62442 Contracture of muscle, left hand: Secondary | ICD-10-CM | POA: Diagnosis not present

## 2016-11-02 DIAGNOSIS — Z9181 History of falling: Secondary | ICD-10-CM | POA: Diagnosis not present

## 2016-11-03 DIAGNOSIS — N3281 Overactive bladder: Secondary | ICD-10-CM | POA: Diagnosis not present

## 2016-11-03 DIAGNOSIS — M1990 Unspecified osteoarthritis, unspecified site: Secondary | ICD-10-CM | POA: Diagnosis not present

## 2016-11-03 DIAGNOSIS — E119 Type 2 diabetes mellitus without complications: Secondary | ICD-10-CM | POA: Diagnosis not present

## 2016-11-03 DIAGNOSIS — M62442 Contracture of muscle, left hand: Secondary | ICD-10-CM | POA: Diagnosis not present

## 2016-11-03 DIAGNOSIS — I10 Essential (primary) hypertension: Secondary | ICD-10-CM | POA: Diagnosis not present

## 2016-11-03 DIAGNOSIS — Z9181 History of falling: Secondary | ICD-10-CM | POA: Diagnosis not present

## 2016-11-03 DIAGNOSIS — I69354 Hemiplegia and hemiparesis following cerebral infarction affecting left non-dominant side: Secondary | ICD-10-CM | POA: Diagnosis not present

## 2016-11-03 DIAGNOSIS — I639 Cerebral infarction, unspecified: Secondary | ICD-10-CM | POA: Diagnosis not present

## 2016-11-04 DIAGNOSIS — Z9181 History of falling: Secondary | ICD-10-CM | POA: Diagnosis not present

## 2016-11-04 DIAGNOSIS — S82843S Displaced bimalleolar fracture of unspecified lower leg, sequela: Secondary | ICD-10-CM | POA: Diagnosis not present

## 2016-11-04 DIAGNOSIS — N3281 Overactive bladder: Secondary | ICD-10-CM | POA: Diagnosis not present

## 2016-11-04 DIAGNOSIS — I10 Essential (primary) hypertension: Secondary | ICD-10-CM | POA: Diagnosis not present

## 2016-11-04 DIAGNOSIS — I639 Cerebral infarction, unspecified: Secondary | ICD-10-CM | POA: Diagnosis not present

## 2016-11-04 DIAGNOSIS — M1990 Unspecified osteoarthritis, unspecified site: Secondary | ICD-10-CM | POA: Diagnosis not present

## 2016-11-04 DIAGNOSIS — E119 Type 2 diabetes mellitus without complications: Secondary | ICD-10-CM | POA: Diagnosis not present

## 2016-11-04 DIAGNOSIS — I69354 Hemiplegia and hemiparesis following cerebral infarction affecting left non-dominant side: Secondary | ICD-10-CM | POA: Diagnosis not present

## 2016-11-04 DIAGNOSIS — M6281 Muscle weakness (generalized): Secondary | ICD-10-CM | POA: Diagnosis not present

## 2016-11-04 DIAGNOSIS — M62442 Contracture of muscle, left hand: Secondary | ICD-10-CM | POA: Diagnosis not present

## 2016-11-05 DIAGNOSIS — I10 Essential (primary) hypertension: Secondary | ICD-10-CM | POA: Diagnosis not present

## 2016-11-05 DIAGNOSIS — Z9181 History of falling: Secondary | ICD-10-CM | POA: Diagnosis not present

## 2016-11-05 DIAGNOSIS — E119 Type 2 diabetes mellitus without complications: Secondary | ICD-10-CM | POA: Diagnosis not present

## 2016-11-05 DIAGNOSIS — I639 Cerebral infarction, unspecified: Secondary | ICD-10-CM | POA: Diagnosis not present

## 2016-11-05 DIAGNOSIS — I69354 Hemiplegia and hemiparesis following cerebral infarction affecting left non-dominant side: Secondary | ICD-10-CM | POA: Diagnosis not present

## 2016-11-05 DIAGNOSIS — N3281 Overactive bladder: Secondary | ICD-10-CM | POA: Diagnosis not present

## 2016-11-05 DIAGNOSIS — M1990 Unspecified osteoarthritis, unspecified site: Secondary | ICD-10-CM | POA: Diagnosis not present

## 2016-11-05 DIAGNOSIS — M62442 Contracture of muscle, left hand: Secondary | ICD-10-CM | POA: Diagnosis not present

## 2016-11-08 DIAGNOSIS — I639 Cerebral infarction, unspecified: Secondary | ICD-10-CM | POA: Diagnosis not present

## 2016-11-08 DIAGNOSIS — M62442 Contracture of muscle, left hand: Secondary | ICD-10-CM | POA: Diagnosis not present

## 2016-11-08 DIAGNOSIS — I10 Essential (primary) hypertension: Secondary | ICD-10-CM | POA: Diagnosis not present

## 2016-11-08 DIAGNOSIS — Z9181 History of falling: Secondary | ICD-10-CM | POA: Diagnosis not present

## 2016-11-08 DIAGNOSIS — E119 Type 2 diabetes mellitus without complications: Secondary | ICD-10-CM | POA: Diagnosis not present

## 2016-11-08 DIAGNOSIS — N3281 Overactive bladder: Secondary | ICD-10-CM | POA: Diagnosis not present

## 2016-11-08 DIAGNOSIS — I69354 Hemiplegia and hemiparesis following cerebral infarction affecting left non-dominant side: Secondary | ICD-10-CM | POA: Diagnosis not present

## 2016-11-08 DIAGNOSIS — M1990 Unspecified osteoarthritis, unspecified site: Secondary | ICD-10-CM | POA: Diagnosis not present

## 2016-11-09 DIAGNOSIS — M62442 Contracture of muscle, left hand: Secondary | ICD-10-CM | POA: Diagnosis not present

## 2016-11-09 DIAGNOSIS — I639 Cerebral infarction, unspecified: Secondary | ICD-10-CM | POA: Diagnosis not present

## 2016-11-09 DIAGNOSIS — M1990 Unspecified osteoarthritis, unspecified site: Secondary | ICD-10-CM | POA: Diagnosis not present

## 2016-11-09 DIAGNOSIS — I69354 Hemiplegia and hemiparesis following cerebral infarction affecting left non-dominant side: Secondary | ICD-10-CM | POA: Diagnosis not present

## 2016-11-09 DIAGNOSIS — N3281 Overactive bladder: Secondary | ICD-10-CM | POA: Diagnosis not present

## 2016-11-09 DIAGNOSIS — Z9181 History of falling: Secondary | ICD-10-CM | POA: Diagnosis not present

## 2016-11-09 DIAGNOSIS — E119 Type 2 diabetes mellitus without complications: Secondary | ICD-10-CM | POA: Diagnosis not present

## 2016-11-09 DIAGNOSIS — I10 Essential (primary) hypertension: Secondary | ICD-10-CM | POA: Diagnosis not present

## 2016-11-10 DIAGNOSIS — I639 Cerebral infarction, unspecified: Secondary | ICD-10-CM | POA: Diagnosis not present

## 2016-11-10 DIAGNOSIS — E119 Type 2 diabetes mellitus without complications: Secondary | ICD-10-CM | POA: Diagnosis not present

## 2016-11-10 DIAGNOSIS — M1990 Unspecified osteoarthritis, unspecified site: Secondary | ICD-10-CM | POA: Diagnosis not present

## 2016-11-10 DIAGNOSIS — M62442 Contracture of muscle, left hand: Secondary | ICD-10-CM | POA: Diagnosis not present

## 2016-11-10 DIAGNOSIS — N3281 Overactive bladder: Secondary | ICD-10-CM | POA: Diagnosis not present

## 2016-11-10 DIAGNOSIS — I69354 Hemiplegia and hemiparesis following cerebral infarction affecting left non-dominant side: Secondary | ICD-10-CM | POA: Diagnosis not present

## 2016-11-10 DIAGNOSIS — Z9181 History of falling: Secondary | ICD-10-CM | POA: Diagnosis not present

## 2016-11-10 DIAGNOSIS — I10 Essential (primary) hypertension: Secondary | ICD-10-CM | POA: Diagnosis not present

## 2016-11-11 DIAGNOSIS — M62442 Contracture of muscle, left hand: Secondary | ICD-10-CM | POA: Diagnosis not present

## 2016-11-11 DIAGNOSIS — Z9181 History of falling: Secondary | ICD-10-CM | POA: Diagnosis not present

## 2016-11-11 DIAGNOSIS — I10 Essential (primary) hypertension: Secondary | ICD-10-CM | POA: Diagnosis not present

## 2016-11-11 DIAGNOSIS — N3281 Overactive bladder: Secondary | ICD-10-CM | POA: Diagnosis not present

## 2016-11-11 DIAGNOSIS — E119 Type 2 diabetes mellitus without complications: Secondary | ICD-10-CM | POA: Diagnosis not present

## 2016-11-11 DIAGNOSIS — M1990 Unspecified osteoarthritis, unspecified site: Secondary | ICD-10-CM | POA: Diagnosis not present

## 2016-11-11 DIAGNOSIS — I69354 Hemiplegia and hemiparesis following cerebral infarction affecting left non-dominant side: Secondary | ICD-10-CM | POA: Diagnosis not present

## 2016-11-11 DIAGNOSIS — I639 Cerebral infarction, unspecified: Secondary | ICD-10-CM | POA: Diagnosis not present

## 2016-11-12 DIAGNOSIS — M62442 Contracture of muscle, left hand: Secondary | ICD-10-CM | POA: Diagnosis not present

## 2016-11-12 DIAGNOSIS — N3281 Overactive bladder: Secondary | ICD-10-CM | POA: Diagnosis not present

## 2016-11-12 DIAGNOSIS — I639 Cerebral infarction, unspecified: Secondary | ICD-10-CM | POA: Diagnosis not present

## 2016-11-12 DIAGNOSIS — I10 Essential (primary) hypertension: Secondary | ICD-10-CM | POA: Diagnosis not present

## 2016-11-12 DIAGNOSIS — M1990 Unspecified osteoarthritis, unspecified site: Secondary | ICD-10-CM | POA: Diagnosis not present

## 2016-11-12 DIAGNOSIS — E119 Type 2 diabetes mellitus without complications: Secondary | ICD-10-CM | POA: Diagnosis not present

## 2016-11-12 DIAGNOSIS — Z9181 History of falling: Secondary | ICD-10-CM | POA: Diagnosis not present

## 2016-11-12 DIAGNOSIS — I69354 Hemiplegia and hemiparesis following cerebral infarction affecting left non-dominant side: Secondary | ICD-10-CM | POA: Diagnosis not present

## 2016-11-18 DIAGNOSIS — Z79899 Other long term (current) drug therapy: Secondary | ICD-10-CM | POA: Diagnosis not present

## 2016-12-05 DIAGNOSIS — S82843S Displaced bimalleolar fracture of unspecified lower leg, sequela: Secondary | ICD-10-CM | POA: Diagnosis not present

## 2016-12-05 DIAGNOSIS — M6281 Muscle weakness (generalized): Secondary | ICD-10-CM | POA: Diagnosis not present

## 2016-12-05 DIAGNOSIS — I639 Cerebral infarction, unspecified: Secondary | ICD-10-CM | POA: Diagnosis not present

## 2016-12-05 DIAGNOSIS — M1990 Unspecified osteoarthritis, unspecified site: Secondary | ICD-10-CM | POA: Diagnosis not present

## 2016-12-09 ENCOUNTER — Non-Acute Institutional Stay (SKILLED_NURSING_FACILITY): Payer: Medicare Other | Admitting: Adult Health

## 2016-12-09 DIAGNOSIS — I10 Essential (primary) hypertension: Secondary | ICD-10-CM | POA: Diagnosis not present

## 2016-12-09 DIAGNOSIS — R531 Weakness: Secondary | ICD-10-CM | POA: Diagnosis not present

## 2016-12-09 DIAGNOSIS — S82852D Displaced trimalleolar fracture of left lower leg, subsequent encounter for closed fracture with routine healing: Secondary | ICD-10-CM | POA: Diagnosis not present

## 2016-12-09 DIAGNOSIS — I639 Cerebral infarction, unspecified: Secondary | ICD-10-CM | POA: Diagnosis not present

## 2016-12-09 DIAGNOSIS — J309 Allergic rhinitis, unspecified: Secondary | ICD-10-CM | POA: Diagnosis not present

## 2016-12-09 DIAGNOSIS — F418 Other specified anxiety disorders: Secondary | ICD-10-CM

## 2016-12-09 DIAGNOSIS — I6930 Unspecified sequelae of cerebral infarction: Secondary | ICD-10-CM

## 2016-12-09 DIAGNOSIS — M170 Bilateral primary osteoarthritis of knee: Secondary | ICD-10-CM

## 2016-12-09 DIAGNOSIS — G894 Chronic pain syndrome: Secondary | ICD-10-CM

## 2016-12-09 DIAGNOSIS — Z9889 Other specified postprocedural states: Secondary | ICD-10-CM

## 2016-12-20 DIAGNOSIS — I69354 Hemiplegia and hemiparesis following cerebral infarction affecting left non-dominant side: Secondary | ICD-10-CM | POA: Diagnosis not present

## 2016-12-20 DIAGNOSIS — M62442 Contracture of muscle, left hand: Secondary | ICD-10-CM | POA: Diagnosis not present

## 2016-12-20 DIAGNOSIS — E119 Type 2 diabetes mellitus without complications: Secondary | ICD-10-CM | POA: Diagnosis not present

## 2016-12-21 DIAGNOSIS — E119 Type 2 diabetes mellitus without complications: Secondary | ICD-10-CM | POA: Diagnosis not present

## 2016-12-21 DIAGNOSIS — I69354 Hemiplegia and hemiparesis following cerebral infarction affecting left non-dominant side: Secondary | ICD-10-CM | POA: Diagnosis not present

## 2016-12-21 DIAGNOSIS — M62442 Contracture of muscle, left hand: Secondary | ICD-10-CM | POA: Diagnosis not present

## 2016-12-22 DIAGNOSIS — E119 Type 2 diabetes mellitus without complications: Secondary | ICD-10-CM | POA: Diagnosis not present

## 2016-12-22 DIAGNOSIS — I69354 Hemiplegia and hemiparesis following cerebral infarction affecting left non-dominant side: Secondary | ICD-10-CM | POA: Diagnosis not present

## 2016-12-22 DIAGNOSIS — M62442 Contracture of muscle, left hand: Secondary | ICD-10-CM | POA: Diagnosis not present

## 2016-12-23 DIAGNOSIS — I69354 Hemiplegia and hemiparesis following cerebral infarction affecting left non-dominant side: Secondary | ICD-10-CM | POA: Diagnosis not present

## 2016-12-23 DIAGNOSIS — M62442 Contracture of muscle, left hand: Secondary | ICD-10-CM | POA: Diagnosis not present

## 2016-12-23 DIAGNOSIS — E119 Type 2 diabetes mellitus without complications: Secondary | ICD-10-CM | POA: Diagnosis not present

## 2016-12-24 DIAGNOSIS — E119 Type 2 diabetes mellitus without complications: Secondary | ICD-10-CM | POA: Diagnosis not present

## 2016-12-24 DIAGNOSIS — M62442 Contracture of muscle, left hand: Secondary | ICD-10-CM | POA: Diagnosis not present

## 2016-12-24 DIAGNOSIS — I69354 Hemiplegia and hemiparesis following cerebral infarction affecting left non-dominant side: Secondary | ICD-10-CM | POA: Diagnosis not present

## 2016-12-27 DIAGNOSIS — I69354 Hemiplegia and hemiparesis following cerebral infarction affecting left non-dominant side: Secondary | ICD-10-CM | POA: Diagnosis not present

## 2016-12-27 DIAGNOSIS — M62442 Contracture of muscle, left hand: Secondary | ICD-10-CM | POA: Diagnosis not present

## 2016-12-27 DIAGNOSIS — E119 Type 2 diabetes mellitus without complications: Secondary | ICD-10-CM | POA: Diagnosis not present

## 2016-12-28 DIAGNOSIS — I69354 Hemiplegia and hemiparesis following cerebral infarction affecting left non-dominant side: Secondary | ICD-10-CM | POA: Diagnosis not present

## 2016-12-28 DIAGNOSIS — M62442 Contracture of muscle, left hand: Secondary | ICD-10-CM | POA: Diagnosis not present

## 2016-12-28 DIAGNOSIS — E119 Type 2 diabetes mellitus without complications: Secondary | ICD-10-CM | POA: Diagnosis not present

## 2016-12-29 DIAGNOSIS — I69354 Hemiplegia and hemiparesis following cerebral infarction affecting left non-dominant side: Secondary | ICD-10-CM | POA: Diagnosis not present

## 2016-12-29 DIAGNOSIS — E119 Type 2 diabetes mellitus without complications: Secondary | ICD-10-CM | POA: Diagnosis not present

## 2016-12-29 DIAGNOSIS — M62442 Contracture of muscle, left hand: Secondary | ICD-10-CM | POA: Diagnosis not present

## 2016-12-30 DIAGNOSIS — E119 Type 2 diabetes mellitus without complications: Secondary | ICD-10-CM | POA: Diagnosis not present

## 2016-12-30 DIAGNOSIS — I69354 Hemiplegia and hemiparesis following cerebral infarction affecting left non-dominant side: Secondary | ICD-10-CM | POA: Diagnosis not present

## 2016-12-30 DIAGNOSIS — M62442 Contracture of muscle, left hand: Secondary | ICD-10-CM | POA: Diagnosis not present

## 2016-12-31 DIAGNOSIS — I69354 Hemiplegia and hemiparesis following cerebral infarction affecting left non-dominant side: Secondary | ICD-10-CM | POA: Diagnosis not present

## 2016-12-31 DIAGNOSIS — E119 Type 2 diabetes mellitus without complications: Secondary | ICD-10-CM | POA: Diagnosis not present

## 2016-12-31 DIAGNOSIS — M62442 Contracture of muscle, left hand: Secondary | ICD-10-CM | POA: Diagnosis not present

## 2017-01-02 DIAGNOSIS — E119 Type 2 diabetes mellitus without complications: Secondary | ICD-10-CM | POA: Diagnosis not present

## 2017-01-02 DIAGNOSIS — I69354 Hemiplegia and hemiparesis following cerebral infarction affecting left non-dominant side: Secondary | ICD-10-CM | POA: Diagnosis not present

## 2017-01-02 DIAGNOSIS — M62442 Contracture of muscle, left hand: Secondary | ICD-10-CM | POA: Diagnosis not present

## 2017-01-03 DIAGNOSIS — E119 Type 2 diabetes mellitus without complications: Secondary | ICD-10-CM | POA: Diagnosis not present

## 2017-01-03 DIAGNOSIS — M62442 Contracture of muscle, left hand: Secondary | ICD-10-CM | POA: Diagnosis not present

## 2017-01-03 DIAGNOSIS — I69354 Hemiplegia and hemiparesis following cerebral infarction affecting left non-dominant side: Secondary | ICD-10-CM | POA: Diagnosis not present

## 2017-01-04 DIAGNOSIS — I69354 Hemiplegia and hemiparesis following cerebral infarction affecting left non-dominant side: Secondary | ICD-10-CM | POA: Diagnosis not present

## 2017-01-04 DIAGNOSIS — M62442 Contracture of muscle, left hand: Secondary | ICD-10-CM | POA: Diagnosis not present

## 2017-01-04 DIAGNOSIS — E119 Type 2 diabetes mellitus without complications: Secondary | ICD-10-CM | POA: Diagnosis not present

## 2017-01-05 DIAGNOSIS — I69354 Hemiplegia and hemiparesis following cerebral infarction affecting left non-dominant side: Secondary | ICD-10-CM | POA: Diagnosis not present

## 2017-01-05 DIAGNOSIS — M62442 Contracture of muscle, left hand: Secondary | ICD-10-CM | POA: Diagnosis not present

## 2017-01-05 DIAGNOSIS — M1990 Unspecified osteoarthritis, unspecified site: Secondary | ICD-10-CM | POA: Diagnosis not present

## 2017-01-05 DIAGNOSIS — E119 Type 2 diabetes mellitus without complications: Secondary | ICD-10-CM | POA: Diagnosis not present

## 2017-01-05 DIAGNOSIS — S82843S Displaced bimalleolar fracture of unspecified lower leg, sequela: Secondary | ICD-10-CM | POA: Diagnosis not present

## 2017-01-05 DIAGNOSIS — M6281 Muscle weakness (generalized): Secondary | ICD-10-CM | POA: Diagnosis not present

## 2017-01-05 DIAGNOSIS — I639 Cerebral infarction, unspecified: Secondary | ICD-10-CM | POA: Diagnosis not present

## 2017-01-09 ENCOUNTER — Encounter: Payer: Self-pay | Admitting: Adult Health

## 2017-01-09 NOTE — Progress Notes (Signed)
Location:   Database administrator of Service:  SNF (31)   CODE STATUS: full code   No Known Allergies  Chief Complaint  Patient presents with  . Medical Management of Chronic Issues    HPI:  She is a long term resident of this facility being seen for the management of her chronic illnesses. Overall her status is without change.  She does spend most of her time in bed per her choice. She is not voicing any complaints at this time. There are no nursing concerns at this time.    Past Medical History:  Diagnosis Date  . 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 Arthritis   . Depression   . Hypertension   . Stroke [facility]) 2011   left sided weakness    Past Surgical History:  Procedure Laterality Date  . BRAIN SURGERY  2011   coils inserted  . HARDWARE REMOVAL Left 04/29/2016   Procedure: HARDWARE REMOVAL LEFT ANKLE;  Surgeon: Tarry Kos, MD;  Location: [facility] OR;  Service: Orthopedics;  Laterality: Left;  . ORIF ANKLE FRACTURE Left 12/24/2015   Procedure: OPEN REDUCTION INTERNAL FIXATION (ORIF) ANKLE FRACTURE;  Surgeon: Tarry Kos, MD;  Location: [facility] OR;  Service: Orthopedics;  Laterality: Left;  . TOTAL KNEE ARTHROPLASTY    . TOTAL KNEE ARTHROPLASTY Right 04/29/2016   Procedure: RIGHT TOTAL KNEE ARTHROPLASTY;  Surgeon: Tarry Kos, MD;  Location: [facility] OR;  Service: Orthopedics;  Laterality: Right;  . VULVAR LESION REMOVAL  03/17/2012   Procedure: VULVAR LESION;  Surgeon: Brock Bad, MD;  Location: [facility] ORS;  Service: Gynecology;  Laterality: N/A;  CO2 Laser Vaporization Of Condyloma    Social History   Social History  . Marital status: Legally Separated    Spouse name: N/A  . Number of children: N/A  . Years of education: N/A   Occupational History  . Not on file.   Social History Main Topics  . Smoking status: Current Every Day Smoker    Packs/day: 1.50    Years: 30.00  . Smokeless tobacco: Never Used  . Alcohol use No  . Drug use: No  .  Sexual activity: Not on file   Other Topics Concern  . Not on file   Social History Narrative  . No narrative on file   Family History  Problem Relation Age of Onset  . Hypertension Mother   . Osteoarthritis Mother   . Cancer Mother       VITAL SIGNS BP (!) 144/78   Pulse 78   Temp 98.1 F (36.7 C)   Resp 18   Ht 5\' 5"  (1.651 m)   Wt 201 lb 8 oz (91.4 kg)   SpO2 95%   BMI 33.53 kg/m   Patient's Medications  New Prescriptions   No medications on file  Previous Medications   ACETAMINOPHEN (TYLENOL) 500 MG TABLET    Take 500 mg by mouth 2 (two) times daily as needed for mild pain.   ASPIRIN 325 MG TABLET    Take 325 mg by mouth daily.   DICLOFENAC SODIUM (VOLTAREN) 1 % GEL    Apply 2 g topically 4 (four) times daily.   ESCITALOPRAM (LEXAPRO) 10 MG TABLET    Take 10 mg by mouth daily.   LORATADINE (CLARITIN) 10 MG TABLET    Take 10 mg by mouth daily.   LOSARTAN-HYDROCHLOROTHIAZIDE (HYZAAR) 100-12.5 MG TABLET  Take 1 tablet by mouth daily.   MELOXICAM (MOBIC) 7.5 MG TABLET    Take 7.5 mg by mouth daily.   METHOCARBAMOL (ROBAXIN) 750 MG TABLET    Take 1 tablet (750 mg total) by mouth 2 (two) times daily as needed for muscle spasms.   MONTELUKAST (SINGULAIR) 10 MG TABLET    Take 10 mg by mouth daily.    MYRBETRIQ 25 MG TB24 TABLET    Take 25 mg by mouth daily.   ONDANSETRON (ZOFRAN) 4 MG TABLET    Take 4 mg by mouth every 8 (eight) hours as needed for nausea or vomiting.   OXYCODONE (OXY IR/ROXICODONE) 5 MG IMMEDIATE RELEASE TABLET    Take 1-3 tablets (5-15 mg total) by mouth every 4 (four) hours as needed.   OXYCODONE (OXYCONTIN) 10 MG 12 HR TABLET    Take 1 tablet (10 mg total) by mouth every 12 (twelve) hours.   POLYETHYLENE GLYCOL (MIRALAX / GLYCOLAX) PACKET    Take 17 g by mouth daily as needed for mild constipation.   SENNA-DOCUSATE (SENOKOT S) 8.6-50 MG TABLET    Take 1 tablet by mouth at bedtime as needed.  Modified Medications   No medications on file    Discontinued Medications   No medications on file     SIGNIFICANT DIAGNOSTIC EXAMS  04-29-16: right knee x-ray: Status post knee arthroplasty.  No adverse features   04-29-16: left ankle x-ray; Intraoperative images demonstrating ORIF of the ankle. Intraoperative localization.  06-16-16: screening mammogram: No mammographic evidence of malignancy.   LABS REVIEWED:   04-22-16: wbc 8.0; hgb 11.8; hct 37.0; mcv 91.1; plt 410; glucose 91; bun 10; creat 0.87; k+ 4.3; na++ 142; liver normal albumin 3.6; sed rate 76; CRP 3.0 04-29-16: wbc 16.3; hgb 10.1; hct 32.0; mcv 90.7 plt 317 04-30-16: wbc 10.6; hgb 9.5; hct 29.4; mcv 89.8. ;plt 310 05-02-16: wbc 10.5; hgb 9.2; hct 28.9; mcv 89.5; plt 297; glucose 123; bun 8; creat 0.94; k+ 3.9; na++ 140  09-09-16: wbc 5.8; hgb 10.4; hct 32.8; mcv 92.9; plt 281; glucose 77; bun 16.9; creat 0.77; k+ 4.2; na++ 141; liver normal albumin 3.9     Review of Systems  Constitutional: Negative for malaise/fatigue.  Respiratory: Negative for cough and shortness of breath.   Cardiovascular: Negative for chest pain, palpitations and leg swelling.  Gastrointestinal: Negative for abdominal pain, constipation and heartburn.  Musculoskeletal: Negative for back pain, joint pain and myalgias.  Skin: Negative.   Neurological: Negative for dizziness.  Psychiatric/Behavioral: The patient is not nervous/anxious.      Physical Exam  Vitals reviewed. Constitutional: She is oriented to person, place, and time. She appears well-developed and well-nourished. No distress.  Obese   Eyes: Conjunctivae are normal.  Neck: Neck supple. No JVD present. No thyromegaly present.  Cardiovascular: Normal rate, regular rhythm and intact distal pulses.   Respiratory: Effort normal and breath sounds normal. No respiratory distress. She has no wheezes.  GI: Soft. Bowel sounds are normal. She exhibits no distension. There is no tenderness.  Musculoskeletal: She exhibits no edema.  Able to  move all extremities  Has left side weakness  Lymphadenopathy:    She has no cervical adenopathy.  Neurological: She is alert and oriented to person, place, and time.  Skin: Skin is warm and dry. She is not diaphoretic.  Psychiatric: She has a normal mood and affect.     ASSESSMENT/ PLAN:  1. Osteoarthritis: is status post right knee replacement (04-18-2016); will  continue  volatern gel 2 gm four times daily will continue mobic 7.5 mg daily will monitor  2. Left ankle fracture: status hardware removal;(04-29-16)  will continue mobic 7.5 mg daily robaxin 750 mg twice daily as needed    3. Chronic pain: will continue oxycontin 10 mg every 12 hours; will continue oxycodone 5-15 mg every 4 hours as needed for pain; is using voltaer gel 2 gm four times daily; mobic 7.5 mg daily and has robaxin 750 mg twice daily as needed  4. Hypertension: will continue hyzaar 100/12.5 mg daily   5. Depression with anxiety: will continue lexapro 10 mg daily   6. Allergic rhinitis: will continue singulair 10 mg daily   7. Urinary urge incontinence: will continue myrbetriq 25 mg daily   8. Smoker is off nicotine patches; not requesting to smoke   9. Constipation; will continue miralax daily as needed and senna s nightly as needed  10. CVA: with left side weakness: is presently stable will continue asa 325 mg daily and will   monitor her status.   11. Hypertension: is currently not on medications will monitor     MD is aware of resident's narcotic use and is in agreement with current plan of care. We will attempt to wean resident as apropriate   Synthia Innocenteborah Shaquasia Caponigro NP [facility]  Contact [phone] Monday through Friday 8am- 5pm  After hours call [phone]

## 2017-03-09 ENCOUNTER — Encounter: Payer: Self-pay | Admitting: Internal Medicine

## 2017-03-09 ENCOUNTER — Non-Acute Institutional Stay (SKILLED_NURSING_FACILITY): Payer: Medicare Other | Admitting: Internal Medicine

## 2017-03-09 DIAGNOSIS — D649 Anemia, unspecified: Secondary | ICD-10-CM

## 2017-03-09 DIAGNOSIS — M79604 Pain in right leg: Secondary | ICD-10-CM

## 2017-03-09 DIAGNOSIS — M79605 Pain in left leg: Secondary | ICD-10-CM

## 2017-03-09 DIAGNOSIS — R109 Unspecified abdominal pain: Secondary | ICD-10-CM

## 2017-03-09 DIAGNOSIS — Z9189 Other specified personal risk factors, not elsewhere classified: Secondary | ICD-10-CM

## 2017-03-09 DIAGNOSIS — I10 Essential (primary) hypertension: Secondary | ICD-10-CM | POA: Diagnosis not present

## 2017-03-09 NOTE — Assessment & Plan Note (Addendum)
Recheck CBC if her GI symptoms persist with med changes

## 2017-03-09 NOTE — Progress Notes (Signed)
Patient ID: Jamie Haas, female   DOB: August 16, 1946, 71 y.o.   MRN: [id number]   This is a nursing facility follow up of chronic medical diagnoses  Interim medical record and care since last [facility] visit was updated with review of diagnostic studies and change in clinical status since last visit were documented.  HPI: The patient has been a resident since 05/27/16 . Active diagnoses include hypertension, cerebrovascular accident (2011), extrinsic rhinitis, chronic pain syndrome, osteoarthritis, depression with anxiety, and urinary urge incontinence. Intracranial aneurysm was coiled in 2011. The patient is on multiple meds for her allergies including Singulair ,Claritin, and hydroxyzine at bedtime. Stroke prophylaxis is ASA 325 mg daily and the dual antihypertensive losartan/HCT Chronic pain is treated with oxycodone 10 mg every 12 hours Bladder issues are treated with Myrbetriq 25 mg daily. She has topical nonsteroidal as well as oral meloxicam for osteoarthritis. Last labs on record were 09/09/16. Renal function was normal. Normochromic normocytic anemia was present with hemoglobin 10.4 & hematocrit 33. As noted the patient is on meloxicam and full dose aspirin. [employer] saw the patient in follow-up 03/03/17 ;depression was thought to be worsening manifested as patient staying her room for most of the time & not participating in SNF activities. Patient was noted to be tearful at that time. Lexapro was increased to 20 mg daily. Hydroxyzine 25 mg at bedtime was added. Labs completed 01/31/17 revealed glucose of 122. Hepatorenal function was normal. Hemoglobin had improved to 10.7. LDL was 98 and HDL 42, slightly low. A1c had been ordered but never documented.  Review of systems: Review of systems is valid but the patient was unable to remember the president's name initially but did recall it later. She did get the correct date. She complains of intermittent abdominal discomfort  mainly at night as cramping. She also has occasional dysphagia. She will have abdominal pain when constipated. She has some anxiety and depression. She describes diffuse pain in her legs involving both legs and not limited to joints. This is described as throbbing.  Constitutional: No fever,significant weight change, fatigue  Eyes: No redness, discharge, pain, vision change ENT/mouth: No nasal congestion,  purulent discharge, earache,change in hearing ,sore throat  Cardiovascular: No chest pain, palpitations,paroxysmal nocturnal dyspnea, claudication, edema  Respiratory: No cough, sputum production,hemoptysis, DOE , significant snoring,apnea  Gastrointestinal: No heartburn, nausea / vomiting,rectal bleeding, melena Genitourinary: No dysuria,hematuria, pyuria,  incontinence, nocturia Dermatologic: No rash, pruritus, change in appearance of skin Neurologic: No dizziness,headache,syncope, seizures, numbness , tingling Endocrine: No change in hair/skin/ nails, excessive thirst, excessive hunger, excessive urination  Hematologic/lymphatic: No significant bruising, lymphadenopathy,abnormal bleeding Allergy/immunology: No itchy/ watery eyes, significant sneezing, urticaria, angioedema  Physical exam:  Pertinent or positive findings:She is somewhat lethargic and affect is flat. Responses were correct but slow. Patient is edentulous. There is a small punctate lesion in the left external nare. She has a grade 1/2 systolic murmur. Abdomen is protuberant. Pedal pulses are decreased. She has marked decreased strength and range of motion in the left upper and left lower extremities. Intermittent R hand tremor.  General appearance:Adequately nourished; no acute distress , increased work of breathing is present.   Lymphatic: No lymphadenopathy about the head, neck, axilla . Eyes: No conjunctival inflammation or lid edema is present. There is no scleral icterus. Ears:  External ear exam shows no significant  lesions or deformities.   Nose:  External nasal examination shows no deformity or inflammation. Nasal mucosa are pink and moist without lesions ,exudates Oral  exam: lips and gums are healthy appearing.There is no oropharyngeal erythema or exudate . Neck:  No thyromegaly, masses, tenderness noted.    Heart:  Normal rate and regular rhythm. S1 and S2 normal without gallop,click, rub .  Lungs:Chest clear to auscultation without wheezes, rhonchi,rales , rubs. Abdomen:Bowel sounds are normal. Abdomen is soft and nontender with no organomegaly, hernias,masses. GU: deferred  Extremities:  No cyanosis, clubbing,edema  Neurologic exam : Balance,Rhomberg,finger to nose testing could not be completed due to clinical state Skin: Warm & dry w/o tenting. No significant lesions or rash.  See summary under each active problem in the Problem List with associated updated therapeutic plan as well as for each new diagnosis @ this visit

## 2017-03-09 NOTE — Patient Instructions (Signed)
See assessment and plan under each diagnosis in the problem list and acutely for this visit 

## 2017-03-09 NOTE — Assessment & Plan Note (Addendum)
BP controlled; no change in antihypertensive medications BMET reviewed

## 2017-04-19 ENCOUNTER — Non-Acute Institutional Stay (SKILLED_NURSING_FACILITY): Payer: Medicare Other

## 2017-04-19 DIAGNOSIS — Z0000 Encounter for general adult medical examination without abnormal findings: Secondary | ICD-10-CM

## 2017-04-19 NOTE — Progress Notes (Signed)
Subjective:   Jamie Haas is a 71 y.o. female who presents for an Initial Medicare Annual Wellness Visit at [facility]- long term SNF       Objective:    Today's Vitals   04/19/17 1532  BP: 120/60  Pulse: 90  Temp: 97.2 F (36.2 C)  TempSrc: Oral  SpO2: 90%  Weight: 211 lb (95.7 kg)  Height: 5\' 5"  (1.651 m)   Body mass index is 35.11 kg/m.   Current Medications (verified) Outpatient Encounter Prescriptions as of 04/19/2017  Medication Sig  . acetaminophen (TYLENOL) 500 MG tablet Take 500 mg by mouth 2 (two) times daily as needed for mild pain.  Marland Kitchen aspirin 325 MG tablet Take 325 mg by mouth daily.  . cholecalciferol (VITAMIN D) 1000 units tablet Take 1,000 Units by mouth daily.  . diclofenac sodium (VOLTAREN) 1 % GEL Apply 2 g topically 4 (four) times daily.  Marland Kitchen escitalopram (LEXAPRO) 10 MG tablet Take 10 mg by mouth daily.  . hydrOXYzine (ATARAX/VISTARIL) 25 MG tablet Take 25 mg by mouth at bedtime.  Marland Kitchen loratadine (CLARITIN) 10 MG tablet Take 10 mg by mouth daily.  Marland Kitchen losartan-hydrochlorothiazide (HYZAAR) 100-12.5 MG tablet Take 1 tablet by mouth daily.  . meloxicam (MOBIC) 7.5 MG tablet Take 7.5 mg by mouth daily.  . methocarbamol (ROBAXIN) 750 MG tablet Take 1 tablet (750 mg total) by mouth 2 (two) times daily as needed for muscle spasms.  . montelukast (SINGULAIR) 10 MG tablet Take 10 mg by mouth daily.   Marland Kitchen MYRBETRIQ 25 MG TB24 tablet Take 25 mg by mouth daily.  . ondansetron (ZOFRAN) 4 MG tablet Take 4 mg by mouth every 8 (eight) hours as needed for nausea or vomiting.  Marland Kitchen oxyCODONE (OXYCONTIN) 10 mg 12 hr tablet Take 1 tablet (10 mg total) by mouth every 12 (twelve) hours.  . polyethylene glycol (MIRALAX / GLYCOLAX) packet Take 17 g by mouth daily as needed for mild constipation.  . senna-docusate (SENOKOT S) 8.6-50 MG tablet Take 1 tablet by mouth at bedtime as needed.   No facility-administered encounter medications on file as of 04/19/2017.     Allergies  (verified) Patient has no known allergies.   History: Past Medical History:  Diagnosis Date  . 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 Arthritis   . Depression   . Hypertension   . Stroke [facility]) 2011   left sided weakness   Past Surgical History:  Procedure Laterality Date  . BRAIN SURGERY  2011   coils inserted  . HARDWARE REMOVAL Left 04/29/2016   Procedure: HARDWARE REMOVAL LEFT ANKLE;  Surgeon: Tarry Kos, MD;  Location: [facility] OR;  Service: Orthopedics;  Laterality: Left;  . ORIF ANKLE FRACTURE Left 12/24/2015   Procedure: OPEN REDUCTION INTERNAL FIXATION (ORIF) ANKLE FRACTURE;  Surgeon: Tarry Kos, MD;  Location: [facility] OR;  Service: Orthopedics;  Laterality: Left;  . TOTAL KNEE ARTHROPLASTY    . TOTAL KNEE ARTHROPLASTY Right 04/29/2016   Procedure: RIGHT TOTAL KNEE ARTHROPLASTY;  Surgeon: Tarry Kos, MD;  Location: [facility] OR;  Service: Orthopedics;  Laterality: Right;  . VULVAR LESION REMOVAL  03/17/2012   Procedure: VULVAR LESION;  Surgeon: Brock Bad, MD;  Location: [facility];  Service: Gynecology;  Laterality: N/A;  CO2 Laser Vaporization Of Condyloma   Family History  Problem Relation Age of Onset  . Hypertension Mother   . Osteoarthritis Mother   . Cancer Mother  Social History   Occupational History  . Not on file.   Social History Main Topics  . Smoking status: Current Every Day Smoker    Packs/day: 0.25    Years: 30.00    Types: Cigarettes  . Smokeless tobacco: Never Used  . Alcohol use No  . Drug use: No  . Sexual activity: Not on file    Tobacco Counseling Ready to quit: Not Answered Counseling given: Not Answered   Activities of Daily Living In your present state of health, do you have any difficulty performing the following activities: 04/19/2017 04/29/2016  Hearing? N N  Vision? N N  Difficulty concentrating or making decisions? N N  Walking or climbing stairs? Y Y  Dressing or bathing? Y Y  Doing errands, shopping? Y N    Preparing Food and eating ? Y -  Using the Toilet? Y -  In the past six months, have you accidently leaked urine? Y -  Do you have problems with loss of bowel control? Y -  Managing your Medications? Y -  Managing your Finances? Y -  Housekeeping or managing your Housekeeping? Y -  Some recent data might be hidden    Immunizations and Health Maintenance Immunization History  Administered Date(s) Administered  . PPD Test 05/27/2016   Health Maintenance Due  Topic Date Due  . COLONOSCOPY  09/24/1998  . DEXA SCAN  09/24/2013    Patient Care Team: Kirt Boysarter, Monica, DO as PCP - [facility]  Indicate any recent Medical Services you may have received from other than [facility] providers in the past year (date may be approximate).     Assessment:   This is a routine wellness examination for Jamie Haas.   Hearing/Vision screen No exam data present  Dietary issues and exercise activities discussed: Current Exercise Habits: The patient does not participate in regular exercise at present, Exercise limited by: None identified  Goals    . Maintain lifestyle          Pt will maintain lifestyle.       Depression Screen PHQ 2/9 Scores 04/19/2017  PHQ - 2 Score 0    Fall Risk Fall Risk  04/19/2017  Falls in the past year? No    Cognitive Function:     6CIT Screen 04/19/2017  What Year? 0 points  What month? 0 points  What time? 0 points  Count back from 20 0 points  Months in reverse 4 points  Repeat phrase 8 points  Total Score 12    Screening Tests Health Maintenance  Topic Date Due  . COLONOSCOPY  09/24/1998  . DEXA SCAN  09/24/2013  . TETANUS/TDAP  07/19/2017 (Originally 09/25/1965)  . Hepatitis C Screening  07/19/2017 (Originally 1946/07/21)  . PNA vac Low Risk Adult (1 of 2 - PCV13) 07/19/2017 (Originally 09/24/2013)  . INFLUENZA VACCINE  09/23/2017 (Originally 06/08/2017)  . MAMMOGRAM   06/16/2018      Plan:    I have personally reviewed and addressed the Medicare Annual Wellness questionnaire and have noted the following in the patient's chart:  A. Medical and social history B. Use of alcohol, tobacco or illicit drugs  C. Current medications and supplements D. Functional ability and status E.  Nutritional status F.  Physical activity G. Advance directives H. List of other physicians I.  Hospitalizations, surgeries, and ER visits in previous 12 months J.  Vitals K. Screenings to include hearing, vision, cognitive, depression L. Referrals and  appointments - none  In addition, I have reviewed and discussed with patient certain preventive protocols, quality metrics, and best practice recommendations. A written personalized care plan for preventive services as well as general preventive health recommendations were provided to patient.  See attached scanned questionnaire for additional information.   Signed,   Annetta Maw, RN Nurse Health Advisor   Quick Notes   Health Maintenance: DEXA, PNA 13 due     Abnormal Screen: 6 CIT-12    Patient Concerns: Pt c/o itchy back     Nurse Concerns: None

## 2017-04-19 NOTE — Patient Instructions (Signed)
Jamie Haas , Thank you for taking time to come for your Medicare Wellness Visit. I appreciate your ongoing commitment to your health goals. Please review the following plan we discussed and let me know if I can assist you in the future.   Screening recommendations/referrals: Colonoscopy long term pt Mammogram long term pt Bone Density due Recommended yearly ophthalmology/optometry visit for glaucoma screening and checkup Recommended yearly dental visit for hygiene and checkup  Vaccinations: Influenza vaccine due when availabe Pneumococcal vaccine 13 due Tdap vaccine not in records Shingles vaccine not in records  Advanced directives: Need a copy for chart  Conditions/risks identified: None  Next appointment: None upcoming   Preventive Care 65 Years and Older, Female Preventive care refers to lifestyle choices and visits with your health care provider that can promote health and wellness. What does preventive care include?  A yearly physical exam. This is also called an annual well check.  Dental exams once or twice a year.  Routine eye exams. Ask your health care provider how often you should have your eyes checked.  Personal lifestyle choices, including:  Daily care of your teeth and gums.  Regular physical activity.  Eating a healthy diet.  Avoiding tobacco and drug use.  Limiting alcohol use.  Practicing safe sex.  Taking low-dose aspirin every day.  Taking vitamin and mineral supplements as recommended by your health care provider. What happens during an annual well check? The services and screenings done by your health care provider during your annual well check will depend on your age, overall health, lifestyle risk factors, and family history of disease. Counseling  Your health care provider may ask you questions about your:  Alcohol use.  Tobacco use.  Drug use.  Emotional well-being.  Home and relationship well-being.  Sexual  activity.  Eating habits.  History of falls.  Memory and ability to understand (cognition).  Work and work Astronomerenvironment.  Reproductive health. Screening  You may have the following tests or measurements:  Height, weight, and BMI.  Blood pressure.  Lipid and cholesterol levels. These may be checked every 5 years, or more frequently if you are over 69 years old.  Skin check.  Lung cancer screening. You may have this screening every year starting at age 69 if you have a 30-pack-year history of smoking and currently smoke or have quit within the past 15 years.  Fecal occult blood test (FOBT) of the stool. You may have this test every year starting at age 69.  Flexible sigmoidoscopy or colonoscopy. You may have a sigmoidoscopy every 5 years or a colonoscopy every 10 years starting at age 69.  Hepatitis C blood test.  Hepatitis B blood test.  Sexually transmitted disease (STD) testing.  Diabetes screening. This is done by checking your blood sugar (glucose) after you have not eaten for a while (fasting). You may have this done every 1-3 years.  Bone density scan. This is done to screen for osteoporosis. You may have this done starting at age 69.  Mammogram. This may be done every 1-2 years. Talk to your health care provider about how often you should have regular mammograms. Talk with your health care provider about your test results, treatment options, and if necessary, the need for more tests. Vaccines  Your health care provider may recommend certain vaccines, such as:  Influenza vaccine. This is recommended every year.  Tetanus, diphtheria, and acellular pertussis (Tdap, Td) vaccine. You may need a Td booster every 10 years.  Zoster vaccine.  You may need this after age 51.  Pneumococcal 13-valent conjugate (PCV13) vaccine. One dose is recommended after age 2.  Pneumococcal polysaccharide (PPSV23) vaccine. One dose is recommended after age 84. Talk to your health care  provider about which screenings and vaccines you need and how often you need them. This information is not intended to replace advice given to you by your health care provider. Make sure you discuss any questions you have with your health care provider. Document Released: 11/21/2015 Document Revised: 07/14/2016 Document Reviewed: 08/26/2015 Elsevier Interactive Patient Education  2017 [place] Prevention in the Home Falls can cause injuries. They can happen to people of all ages. There are many things you can do to make your home safe and to help prevent falls. What can I do on the outside of my home?  Regularly fix the edges of walkways and driveways and fix any cracks.  Remove anything that might make you trip as you walk through a door, such as a raised step or threshold.  Trim any bushes or trees on the path to your home.  Use bright outdoor lighting.  Clear any walking paths of anything that might make someone trip, such as rocks or tools.  Regularly check to see if handrails are loose or broken. Make sure that both sides of any steps have handrails.  Any raised decks and porches should have guardrails on the edges.  Have any leaves, snow, or ice cleared regularly.  Use sand or salt on walking paths during winter.  Clean up any spills in your garage right away. This includes oil or grease spills. What can I do in the bathroom?  Use night lights.  Install grab bars by the toilet and in the tub and shower. Do not use towel bars as grab bars.  Use non-skid mats or decals in the tub or shower.  If you need to sit down in the shower, use a plastic, non-slip stool.  Keep the floor dry. Clean up any water that spills on the floor as soon as it happens.  Remove soap buildup in the tub or shower regularly.  Attach bath mats securely with double-sided non-slip rug tape.  Do not have throw rugs and other things on the floor that can make you trip. What can I do in  the bedroom?  Use night lights.  Make sure that you have a light by your bed that is easy to reach.  Do not use any sheets or blankets that are too big for your bed. They should not hang down onto the floor.  Have a firm chair that has side arms. You can use this for support while you get dressed.  Do not have throw rugs and other things on the floor that can make you trip. What can I do in the kitchen?  Clean up any spills right away.  Avoid walking on wet floors.  Keep items that you use a lot in easy-to-reach places.  If you need to reach something above you, use a strong step stool that has a grab bar.  Keep electrical cords out of the way.  Do not use floor polish or wax that makes floors slippery. If you must use wax, use non-skid floor wax.  Do not have throw rugs and other things on the floor that can make you trip. What can I do with my stairs?  Do not leave any items on the stairs.  Make sure that there are handrails on  both sides of the stairs and use them. Fix handrails that are broken or loose. Make sure that handrails are as long as the stairways.  Check any carpeting to make sure that it is firmly attached to the stairs. Fix any carpet that is loose or worn.  Avoid having throw rugs at the top or bottom of the stairs. If you do have throw rugs, attach them to the floor with carpet tape.  Make sure that you have a light switch at the top of the stairs and the bottom of the stairs. If you do not have them, ask someone to add them for you. What else can I do to help prevent falls?  Wear shoes that:  Do not have high heels.  Have rubber bottoms.  Are comfortable and fit you well.  Are closed at the toe. Do not wear sandals.  If you use a stepladder:  Make sure that it is fully opened. Do not climb a closed stepladder.  Make sure that both sides of the stepladder are locked into place.  Ask someone to hold it for you, if possible.  Clearly mark and  make sure that you can see:  Any grab bars or handrails.  First and last steps.  Where the edge of each step is.  Use tools that help you move around (mobility aids) if they are needed. These include:  Canes.  Walkers.  Scooters.  Crutches.  Turn on the lights when you go into a dark area. Replace any light bulbs as soon as they burn out.  Set up your furniture so you have a clear path. Avoid moving your furniture around.  If any of your floors are uneven, fix them.  If there are any pets around you, be aware of where they are.  Review your medicines with your doctor. Some medicines can make you feel dizzy. This can increase your chance of falling. Ask your doctor what other things that you can do to help prevent falls. This information is not intended to replace advice given to you by your health care provider. Make sure you discuss any questions you have with your health care provider. Document Released: 08/21/2009 Document Revised: 04/01/2016 Document Reviewed: 11/29/2014 Elsevier Interactive Patient Education  2017 [employer].

## 2017-04-26 IMAGING — CR DG FOOT COMPLETE 3+V*L*
3 series · 3 of 3 positions shown · non-contrast
Comparison: None.

CLINICAL DATA: Patient missed a step and fell today. Edema to the
left ankle.

EXAM:
LEFT FOOT - COMPLETE 3+ VIEW

[foot ap]
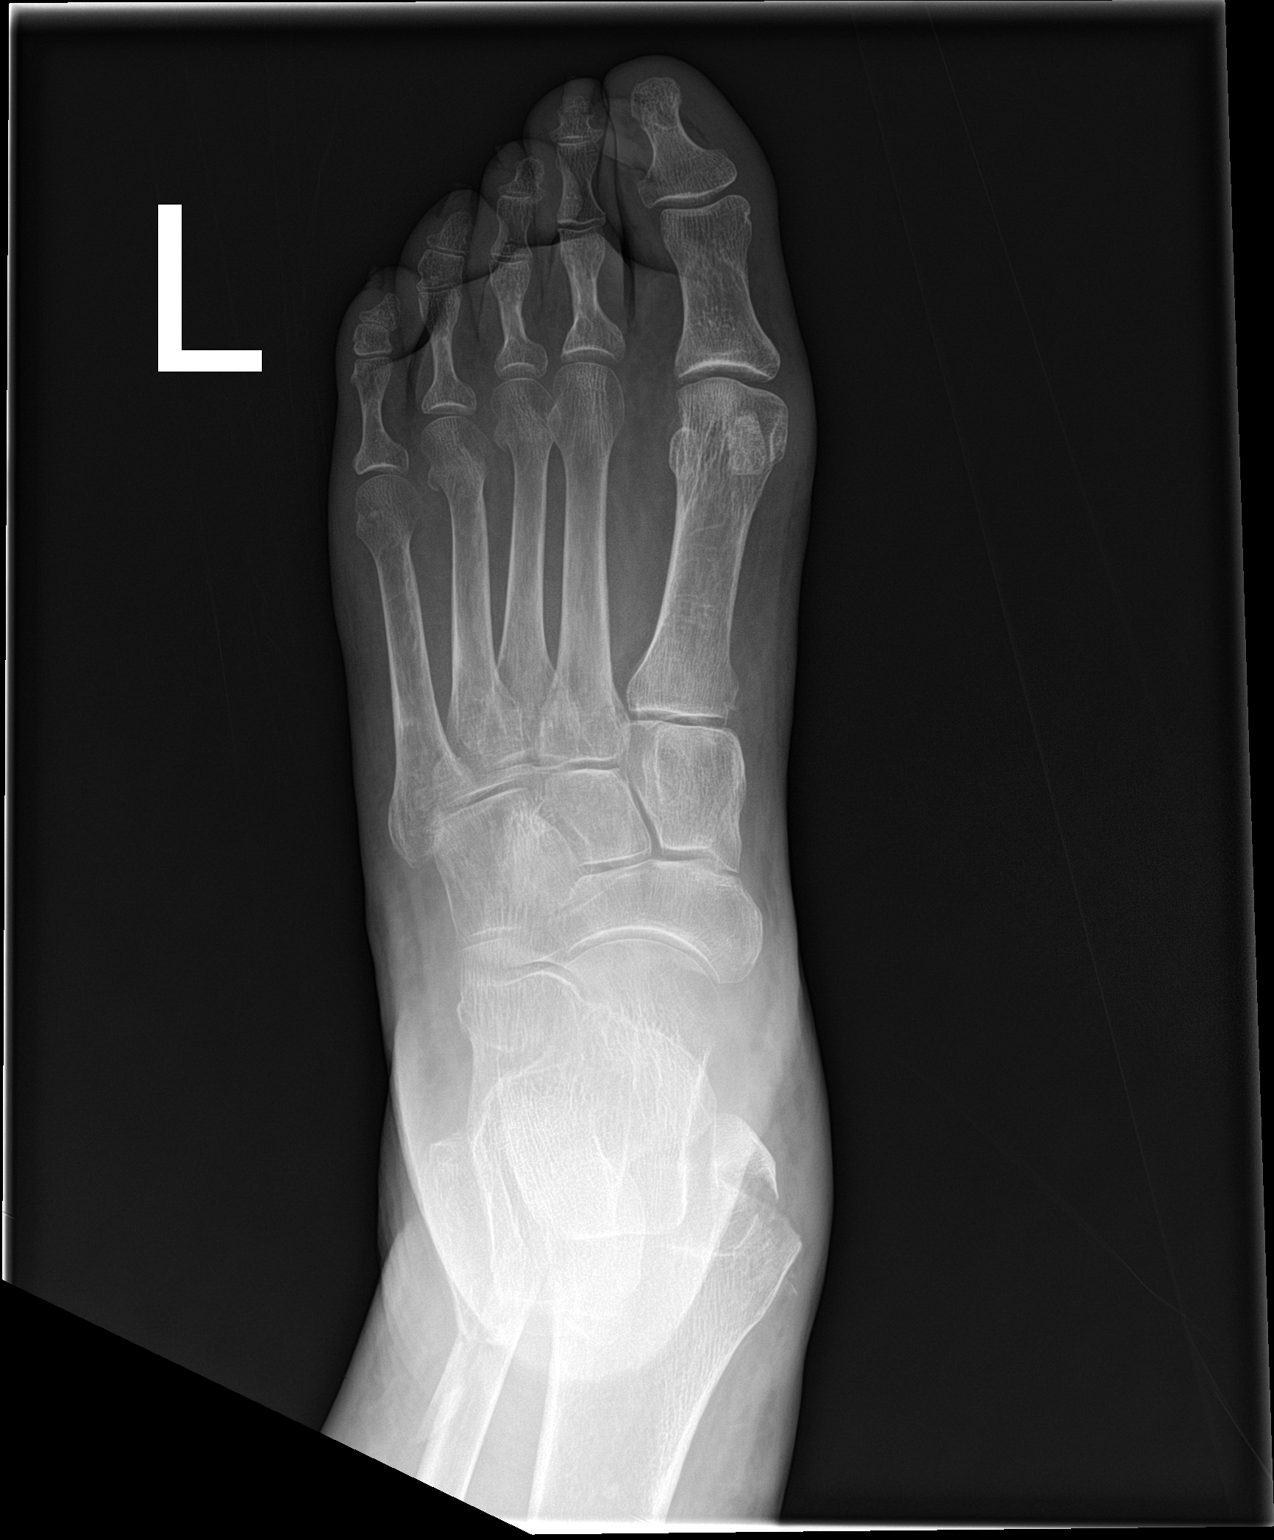

[foot obl]
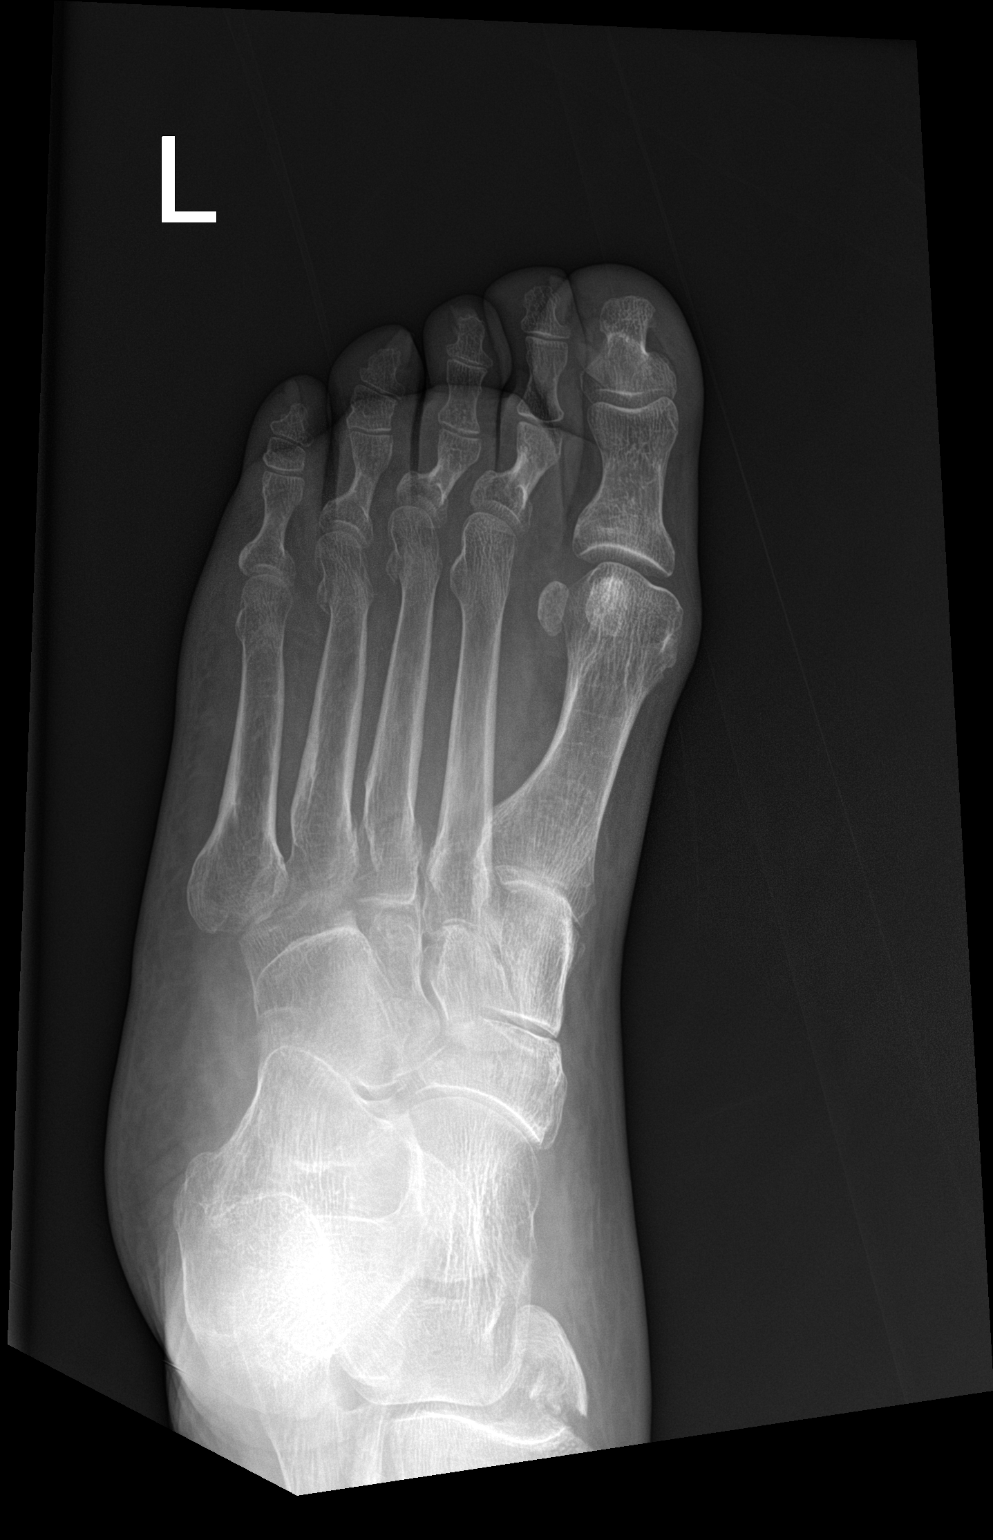

[foot lat]
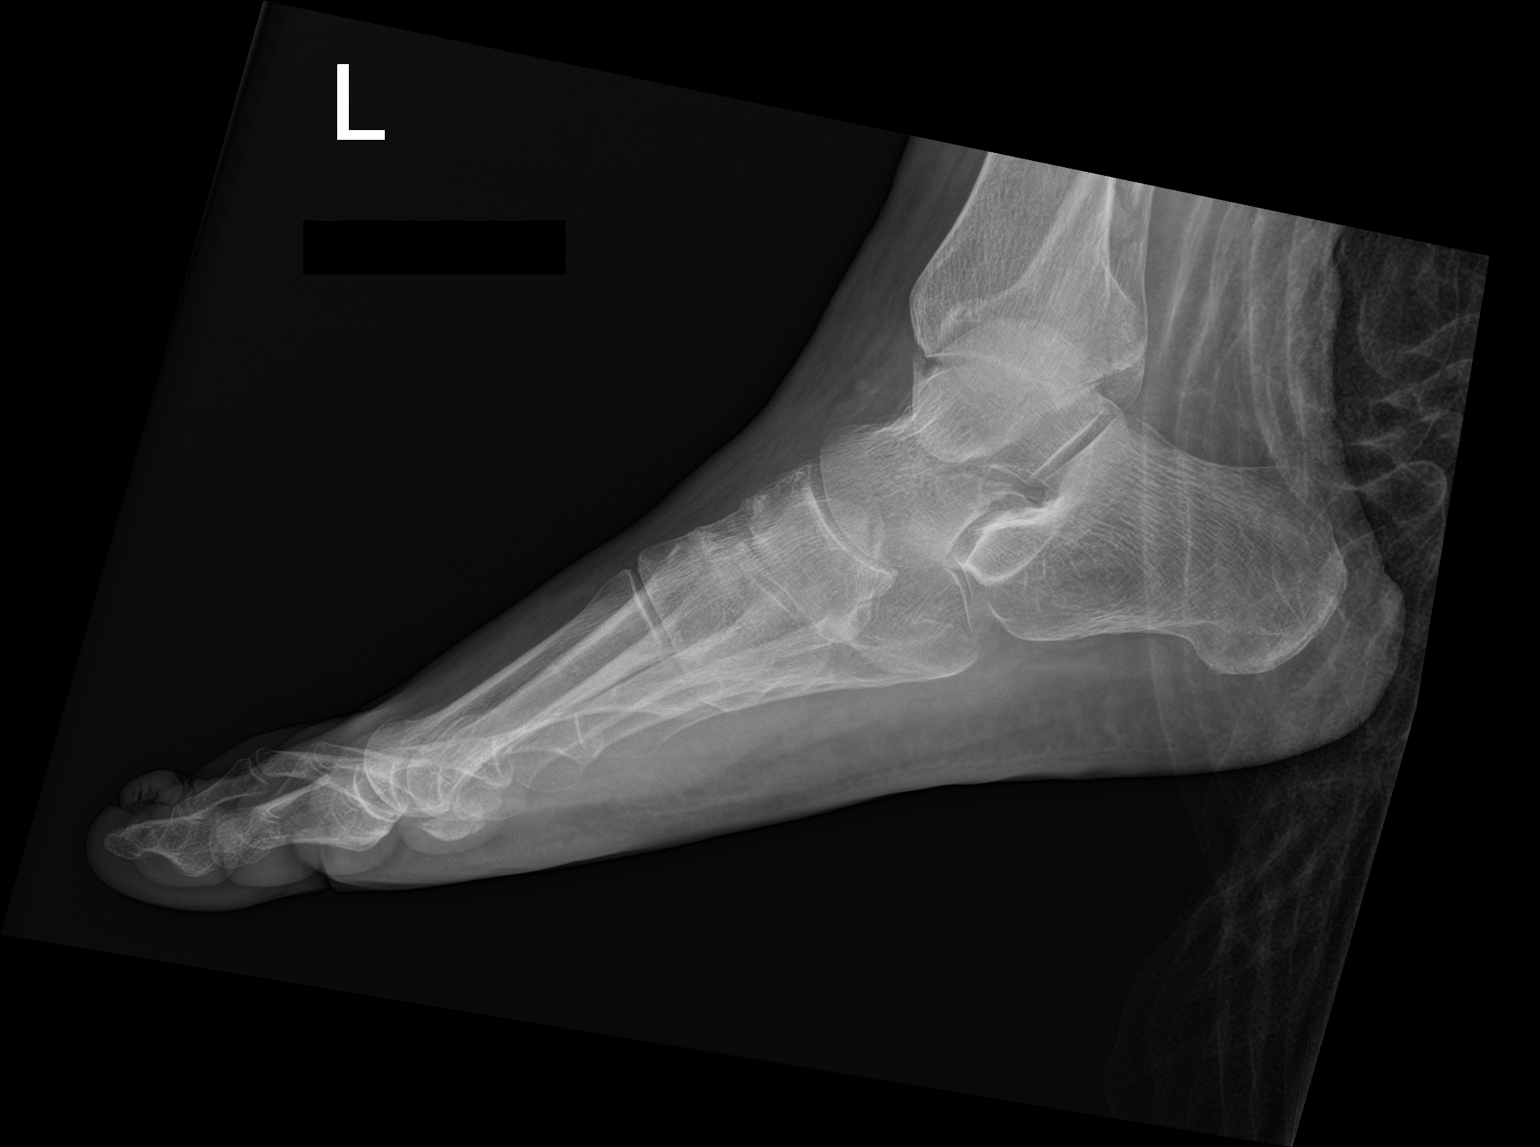

[3 of 3 positions shown; findings below may reference images not displayed]

FINDINGS: Diffuse bone demineralization. Fractures are demonstrated in the
distal left tibia and fibula. See additional report of left ankle.
No additional fractures demonstrated in the tarsal, metatarsal, or
phalangeal bones of the left foot. No focal bone lesion or bone
destruction. Soft tissues swelling over the left ankle.
IMPRESSION: Fractures of the medial and lateral malleolus of the left ankle
better visualized on previous ankle radiographs. No additional
fractures demonstrated in the left foot.

## 2017-04-26 IMAGING — CR DG ANKLE COMPLETE 3+V*L*
3 series · 3 of 3 positions shown · non-contrast
Comparison: None.

CLINICAL DATA: Patient missed a step and fell today. Left ankle
edema.

EXAM:
LEFT ANKLE COMPLETE - 3+ VIEW

[ankle ap]
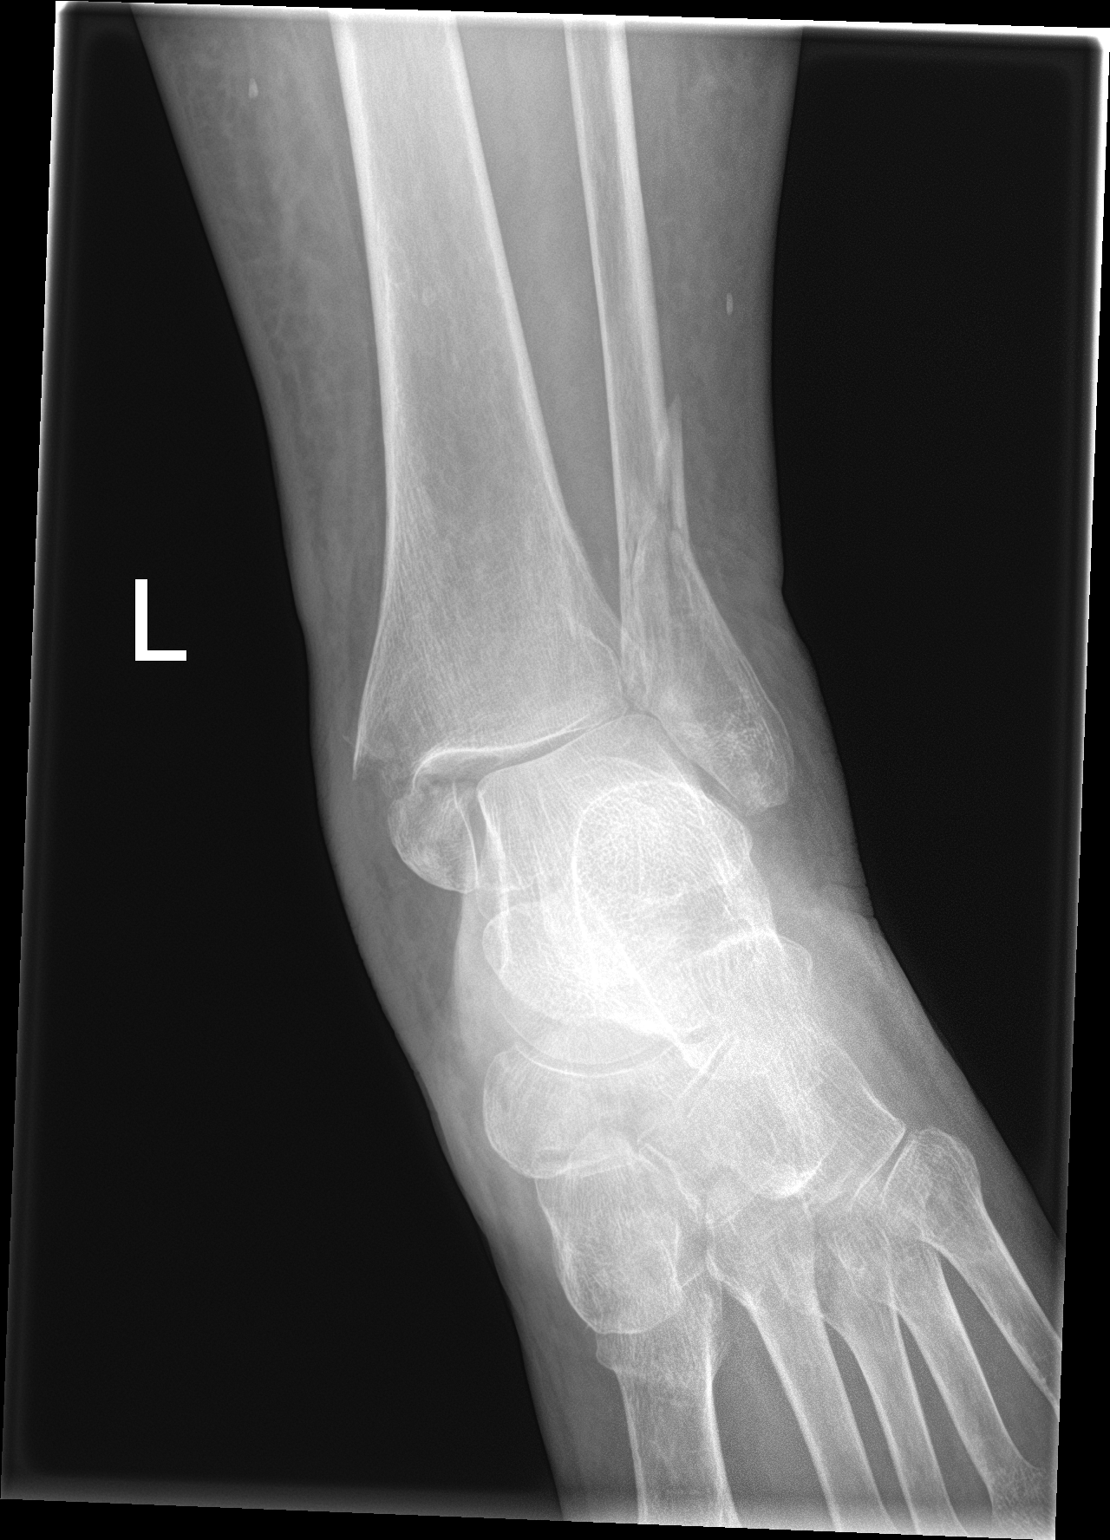

[ankle obl]
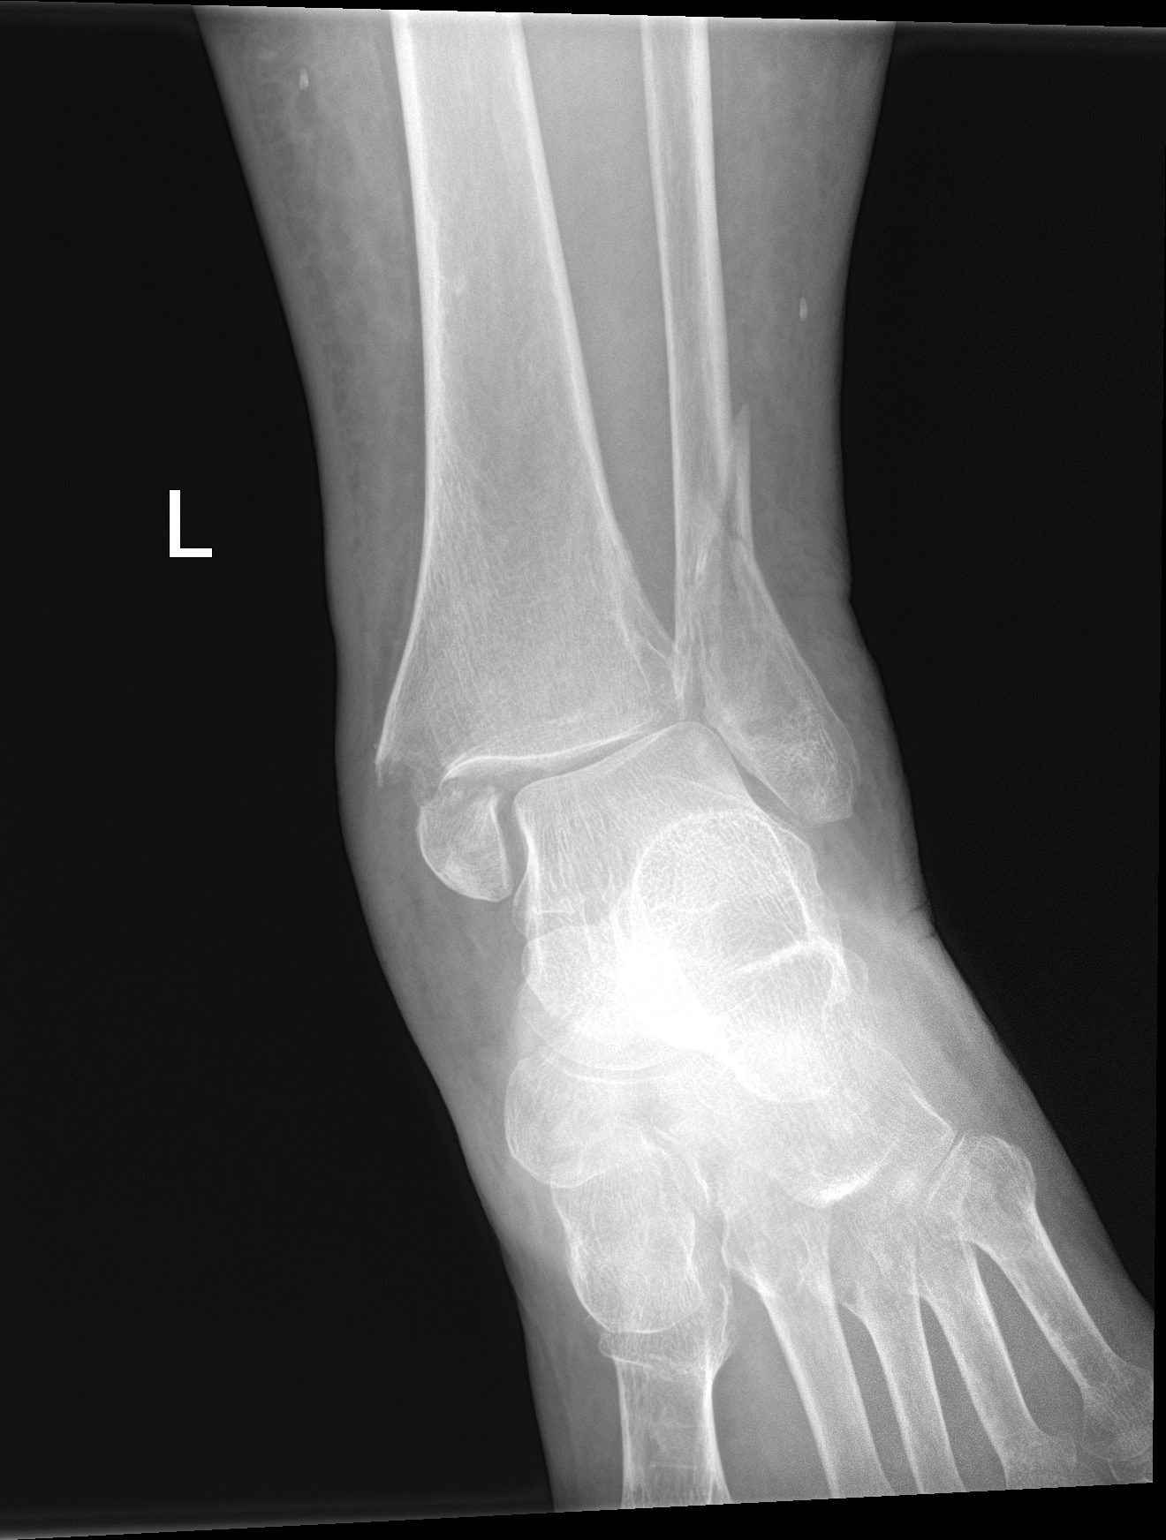

[ankle lat]
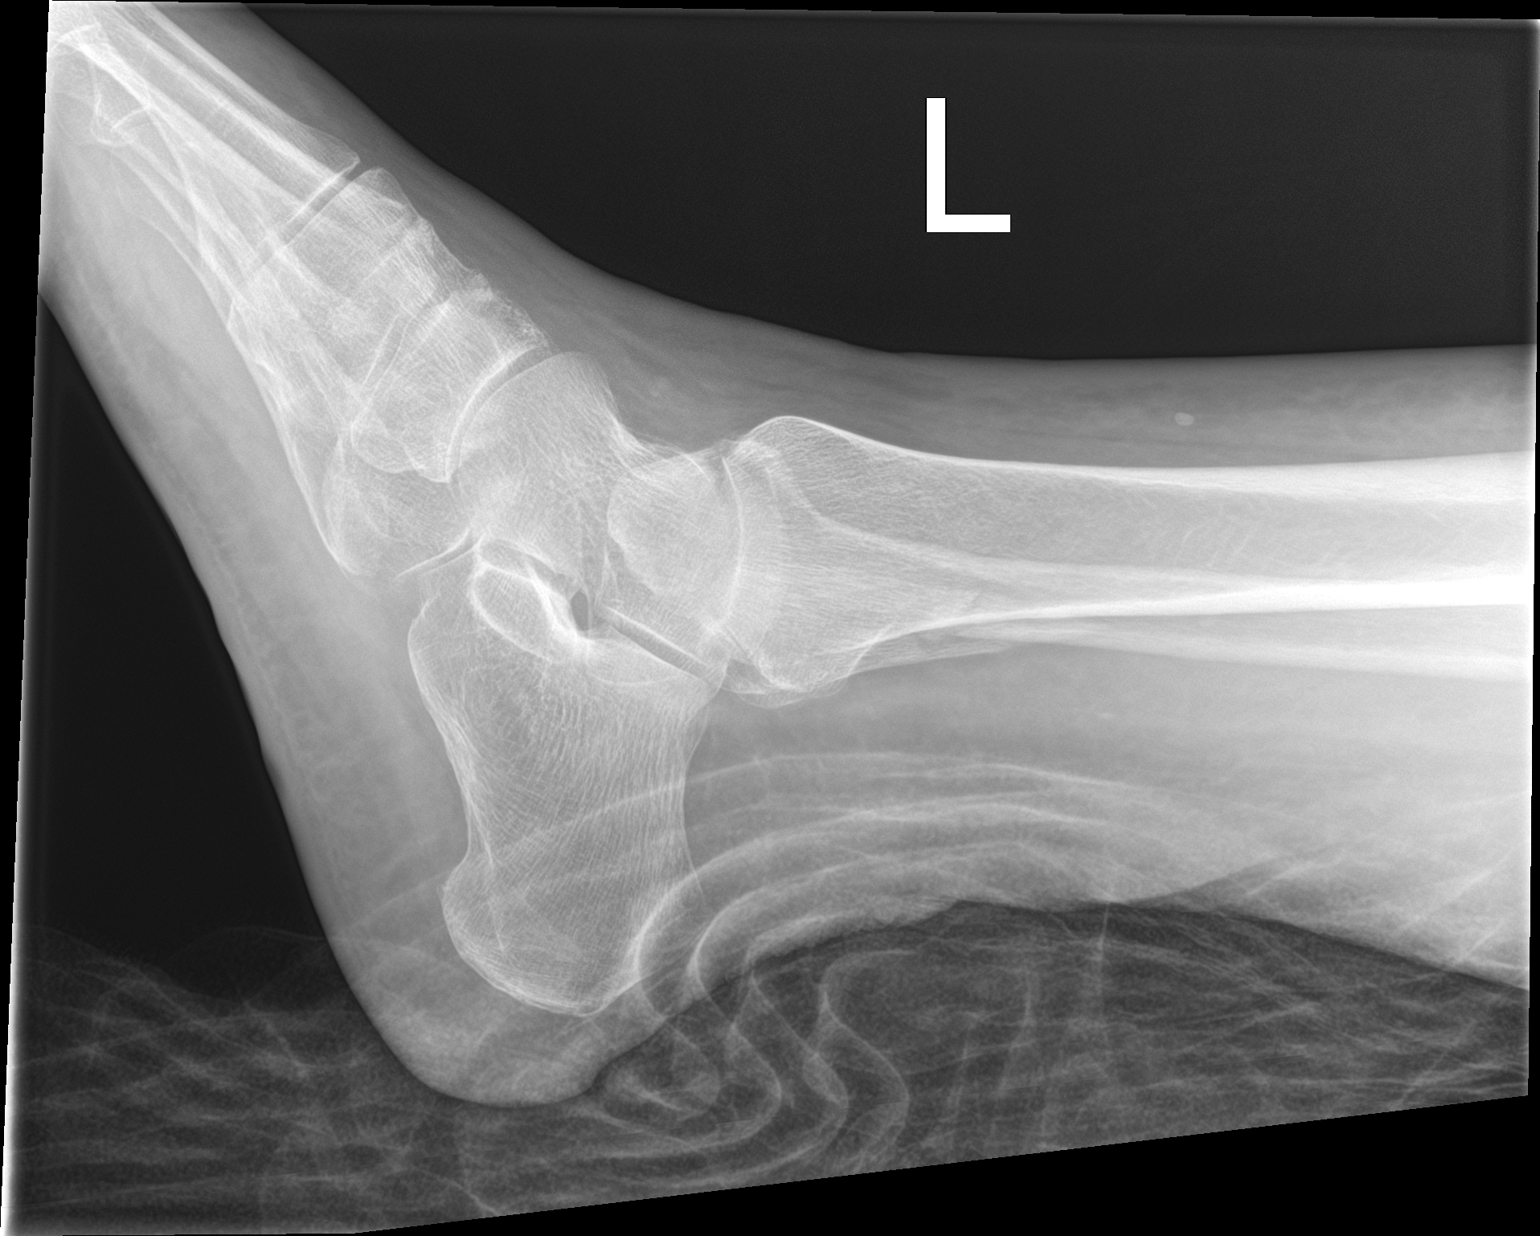

[3 of 3 positions shown; findings below may reference images not displayed]

FINDINGS: Acute transverse fracture of the medial malleolus of the left ankle
extending to the articular surface. There is lateral displacement of
the distal fracture fragment. Widening of the medial tibiotalar
joint. Oblique comminuted fracture of the distal left fibula with
extension to the tibia fibular joint. There is lateral angulation of
the distal fracture fragment. Lateral displacement of the talus with
respect to the tibia. Narrowing of the lateral tibiotalar joint
space. No definite posterior malleolar fracture. No focal bone
lesion. Diffuse soft tissue swelling about the left ankle.
IMPRESSION: Acute fractures of the medial and lateral malleolus of the left
ankle with lateral displacement of the distal fracture fragments.

## 2018-01-09 ENCOUNTER — Other Ambulatory Visit: Payer: Self-pay | Admitting: Family Medicine

## 2018-01-09 DIAGNOSIS — Z1231 Encounter for screening mammogram for malignant neoplasm of breast: Secondary | ICD-10-CM

## 2018-02-08 ENCOUNTER — Ambulatory Visit: Payer: Medicare Other

## 2018-03-01 ENCOUNTER — Ambulatory Visit: Payer: Medicare Other

## 2019-07-04 DIAGNOSIS — E119 Type 2 diabetes mellitus without complications: Secondary | ICD-10-CM

## 2019-07-04 HISTORY — DX: Type 2 diabetes mellitus without complications: E11.9

## 2023-06-29 ENCOUNTER — Encounter: Payer: Self-pay | Admitting: Endocrinology

## 2023-06-29 ENCOUNTER — Ambulatory Visit (INDEPENDENT_AMBULATORY_CARE_PROVIDER_SITE_OTHER): Payer: Medicare (Managed Care) | Admitting: Endocrinology

## 2023-06-29 VITALS — BP 110/70 | HR 94

## 2023-06-29 DIAGNOSIS — E0590 Thyrotoxicosis, unspecified without thyrotoxic crisis or storm: Secondary | ICD-10-CM | POA: Diagnosis not present

## 2023-06-29 NOTE — Progress Notes (Addendum)
Outpatient Endocrinology Note [place] Sorn, MD  06/29/23  Patient's Name: Jamie Haas    DOB: 1946/01/01    MRN: [id number]  REASON OF VISIT: New consult  for hyperthyroidism  REFERRING PROVIDER:   PCP: No primary care provider on file.  HISTORY OF PRESENT ILLNESS:   Jamie Haas is a 77 y.o. old female with past medical history as listed below is presented for a evaluation of hyperthyroidism.   Pertinent Thyroid History: Referred for evaluation and management of hyperthyroidism.  Patient had thyroid lab TSH < 0.01 on May 06, 2023 and repeat thyroid function test on May 18, 2023 reviewed from the medical record and noted as follows: TSH <0.01, reference range 0.27 - 4.20, Total T4 10.5, reference range 5.1-14.1 Thyroid uptake 0.93, reference range 0.80-1.30. Free thyroxine index 0.2 low reference range 4.4-11.4.  Patient denies palpitation, heat intolerance, increased sweating.  No family history of thyroid disorder.  Patient had cold symptoms /upper respiratory tract infection requiring oxygen therapy in June.  Does not recall having neck pain.  Patient denies taking steroid during that illness in June.  Denies CT scan with IV iodine contrast.  Patient is from facility [facility] and accompanied by the facility staff CNA in the clinic visit today.  Medication list from the facility reviewed.  Interval history 06/29/23 Medical record including thyroid function test from facility reviewed as noted above.  No hypo or hyperthyroid symptoms.  She has occasional dysphagia.  She had history of stroke with left-sided residual weakness.  REVIEW OF SYSTEMS:  As per history of present illness.   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   Arthritis    Depression    Hypertension    Stroke [facility]) 2011   left sided weakness    PAST SURGICAL HISTORY: Past Surgical History:  Procedure Laterality  Date   BRAIN SURGERY  2011   coils inserted   HARDWARE REMOVAL Left 04/29/2016   Procedure: HARDWARE REMOVAL LEFT ANKLE;  Surgeon: Tarry Kos, MD;  Location: [facility] OR;  Service: Orthopedics;  Laterality: Left;   ORIF ANKLE FRACTURE Left 12/24/2015   Procedure: OPEN REDUCTION INTERNAL FIXATION (ORIF) ANKLE FRACTURE;  Surgeon: Tarry Kos, MD;  Location: [facility] OR;  Service: Orthopedics;  Laterality: Left;   TOTAL KNEE ARTHROPLASTY     TOTAL KNEE ARTHROPLASTY Right 04/29/2016   Procedure: RIGHT TOTAL KNEE ARTHROPLASTY;  Surgeon: Tarry Kos, MD;  Location: [facility] OR;  Service: Orthopedics;  Laterality: Right;   VULVAR LESION REMOVAL  03/17/2012   Procedure: VULVAR LESION;  Surgeon: Brock Bad, MD;  Location: [facility] ORS;  Service: Gynecology;  Laterality: N/A;  CO2 Laser Vaporization Of Condyloma    ALLERGIES: No Known Allergies  FAMILY HISTORY:  Family History  Problem Relation Age of Onset   Hypertension Mother    Osteoarthritis Mother    Cancer Mother     SOCIAL HISTORY: Social History   Socioeconomic History   Marital status: Legally Separated    Spouse name: Not on file   Number of children: Not on file   Years of education: Not on file   Highest education level: Not on file  Occupational History   Not on file  Tobacco Use   Smoking status: Every Day    Current packs/day: 0.25    Average packs/day: 0.3 packs/day for 30.0 years (7.5 ttl pk-yrs)    Types:  Cigarettes   Smokeless tobacco: Never  Substance and Sexual Activity   Alcohol use: No   Drug use: No   Sexual activity: Not on file  Other Topics Concern   Not on file  Social History Narrative   Not on file   Social Determinants of Health   Financial Resource Strain: Not on file  Food Insecurity: Not on file  Transportation Needs: Not on file  Physical Activity: Not on file  Stress: Not on file  Social Connections: Not on file    MEDICATIONS:  Current Outpatient Medications  Medication Sig Dispense Refill    acetaminophen (TYLENOL) 500 MG tablet Take 500 mg by mouth 2 (two) times daily as needed for mild pain.     aspirin 325 MG tablet Take 325 mg by mouth daily.     atorvastatin (LIPITOR) 10 MG tablet Take 10 mg by mouth daily.     cholecalciferol (VITAMIN D) 1000 units tablet Take 1,000 Units by mouth daily.     diclofenac sodium (VOLTAREN) 1 % GEL Apply 2 g topically 4 (four) times daily. 100 g 0   escitalopram (LEXAPRO) 10 MG tablet Take 10 mg by mouth daily.     gabapentin (NEURONTIN) 100 MG capsule Take 100 mg by mouth daily.     losartan-hydrochlorothiazide (HYZAAR) 100-12.5 MG tablet Take 1 tablet by mouth daily.     sertraline (ZOLOFT) 50 MG tablet Take 50 mg by mouth daily.     valACYclovir (VALTREX) 500 MG tablet Take 500 mg by mouth daily.     Venlafaxine HCl 75 MG TB24 Take 1 tablet by mouth daily.     hydrOXYzine (ATARAX/VISTARIL) 25 MG tablet Take 25 mg by mouth at bedtime. (Patient not taking: Reported on 06/29/2023)     loratadine (CLARITIN) 10 MG tablet Take 10 mg by mouth daily. (Patient not taking: Reported on 06/29/2023)     meloxicam (MOBIC) 7.5 MG tablet Take 7.5 mg by mouth daily. (Patient not taking: Reported on 06/29/2023)     methocarbamol (ROBAXIN) 750 MG tablet Take 1 tablet (750 mg total) by mouth 2 (two) times daily as needed for muscle spasms. (Patient not taking: Reported on 06/29/2023) 60 tablet 0   montelukast (SINGULAIR) 10 MG tablet Take 10 mg by mouth daily.  (Patient not taking: Reported on 06/29/2023)     MYRBETRIQ 25 MG TB24 tablet Take 25 mg by mouth daily. (Patient not taking: Reported on 06/29/2023)  0   ondansetron (ZOFRAN) 4 MG tablet Take 4 mg by mouth every 8 (eight) hours as needed for nausea or vomiting. (Patient not taking: Reported on 06/29/2023)     oxyCODONE (OXYCONTIN) 10 mg 12 hr tablet Take 1 tablet (10 mg total) by mouth every 12 (twelve) hours. (Patient not taking: Reported on 06/29/2023) 10 tablet 0   polyethylene glycol (MIRALAX / GLYCOLAX) packet  Take 17 g by mouth daily as needed for mild constipation. (Patient not taking: Reported on 06/29/2023)     senna-docusate (SENOKOT S) 8.6-50 MG tablet Take 1 tablet by mouth at bedtime as needed. (Patient not taking: Reported on 06/29/2023) 30 tablet 1   No current facility-administered medications for this visit.    PHYSICAL EXAM: Vitals:   06/29/23 0903  BP: 110/70  Pulse: 94  SpO2: 95%   There is no height or weight on file to calculate BMI.  Wt Readings from Last 3 Encounters:  04/19/17 211 lb (95.7 kg)  03/09/17 210 lb 12.8 oz (95.6 kg)  12/09/16 201 lb  8 oz (91.4 kg)     General: Well developed, well nourished female in no apparent distress.  HEENT: AT/[place], no external lesions. Hearing intact to the spoken word Eyes: No stare, proptosis or lid lag. Conjunctiva clear and no icterus. No erythema or watering Neck: Trachea midline, neck supple without appreciable thyromegaly or lymphadenopathy and no palpable thyroid nodules Lungs: Clear to auscultation Heart: S1S2, Regular in rate and rhythm.  Abdomen: Soft Neurologic: Alert, oriented, normal speech, left-sided weakness Extremities: No pedal pitting edema, no tremors of outstretched right hand Skin: Warm, Psychiatric: Does not appear depressed or anxious  PERTINENT HISTORIC LABORATORY AND IMAGING STUDIES:  All pertinent laboratory results were reviewed. Please see HPI also for further details.   No results found for: "TSH", "T3TOTAL"  No results found for: "FREET4", "T3FREE", "TSH"  No results found for: "THYROTRECAB"  No results found for: "TSH", "FREET4"   No results found for: "TSI"   No components found for: "TRAB"    ASSESSMENT / PLAN  1. Hyperthyroidism    -Patient had thyroid lab with suppressed TSH on June 28 and July 10 with normal total T4 and thyroid uptake.  Patient had cold symptoms requiring oxygen therapy in June.  I suspect she might had subacute thyroiditis to cause transient hyperthyroidism.  Subacute  thyroiditis is expected to improve in 6 to 8 weeks.  She is clinically euthyroid today.  I would also like to look for autoimmune thyroiditis.  If she persistently has hyperthyroidism will consider for thyroid ultrasound /and nuclear scan if needed.  Plan: -Check thyroid function test TSH, free T4, free T3 -Check thyroid autoantibodies thyrotropin receptor antibody, thyroid peroxidase antibody and thyroglobulin antibody. -Rest of the management based of these test results.  Stuti was seen today for establish care.  Diagnoses and all orders for this visit:  Hyperthyroidism -     T3, free; Future -     T4, free; Future -     TSH; Future -     Thyroglobulin antibody; Future -     TRAb (TSH Receptor Binding Antibody); Future -     Thyroid peroxidase antibody; Future -     Thyroid peroxidase antibody -     TRAb (TSH Receptor Binding Antibody) -     Thyroglobulin antibody -     TSH -     T4, free -     T3, free    DISPOSITION Follow up in clinic in to be determined based on the above plan.  All questions answered and patient verbalized understanding of the plan.  [place] Zyiere Rosemond, MD [facility] Endocrinology [facility] [address] [place], [address] [place], [place] [address] Phone # [phone]   At least part of this note was generated using voice recognition software. Inadvertent word errors may have occurred, which were not recognized during the proofreading process.  Addendum:  Labs reviewed she continues to have suppressed TSH with normal free T4 and free T3.  She has negative thyroglobulin antibody and thyroid peroxidase antibody.  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  -Start methimazole 5 mg daily. -Check TSH, free T4, free T3 1 week prior to follow-up visit with me in 2 months. -Follow-up with me in 2 months.     Latest Reference Range & Units 06/29/23 09:28  TSH 0.35 - 5.50 uIU/mL  <0.01 Repeated and verified X2. (L)  Triiodothyronine,Free,Serum 2.3 - 4.2 pg/mL 3.2  T4,Free(Direct) 0.60 - 1.60 ng/dL 6.27  Thyroglobulin Ab <  or = 1 IU/mL <1  Thyroperoxidase Ab SerPl-aCnc <9 IU/mL 5  TRAB <=2.00 IU/L 2.19 (H)  (L): Data is abnormally low (H): Data is abnormally high

## 2023-06-29 NOTE — Patient Instructions (Signed)
Will do thyroid lab including antibody test today.

## 2023-07-01 LAB — TSH: TSH: <0.01 u[IU]/mL — ABNORMAL LOW (ref 0.35–5.50)

## 2023-07-01 LAB — T4, FREE: Free T4: 1.15 ng/dL (ref 0.60–1.60)

## 2023-07-01 LAB — T3, FREE: T3, Free: 3.2 pg/mL (ref 2.3–4.2)

## 2023-07-02 LAB — TRAB (TSH RECEPTOR BINDING ANTIBODY): TRAB: 2.19 IU/L — ABNORMAL HIGH (ref <=2.00)

## 2023-07-02 LAB — THYROGLOBULIN ANTIBODY: Thyroglobulin Ab: <1 [IU]/mL (ref <=1)

## 2023-07-02 LAB — THYROID PEROXIDASE ANTIBODY: Thyroperoxidase Ab SerPl-aCnc: 5 [IU]/mL (ref <9)

## 2023-07-06 NOTE — Addendum Note (Signed)
Addended by: Raeana Blinn, [place] on: 07/06/2023 06:36 PM   Modules accepted: Orders

## 2023-09-14 ENCOUNTER — Encounter: Payer: Self-pay | Admitting: Endocrinology

## 2023-11-21 ENCOUNTER — Ambulatory Visit: Payer: Medicare (Managed Care) | Admitting: Endocrinology

## 2023-12-22 ENCOUNTER — Other Ambulatory Visit: Payer: Self-pay

## 2023-12-22 ENCOUNTER — Ambulatory Visit (INDEPENDENT_AMBULATORY_CARE_PROVIDER_SITE_OTHER): Payer: Medicare (Managed Care) | Admitting: Endocrinology

## 2023-12-22 ENCOUNTER — Encounter: Payer: Self-pay | Admitting: Endocrinology

## 2023-12-22 VITALS — BP 110/60 | HR 85 | Resp 20

## 2023-12-22 DIAGNOSIS — E0500 Thyrotoxicosis with diffuse goiter without thyrotoxic crisis or storm: Secondary | ICD-10-CM | POA: Diagnosis not present

## 2023-12-22 DIAGNOSIS — E0590 Thyrotoxicosis, unspecified without thyrotoxic crisis or storm: Secondary | ICD-10-CM

## 2023-12-22 NOTE — Progress Notes (Signed)
Outpatient Endocrinology Note [place] Chaden Doom, MD  12/22/23  Patient's Name: Jamie Haas    DOB: 1946/01/24    MRN: [id number]  REASON OF VISIT: Follow-up for hyperthyroidism Luiz Blare' disease  REFERRING PROVIDER:   PCP: Donia Ast., MD  HISTORY OF PRESENT ILLNESS:   Jamie Haas is a 78 y.o. old female with past medical history as listed below is presented for a evaluation of hyperthyroidism.   Pertinent Thyroid History: Referred for evaluation and management of hyperthyroidism, initial Endo consult in August 2024.  Patient had thyroid lab TSH < 0.01 on May 06, 2023 and repeat thyroid function test on May 18, 2023 reviewed from the medical record and noted as follows: TSH <0.01, reference range 0.27 - 4.20, Total T4 10.5, reference range 5.1-14.1, Thyroid uptake 0.93, reference range 0.80-1.30. Free thyroxine index 0.2 low reference range 4.4-11.4.  Patient denies palpitation, heat intolerance, increased sweating.  No family history of thyroid disorder.  Patient had cold symptoms /upper respiratory tract infection requiring oxygen therapy in June.  Does not recall having neck pain.  Patient denies taking steroid during that illness in June.  Denies CT scan with IV iodine contrast.  - Laboratory evaluation in August 2024 : suppressed TSH with normal free T4 and free T3.  She had negative thyroglobulin antibody and thyroid peroxidase antibody.  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  - In August 2024 started on methimazole 5 mg daily.   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   Interval history  Patient is currently taking methimazole 5 mg daily.  Medication list from the facility reviewed.  Patient denies palpitation or heat intolerance.  No change in healthy status.  No cold intolerance.   No GI upset or any recent illness with fever.  She is accompanied by staff from the facility.  REVIEW OF SYSTEMS:  As per history of present illness.   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   Arthritis    Depression    Hypertension    Stroke [facility]) 2011   left sided weakness    PAST SURGICAL HISTORY: Past Surgical History:  Procedure Laterality Date   BRAIN SURGERY  2011   coils inserted   HARDWARE REMOVAL Left 04/29/2016   Procedure: HARDWARE REMOVAL LEFT ANKLE;  Surgeon: Tarry Kos, MD;  Location: [facility] OR;  Service: Orthopedics;  Laterality: Left;   ORIF ANKLE FRACTURE Left 12/24/2015   Procedure: OPEN REDUCTION INTERNAL FIXATION (ORIF) ANKLE FRACTURE;  Surgeon: Tarry Kos, MD;  Location: [facility] OR;  Service: Orthopedics;  Laterality: Left;   TOTAL KNEE ARTHROPLASTY     TOTAL KNEE ARTHROPLASTY Right 04/29/2016   Procedure: RIGHT TOTAL KNEE ARTHROPLASTY;  Surgeon: Tarry Kos, MD;  Location: [facility] OR;  Service: Orthopedics;  Laterality: Right;   VULVAR LESION REMOVAL  03/17/2012   Procedure: VULVAR LESION;  Surgeon: Brock Bad, MD;  Location: [facility];  Service: Gynecology;  Laterality: N/A;  CO2 Laser Vaporization Of Condyloma    ALLERGIES: No Known Allergies  FAMILY HISTORY:  Family History  Problem Relation Age of Onset   Hypertension Mother    Osteoarthritis Mother    Cancer Mother     SOCIAL HISTORY: Social History   Socioeconomic History  Marital status: Legally Separated    Spouse name: Not on file   Number of children: Not on file   Years of education: Not on file   Highest education level: Not on file  Occupational History   Not on file  Tobacco Use   Smoking status: Every Day    Current packs/day: 0.25    Average packs/day: 0.3 packs/day for 30.0 years (7.5 ttl pk-yrs)    Types: Cigarettes   Smokeless tobacco: Never  Substance and Sexual Activity   Alcohol use: No    Drug use: No   Sexual activity: Not on file  Other Topics Concern   Not on file  Social History Narrative   ** Merged History Encounter **       Social Drivers of Corporate investment banker Strain: Not on file  Food Insecurity: Not on file  Transportation Needs: Not on file  Physical Activity: Not on file  Stress: Not on file  Social Connections: Not on file    MEDICATIONS:  Current Outpatient Medications  Medication Sig Dispense Refill   acetaminophen (TYLENOL) 500 MG tablet Take 500 mg by mouth 2 (two) times daily as needed for mild pain.     aspirin 325 MG tablet Take 325 mg by mouth daily.     atorvastatin (LIPITOR) 10 MG tablet Take 10 mg by mouth daily.     cholecalciferol (VITAMIN D) 1000 units tablet Take 1,000 Units by mouth daily.     diclofenac sodium (VOLTAREN) 1 % GEL Apply 2 g topically 4 (four) times daily. 100 g 0   escitalopram (LEXAPRO) 10 MG tablet Take 10 mg by mouth daily.     gabapentin (NEURONTIN) 100 MG capsule Take 100 mg by mouth daily.     hydrOXYzine (ATARAX/VISTARIL) 25 MG tablet Take 25 mg by mouth at bedtime.     loratadine (CLARITIN) 10 MG tablet Take 10 mg by mouth daily.     losartan-hydrochlorothiazide (HYZAAR) 100-12.5 MG tablet Take 1 tablet by mouth daily.     meloxicam (MOBIC) 7.5 MG tablet Take 7.5 mg by mouth daily.     methimazole (TAPAZOLE) 5 MG tablet Take 5 mg by mouth daily.     methocarbamol (ROBAXIN) 750 MG tablet Take 1 tablet (750 mg total) by mouth 2 (two) times daily as needed for muscle spasms. 60 tablet 0   mirtazapine (REMERON) 7.5 MG tablet Take 7.5 mg by mouth at bedtime.     montelukast (SINGULAIR) 10 MG tablet Take 10 mg by mouth daily.     MYRBETRIQ 25 MG TB24 tablet Take 25 mg by mouth daily.  0   ondansetron (ZOFRAN) 4 MG tablet Take 4 mg by mouth every 8 (eight) hours as needed for nausea or vomiting.     oxyCODONE (OXYCONTIN) 10 mg 12 hr tablet Take 1 tablet (10 mg total) by mouth every 12 (twelve) hours. 10 tablet  0   polyethylene glycol (MIRALAX / GLYCOLAX) packet Take 17 g by mouth daily as needed for mild constipation.     senna-docusate (SENOKOT S) 8.6-50 MG tablet Take 1 tablet by mouth at bedtime as needed. 30 tablet 1   sertraline (ZOLOFT) 50 MG tablet Take 50 mg by mouth daily.     valACYclovir (VALTREX) 500 MG tablet Take 500 mg by mouth daily.     Venlafaxine HCl 75 MG TB24 Take 1 tablet by mouth daily.     No current facility-administered medications for this visit.    PHYSICAL EXAM:  Vitals:   12/22/23 1011  BP: 110/60  Pulse: 85  Resp: 20  SpO2: 96%   There is no height or weight on file to calculate BMI.  Wt Readings from Last 3 Encounters:  04/19/17 211 lb (95.7 kg)  03/09/17 210 lb 12.8 oz (95.6 kg)  12/09/16 201 lb 8 oz (91.4 kg)     General: Well developed, well nourished female in no apparent distress.  HEENT: AT/[place], no external lesions. Hearing intact to the spoken word Eyes: No stare, proptosis or lid lag. Conjunctiva clear and no icterus. No erythema or watering Neck: Trachea midline, neck supple without appreciable thyromegaly or lymphadenopathy and no palpable thyroid nodules Lungs: Clear to auscultation Heart: S1S2, Regular in rate and rhythm.  Abdomen: Soft Neurologic: Alert, oriented, normal speech, left-sided weakness Extremities: No pedal pitting edema. Skin: Warm, Psychiatric: Does not appear depressed or anxious  PERTINENT HISTORIC LABORATORY AND IMAGING STUDIES:  All pertinent laboratory results were reviewed. Please see HPI also for further details.   TSH  Date Value Ref Range Status  06/29/2023 <0.01 Repeated and verified X2. (L) 0.35 - 5.50 uIU/mL Final    Lab Results  Component Value Date   FREET4 1.15 06/29/2023   T3FREE 3.2 06/29/2023   TSH <0.01 Repeated and verified X2. (L) 06/29/2023    No results found for: "THYROTRECAB"  Lab Results  Component Value Date   TSH <0.01 Repeated and verified X2. (L) 06/29/2023   FREET4 1.15  06/29/2023     No results found for: "TSI"   No components found for: "TRAB"    ASSESSMENT / PLAN  1. Hyperthyroidism   2. Graves disease    -Patient has hyperthyroidism due to Graves' disease with positive thyrotropin receptor antibody.  Methimazole 5 mg daily was restarted in August 2024.  Plan: -Check thyroid function test TSH, free T4, free T3 and adjust the dose of methimazole as needed.  Diagnoses and all orders for this visit:  Hyperthyroidism -     T4, free -     T3, free -     TSH  Graves disease   DISPOSITION Follow up in clinic in 3 months suggested.    All questions answered and patient verbalized understanding of the plan.  [place] Mercede Rollo, MD [facility] Endocrinology [facility] [address] [place], [address] [place], [place] [address] Phone # [phone]   At least part of this note was generated using voice recognition software. Inadvertent word errors may have occurred, which were not recognized during the proofreading process.

## 2023-12-23 ENCOUNTER — Telehealth: Payer: Self-pay

## 2023-12-23 LAB — T4, FREE: Free T4: 0.8 ng/dL (ref 0.8–1.8)

## 2023-12-23 LAB — T3, FREE: T3, Free: 2.3 pg/mL (ref 2.3–4.2)

## 2023-12-23 LAB — TSH: TSH: 3.88 m[IU]/L (ref 0.40–4.50)

## 2023-12-23 NOTE — Telephone Encounter (Signed)
Results and medication changes as directed by MD was left with staff at patient's nursing home. No further questions at this time.

## 2023-12-23 NOTE — Telephone Encounter (Signed)
-----   Message from [place] Thapa sent at 12/23/2023  8:15 AM EST ----- Please notify patient of normal thyroid function test, continue current dose of methimazole no change.

## 2024-02-07 DIAGNOSIS — J449 Chronic obstructive pulmonary disease, unspecified: Secondary | ICD-10-CM

## 2024-02-07 HISTORY — DX: Chronic obstructive pulmonary disease, unspecified: J44.9

## 2024-03-06 ENCOUNTER — Other Ambulatory Visit: Payer: Self-pay

## 2024-03-06 ENCOUNTER — Inpatient Hospital Stay (HOSPITAL_COMMUNITY)
Admission: EM | Admit: 2024-03-06 | Discharge: 2024-03-08 | DRG: 871 | Disposition: A | Discharge status: Skilled Nursing Facility | Payer: Medicare (Managed Care) | Attending: Internal Medicine | Admitting: Internal Medicine

## 2024-03-06 ENCOUNTER — Emergency Department (HOSPITAL_COMMUNITY): Payer: Medicare (Managed Care)

## 2024-03-06 ENCOUNTER — Encounter (HOSPITAL_COMMUNITY): Payer: Self-pay

## 2024-03-06 DIAGNOSIS — A419 Sepsis, unspecified organism: Secondary | ICD-10-CM | POA: Diagnosis present

## 2024-03-06 DIAGNOSIS — G9340 Encephalopathy, unspecified: Secondary | ICD-10-CM | POA: Insufficient documentation

## 2024-03-06 DIAGNOSIS — Z6835 Body mass index (BMI) 35.0-35.9, adult: Secondary | ICD-10-CM

## 2024-03-06 DIAGNOSIS — Z791 Long term (current) use of non-steroidal anti-inflammatories (NSAID): Secondary | ICD-10-CM

## 2024-03-06 DIAGNOSIS — Z1152 Encounter for screening for COVID-19: Secondary | ICD-10-CM

## 2024-03-06 DIAGNOSIS — F418 Other specified anxiety disorders: Secondary | ICD-10-CM | POA: Diagnosis present

## 2024-03-06 DIAGNOSIS — G9341 Metabolic encephalopathy: Secondary | ICD-10-CM | POA: Diagnosis present

## 2024-03-06 DIAGNOSIS — Z8249 Family history of ischemic heart disease and other diseases of the circulatory system: Secondary | ICD-10-CM | POA: Diagnosis not present

## 2024-03-06 DIAGNOSIS — E66812 Obesity, class 2: Secondary | ICD-10-CM | POA: Diagnosis present

## 2024-03-06 DIAGNOSIS — I69351 Hemiplegia and hemiparesis following cerebral infarction affecting right dominant side: Secondary | ICD-10-CM

## 2024-03-06 DIAGNOSIS — G629 Polyneuropathy, unspecified: Secondary | ICD-10-CM | POA: Diagnosis present

## 2024-03-06 DIAGNOSIS — Z96651 Presence of right artificial knee joint: Secondary | ICD-10-CM | POA: Diagnosis present

## 2024-03-06 DIAGNOSIS — J189 Pneumonia, unspecified organism: Secondary | ICD-10-CM | POA: Diagnosis not present

## 2024-03-06 DIAGNOSIS — I10 Essential (primary) hypertension: Secondary | ICD-10-CM | POA: Diagnosis present

## 2024-03-06 DIAGNOSIS — R4182 Altered mental status, unspecified: Principal | ICD-10-CM

## 2024-03-06 DIAGNOSIS — I6930 Unspecified sequelae of cerebral infarction: Secondary | ICD-10-CM

## 2024-03-06 DIAGNOSIS — Z87891 Personal history of nicotine dependence: Secondary | ICD-10-CM

## 2024-03-06 DIAGNOSIS — E872 Acidosis, unspecified: Secondary | ICD-10-CM | POA: Diagnosis present

## 2024-03-06 DIAGNOSIS — M1990 Unspecified osteoarthritis, unspecified site: Secondary | ICD-10-CM | POA: Diagnosis present

## 2024-03-06 DIAGNOSIS — E0500 Thyrotoxicosis with diffuse goiter without thyrotoxic crisis or storm: Secondary | ICD-10-CM | POA: Diagnosis present

## 2024-03-06 DIAGNOSIS — Z79899 Other long term (current) drug therapy: Secondary | ICD-10-CM | POA: Diagnosis not present

## 2024-03-06 DIAGNOSIS — Z7982 Long term (current) use of aspirin: Secondary | ICD-10-CM | POA: Diagnosis not present

## 2024-03-06 LAB — CBC WITH DIFFERENTIAL/PLATELET
Abs Immature Granulocytes: 0.08 10*3/uL — ABNORMAL HIGH (ref 0.00–0.07)
Basophils Absolute: 0 10*3/uL (ref 0.0–0.1)
Basophils Relative: 0 %
Eosinophils Absolute: 0.3 10*3/uL (ref 0.0–0.5)
Eosinophils Relative: 2 %
HCT: 39.2 % (ref 36.0–46.0)
Hemoglobin: 12.6 g/dL (ref 12.0–15.0)
Immature Granulocytes: 1 %
Lymphocytes Relative: 14 %
Lymphs Abs: 2.4 10*3/uL (ref 0.7–4.0)
MCH: 31 pg (ref 26.0–34.0)
MCHC: 32.1 g/dL (ref 30.0–36.0)
MCV: 96.3 fL (ref 80.0–100.0)
Monocytes Absolute: 0.8 10*3/uL (ref 0.1–1.0)
Monocytes Relative: 5 %
Neutro Abs: 13.4 10*3/uL — ABNORMAL HIGH (ref 1.7–7.7)
Neutrophils Relative %: 78 %
Platelets: 342 10*3/uL (ref 150–400)
RBC: 4.07 MIL/uL (ref 3.87–5.11)
RDW: 14.1 % (ref 11.5–15.5)
WBC: 17 10*3/uL — ABNORMAL HIGH (ref 4.0–10.5)
nRBC: 0 % (ref 0.0–0.2)

## 2024-03-06 LAB — URINALYSIS, W/ REFLEX TO CULTURE (INFECTION SUSPECTED)
Bilirubin Urine: NEGATIVE
Glucose, UA: NEGATIVE mg/dL
Ketones, ur: NEGATIVE mg/dL
Leukocytes,Ua: NEGATIVE
Nitrite: NEGATIVE
Protein, ur: NEGATIVE mg/dL
Specific Gravity, Urine: 1.018 (ref 1.005–1.030)
pH: 5 (ref 5.0–8.0)

## 2024-03-06 LAB — I-STAT CG4 LACTIC ACID, ED
Lactic Acid, Venous: 1.5 mmol/L (ref 0.5–1.9)
Lactic Acid, Venous: 1.7 mmol/L (ref 0.5–1.9)

## 2024-03-06 LAB — COMPREHENSIVE METABOLIC PANEL WITH GFR
ALT: 12 U/L (ref 0–44)
AST: 17 U/L (ref 15–41)
Albumin: 3.6 g/dL (ref 3.5–5.0)
Alkaline Phosphatase: 81 U/L (ref 38–126)
Anion gap: 10 (ref 5–15)
BUN: 14 mg/dL (ref 8–23)
CO2: 29 mmol/L (ref 22–32)
Calcium: 9.6 mg/dL (ref 8.9–10.3)
Chloride: 101 mmol/L (ref 98–111)
Creatinine, Ser: 0.89 mg/dL (ref 0.44–1.00)
GFR, Estimated: >60 mL/min (ref >60)
Glucose, Bld: 116 mg/dL — ABNORMAL HIGH (ref 70–99)
Potassium: 4.3 mmol/L (ref 3.5–5.1)
Sodium: 140 mmol/L (ref 135–145)
Total Bilirubin: 0.6 mg/dL (ref 0.0–1.2)
Total Protein: 7.9 g/dL (ref 6.5–8.1)

## 2024-03-06 LAB — RESP PANEL BY RT-PCR (RSV, FLU A&B, COVID)  RVPGX2
Influenza A by PCR: NEGATIVE
Influenza B by PCR: NEGATIVE
Resp Syncytial Virus by PCR: NEGATIVE
SARS Coronavirus 2 by RT PCR: NEGATIVE

## 2024-03-06 MED ORDER — SODIUM CHLORIDE 0.9 % IV SOLN
500.0000 mg | INTRAVENOUS | Status: DC
  Administered 2024-03-06: 500 mg via INTRAVENOUS
  Filled 2024-03-06: qty 5

## 2024-03-06 MED ORDER — ACETAMINOPHEN 650 MG RE SUPP: 650.0000 mg | Freq: Four times a day (QID) | RECTAL | Status: DC | PRN

## 2024-03-06 MED ORDER — METHIMAZOLE 5 MG PO TABS
5.0000 mg | ORAL_TABLET | Freq: Every day | ORAL | Status: DC
  Administered 2024-03-07 – 2024-03-08 (×2): 5 mg via ORAL
  Filled 2024-03-06 (×3): qty 1

## 2024-03-06 MED ORDER — ENOXAPARIN SODIUM 60 MG/0.6ML IJ SOSY
45.0000 mg | PREFILLED_SYRINGE | INTRAMUSCULAR | Status: DC
  Administered 2024-03-07 – 2024-03-08 (×2): 45 mg via SUBCUTANEOUS
  Filled 2024-03-06 (×2): qty 0.6

## 2024-03-06 MED ORDER — VALACYCLOVIR HCL 500 MG PO TABS
500.0000 mg | ORAL_TABLET | Freq: Every day | ORAL | Status: DC
  Administered 2024-03-07 – 2024-03-08 (×2): 500 mg via ORAL
  Filled 2024-03-06 (×3): qty 1

## 2024-03-06 MED ORDER — ACETAMINOPHEN 650 MG RE SUPP
650.0000 mg | Freq: Once | RECTAL | Status: AC
  Administered 2024-03-06: 650 mg via RECTAL
  Filled 2024-03-06: qty 1

## 2024-03-06 MED ORDER — SODIUM CHLORIDE 0.9 % IV SOLN: INTRAVENOUS | Status: DC

## 2024-03-06 MED ORDER — FERROUS SULFATE 325 (65 FE) MG PO TABS
324.0000 mg | ORAL_TABLET | Freq: Every day | ORAL | Status: DC
  Administered 2024-03-07 – 2024-03-08 (×2): 324 mg via ORAL
  Filled 2024-03-06 (×2): qty 1

## 2024-03-06 MED ORDER — ATORVASTATIN CALCIUM 10 MG PO TABS
10.0000 mg | ORAL_TABLET | Freq: Every day | ORAL | Status: DC
  Administered 2024-03-07 – 2024-03-08 (×2): 10 mg via ORAL
  Filled 2024-03-06 (×2): qty 1

## 2024-03-06 MED ORDER — SODIUM CHLORIDE 0.9 % IV SOLN
2.0000 g | INTRAVENOUS | Status: DC
  Administered 2024-03-07: 2 g via INTRAVENOUS
  Filled 2024-03-06: qty 20

## 2024-03-06 MED ORDER — TIOTROPIUM BROMIDE MONOHYDRATE 2.5 MCG/ACT IN AERS: 2.0000 | INHALATION_SPRAY | Freq: Every day | RESPIRATORY_TRACT | Status: DC

## 2024-03-06 MED ORDER — SODIUM CHLORIDE 0.9 % IV SOLN
1.0000 g | INTRAVENOUS | Status: DC
  Administered 2024-03-06: 1 g via INTRAVENOUS
  Filled 2024-03-06: qty 10

## 2024-03-06 MED ORDER — SERTRALINE HCL 50 MG PO TABS
50.0000 mg | ORAL_TABLET | Freq: Every day | ORAL | Status: DC
  Administered 2024-03-07 – 2024-03-08 (×2): 50 mg via ORAL
  Filled 2024-03-06 (×2): qty 1

## 2024-03-06 MED ORDER — ACETAMINOPHEN 325 MG PO TABS: 650.0000 mg | ORAL_TABLET | Freq: Four times a day (QID) | ORAL | Status: DC | PRN

## 2024-03-06 MED ORDER — SODIUM CHLORIDE 0.9 % IV SOLN
500.0000 mg | INTRAVENOUS | Status: DC
  Administered 2024-03-07: 500 mg via INTRAVENOUS
  Filled 2024-03-06 (×2): qty 5

## 2024-03-06 MED ORDER — GABAPENTIN 100 MG PO CAPS
100.0000 mg | ORAL_CAPSULE | Freq: Every day | ORAL | Status: DC
  Administered 2024-03-07 – 2024-03-08 (×2): 100 mg via ORAL
  Filled 2024-03-06 (×2): qty 1

## 2024-03-06 MED ORDER — ASPIRIN 81 MG PO TBEC
81.0000 mg | DELAYED_RELEASE_TABLET | Freq: Every day | ORAL | Status: DC
  Administered 2024-03-07 – 2024-03-08 (×2): 81 mg via ORAL
  Filled 2024-03-06 (×2): qty 1

## 2024-03-06 MED ORDER — VENLAFAXINE HCL ER 75 MG PO CP24
75.0000 mg | ORAL_CAPSULE | Freq: Every day | ORAL | Status: DC
  Administered 2024-03-07 – 2024-03-08 (×2): 75 mg via ORAL
  Filled 2024-03-06 (×2): qty 1

## 2024-03-06 NOTE — ED Notes (Signed)
Shift report received, assumed care of patient at this time 

## 2024-03-06 NOTE — ED Triage Notes (Signed)
 BIB EMS for altered mental status.  Daughter reports last two days patient ahs not bee herself and is usually talkative and fiesty.  Patient hx of stroke and right hand contracted.

## 2024-03-06 NOTE — ED Notes (Signed)
 Patient transported to CT

## 2024-03-06 NOTE — H&P (Signed)
 History and Physical    Jamie Haas:[id number] DOB: [birth year] DOA: 03/06/2024  Patient coming from: ***  Chief Complaint: ***  HPI: Jamie Haas is a 78 y.o. female with ***   ED Course: ***  Review of Systems: As per HPI, rest all negative.   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   Arthritis    Depression    Hypertension    Stroke [facility]) 2011   left sided weakness    Past Surgical History:  Procedure Laterality Date   BRAIN SURGERY  2011   coils inserted   HARDWARE REMOVAL Left 04/29/2016   Procedure: HARDWARE REMOVAL LEFT ANKLE;  Surgeon: Wes Hamman, MD;  Location: [facility] OR;  Service: Orthopedics;  Laterality: Left;   ORIF ANKLE FRACTURE Left 12/24/2015   Procedure: OPEN REDUCTION INTERNAL FIXATION (ORIF) ANKLE FRACTURE;  Surgeon: Wes Hamman, MD;  Location: [facility] OR;  Service: Orthopedics;  Laterality: Left;   TOTAL KNEE ARTHROPLASTY     TOTAL KNEE ARTHROPLASTY Right 04/29/2016   Procedure: RIGHT TOTAL KNEE ARTHROPLASTY;  Surgeon: Wes Hamman, MD;  Location: [facility] OR;  Service: Orthopedics;  Laterality: Right;   VULVAR LESION REMOVAL  03/17/2012   Procedure: VULVAR LESION;  Surgeon: Gabrielle Joiner, MD;  Location: [facility] ORS;  Service: Gynecology;  Laterality: N/A;  CO2 Laser Vaporization Of Condyloma     reports that she has been smoking cigarettes. She has a 7.5 pack-year smoking history. She has never used smokeless tobacco. She reports that she does not drink alcohol and does not use drugs.  No Known Allergies  Family History  Problem Relation Age of Onset   Hypertension Mother    Osteoarthritis Mother    Cancer Mother     Prior to Admission medications   Medication Sig Start Date End Date Taking? Authorizing Provider  aspirin EC 81 MG tablet Take 81 mg by mouth daily.   Yes [provider]  atorvastatin (LIPITOR) 10 MG tablet Take 10 mg by mouth daily. 06/23/23  Yes [provider]  cholecalciferol (VITAMIN D) 1000 units tablet Take 1,000 Units by mouth daily.   Yes [provider]  diclofenac  sodium (VOLTAREN ) 1 % GEL Apply 2 g topically 4 (four) times daily. Patient taking differently: Apply 2 g topically 2 (two) times daily as needed (to both shoulders for pain). 12/25/15  Yes Mayford Speaks, MD  Emollient (EUCERIN DAILY HYDRATION) CREA Apply 1 application  topically in the morning and at bedtime.   Yes [provider]  ferrous sulfate 324 MG TBEC Take 324 mg by mouth daily.   Yes [provider]  gabapentin (NEURONTIN) 100 MG capsule Take 100 mg by mouth daily. 06/11/23  Yes [provider]  melatonin 3 MG TABS tablet Take 6 mg by mouth at bedtime.   Yes [provider]  methimazole (TAPAZOLE) 5 MG tablet Take 5 mg by mouth daily.   Yes [provider]  mirtazapine (REMERON) 7.5 MG tablet Take 7.5 mg by mouth at bedtime.   Yes [provider]  sertraline (ZOLOFT) 50 MG tablet Take 50 mg by mouth daily. 06/09/23  Yes [provider]  Tiotropium Bromide Monohydrate (SPIRIVA RESPIMAT) 2.5 MCG/ACT AERS Inhale 2 puffs into the lungs daily.   Yes [provider]  valACYclovir (VALTREX) 500 MG tablet Take 500 mg by mouth daily. 06/17/23  Yes [provider]  Venlafaxine HCl 75 MG TB24 Take 1 tablet by mouth daily. 05/11/23  Yes [provider]  Zinc Oxide 10 % OINT Apply 1 application  topically in the morning and at bedtime. Apply to buttocks and groin every day and night shift. May also be applied as needed for skin irritation.   Yes [provider]    Physical Exam: Constitutional: *** Vitals:   03/06/24 2000 03/06/24 2100 03/06/24 2145 03/06/24 2200  BP: (!) 143/82 139/73 (!) 140/64 (!) 162/71  Pulse: (!) 110 (!) 101 (!) 104 94  Resp: (!) 25 20 19 19   Temp:      TempSrc:      SpO2: 94% 94% 91% 96%  Weight:      Height:       Eyes: *** ENMT:  *** Neck: *** Respiratory: *** Cardiovascular: *** Abdomen: *** Musculoskeletal: *** Skin: *** Neurologic:*** Psychiatric: ***   Labs on Admission: I have personally reviewed following labs and imaging studies  CBC: Recent Labs  Lab 03/06/24 1640  WBC 17.0*  NEUTROABS 13.4*  HGB 12.6  HCT 39.2  MCV 96.3  PLT 342   Basic Metabolic Panel: Recent Labs  Lab 03/06/24 1640  NA 140  K 4.3  CL 101  CO2 29  GLUCOSE 116*  BUN 14  CREATININE 0.89  CALCIUM 9.6   GFR: Estimated Creatinine Clearance: 62.5 mL/min (by C-G formula based on SCr of 0.89 mg/dL). Liver Function Tests: Recent Labs  Lab 03/06/24 1640  AST 17  ALT 12  ALKPHOS 81  BILITOT 0.6  PROT 7.9  ALBUMIN 3.6   No results for input(s): "LIPASE", "AMYLASE" in the last 168 hours. No results for input(s): "AMMONIA" in the last 168 hours. Coagulation Profile: No results for input(s): "INR", "PROTIME" in the last 168 hours. Cardiac Enzymes: No results for input(s): "CKTOTAL", "CKMB", "CKMBINDEX", "TROPONINI" in the last 168 hours. BNP (last 3 results) No results for input(s): "PROBNP" in the last 8760 hours. HbA1C: No results for input(s): "HGBA1C" in the last 72 hours. CBG: No results for input(s): "GLUCAP" in the last 168 hours. Lipid Profile: No results for input(s): "CHOL", "HDL", "LDLCALC", "TRIG", "CHOLHDL", "LDLDIRECT" in the last 72 hours. Thyroid  Function Tests: No results for input(s): "TSH", "T4TOTAL", "FREET4", "T3FREE", "THYROIDAB" in the last 72 hours. Anemia Panel: No results for input(s): "VITAMINB12", "FOLATE", "FERRITIN", "TIBC", "IRON", "RETICCTPCT" in the last 72 hours. Urine analysis:    Component Value Date/Time   COLORURINE YELLOW 03/06/2024 1711   APPEARANCEUR CLEAR 03/06/2024 1711   LABSPEC 1.018 03/06/2024 1711   PHURINE 5.0 03/06/2024 1711   GLUCOSEU NEGATIVE 03/06/2024 1711   HGBUR SMALL (A) 03/06/2024 1711   BILIRUBINUR NEGATIVE 03/06/2024 1711   KETONESUR NEGATIVE  03/06/2024 1711   PROTEINUR NEGATIVE 03/06/2024 1711   UROBILINOGEN 0.2 06/06/2010 0958   NITRITE NEGATIVE 03/06/2024 1711   LEUKOCYTESUR NEGATIVE 03/06/2024 1711   Sepsis Labs: @LABRCNTIP (procalcitonin:4,lacticidven:4) ) Recent Results (from the past 240 hours)  Resp panel by RT-PCR (RSV, Flu A&B, Covid) Anterior Nasal Swab     Status: None   Collection Time: 03/06/24 10:09 PM   Specimen: Anterior Nasal Swab  Result Value Ref Range Status   SARS Coronavirus 2 by RT PCR NEGATIVE NEGATIVE Final   Influenza A by PCR NEGATIVE NEGATIVE Final   Influenza B by PCR NEGATIVE NEGATIVE Final    Comment: (NOTE) The Xpert Xpress SARS-CoV-2/FLU/RSV plus assay is intended as an aid in the diagnosis of influenza from Nasopharyngeal swab specimens and should not  be used as a sole basis for treatment. Nasal washings and aspirates are unacceptable for Xpert Xpress SARS-CoV-2/FLU/RSV testing.  Fact Sheet for Patients: BloggerCourse.com  Fact Sheet for Healthcare Providers: SeriousBroker.it  This test is not yet approved or cleared by the [place]  FDA and has been authorized for detection and/or diagnosis of SARS-CoV-2 by FDA under an Emergency Use Authorization (EUA). This EUA will remain in effect (meaning this test can be used) for the duration of the COVID-19 declaration under Section 564(b)(1) of the Act, 21 U.S.C. section 360bbb-3(b)(1), unless the authorization is terminated or revoked.     Resp Syncytial Virus by PCR NEGATIVE NEGATIVE Final    Comment: (NOTE) Fact Sheet for Patients: BloggerCourse.com  Fact Sheet for Healthcare Providers: SeriousBroker.it  This test is not yet approved or cleared by the [place]  FDA and has been authorized for detection and/or diagnosis of SARS-CoV-2 by FDA under an Emergency Use Authorization (EUA). This EUA will remain in effect (meaning  this test can be used) for the duration of the COVID-19 declaration under Section 564(b)(1) of the Act, 21 U.S.C. section 360bbb-3(b)(1), unless the authorization is terminated or revoked.  Performed at [facility], [address], [place], [place] [address]      Radiological Exams on Admission: CT ABDOMEN PELVIS WO CONTRAST Result Date: 03/06/2024 EXAM: CT ABDOMEN AND PELVIS WITHOUT CONTRAST 03/06/2024 09:16:00 PM TECHNIQUE: CT of the abdomen and pelvis was performed without the administration of intravenous contrast. Multiplanar reformatted images are provided for review. Automated exposure control, iterative reconstruction, and/or weight based adjustment of the mA/kV was utilized to reduce the radiation dose to as low as reasonably achievable. COMPARISON: None available. CLINICAL HISTORY: Abdominal pain, acute, nonlocalized. FINDINGS: LOWER CHEST: Dominant 19 mm irregular lingular nodule (image 9) with additional peribronchovascular nodularity in the posterior right lower lobe (image 1) and mild patchy dependent opacities. This appearance favors multifocal pneumonia possibly with superimposed mild atelectasis. Trace bilateral pleural effusions. HEPATOBILIARY: Gallbladder wall calcifications. SPLEEN: No acute abnormality. PANCREAS: No acute abnormality. ADRENAL GLANDS: No acute abnormality. KIDNEYS, URETERS AND BLADDER: No stones in the kidneys or ureters. No evidence of hydronephrosis. No evidence of perinephric or periureteral stranding. Urinary bladder is unremarkable. GI AND BOWEL: Stomach demonstrates no acute abnormality. There is no evidence of bowel obstruction. No evidence of appendicitis. Normal appendix (image 49). PERITONEUM AND RETROPERITONEUM: No evidence of ascites. No free air. Aorta is normal in caliber. LYMPH NODES: No evidence of lymphadenopathy. REPRODUCTIVE ORGANS: No acute abnormality. BONES AND SOFT TISSUES: Mild superior endplate deformity with schmorl's node at L3. Grade  1 anterolisthesis of L5 on S1. No focal soft tissue abnormality. LIMITATIONS/ARTIFACTS: Motion degraded images. IMPRESSION: 1. Multifocal pneumonia with superimposed mild atelectasis. Trace bilateral pleural effusions. 2. Dominant lingular nodule is favored to be infectious but warrants attention on follow-up. Consider follow-up CT chest in 4-6 weeks. 3. No CT findings to account for the patient's abdominal pain. Electronically signed by: Zadie Herter MD 03/06/2024 09:25 PM EDT RP Workstation: [id number]   CT Head Wo Contrast Result Date: 03/06/2024 CLINICAL DATA:  Altered mental status. EXAM: CT HEAD WITHOUT CONTRAST TECHNIQUE: Contiguous axial images were obtained from the base of the skull through the vertex without intravenous contrast. RADIATION DOSE REDUCTION: This exam was performed according to the departmental dose-optimization program which includes automated exposure control, adjustment of the mA and/or kV according to patient size and/or use of iterative reconstruction technique. COMPARISON:  Head CT dated 02/26/2005. FINDINGS: Brain: Mild age-related atrophy and  advanced chronic microvascular ischemic changes. Areas of old infarct and encephalomalacia involving the bilateral frontal lobes and right occipital lobe. There is no acute intracranial hemorrhage. No mass effect or midline shift. No extra-axial fluid collection. Vascular: Right ICA vascular clips with associated artifact. Skull: No acute calvarial pathology. Sinuses/Orbits: Diffuse mucoperiosteal thickening of paranasal sinuses. No air-fluid level. The mastoid air cells are clear. Other: None IMPRESSION: 1. No acute intracranial pathology. 2. Age-related atrophy and chronic microvascular ischemic changes and old infarcts. Electronically Signed   By: Angus Bark M.D.   On: 03/06/2024 16:59   DG Chest Port 1 View Result Date: 03/06/2024 CLINICAL DATA:  Cough, altered mental status EXAM: PORTABLE CHEST 1 VIEW COMPARISON:  12/21/2015  FINDINGS: Prominent reverse lordotic projection, with the chin and facial tissues obscuring the medial lung apices. The patient is rotated to the right on today's radiograph, reducing diagnostic sensitivity and specificity. Atherosclerotic calcification of the aortic arch. Low lung volumes are present, causing crowding of the pulmonary vasculature. No blunting of the costophrenic angles. Indistinct nodularity the left lung base measuring 1.4 cm in diameter may be from superimposition of vascular and osseous shadows as well as the nipple shadow or perhaps a component of the adjacent ECG lead snap, but a true left basilar pulmonary nodule is not excluded. Consider assessment with repeat chest radiography or chest CT. IMPRESSION: 1. Reduced sensitivity due to low lung volumes, rotation, and substantial reverse lordotic projection. 2. Indistinct nodularity at the left lung base measuring 1.4 cm in diameter may be from superimposition of vascular and osseous shadows as well as the nipple shadow or perhaps a component of the adjacent ECG lead snap, but a true left basilar pulmonary nodule is not excluded. Consider assessment with repeat chest radiography or chest CT. 3. Aortic Atherosclerosis (ICD10-I70.0). Electronically Signed   By: Freida Jes M.D.   On: 03/06/2024 16:14    EKG: Independently reviewed. ***  Assessment/Plan Principal Problem:   Multifocal pneumonia Active Problems:   Essential hypertension   Depression with anxiety   History of stroke with residual deficit   Graves disease   Acute encephalopathy    ***   DVT prophylaxis: *** Code Status: ***  Family Communication: ***  Disposition Plan: ***  Consults called: ***  Admission status: ***

## 2024-03-06 NOTE — ED Provider Notes (Addendum)
 [facility] AT [facility] Provider Note   CSN: [id number] Arrival date & time: 03/06/24  1456     History  Chief Complaint  Patient presents with   Altered Mental Status    Jamie Haas is a 78 y.o. female.  78 year old female presents from home due to altered mental status.  According to EMS, daughter states that patient has not been as active as she normally is.  Does have a prior history of stroke with right-sided deficits.  Patient herself denies any headache, chest pain, shortness of breath.  Denies recent fever or chills.  No urinary symptoms.       Home Medications Prior to Admission medications   Medication Sig Start Date End Date Taking? Authorizing Provider  acetaminophen  (TYLENOL ) 500 MG tablet Take 500 mg by mouth 2 (two) times daily as needed for mild pain.    [provider]  aspirin 325 MG tablet Take 325 mg by mouth daily.    [provider]  atorvastatin (LIPITOR) 10 MG tablet Take 10 mg by mouth daily. 06/23/23   [provider]  cholecalciferol (VITAMIN D) 1000 units tablet Take 1,000 Units by mouth daily.    [provider]  diclofenac  sodium (VOLTAREN ) 1 % GEL Apply 2 g topically 4 (four) times daily. 12/25/15   Mayford Speaks, MD  escitalopram (LEXAPRO) 10 MG tablet Take 10 mg by mouth daily.    [provider]  gabapentin (NEURONTIN) 100 MG capsule Take 100 mg by mouth daily. 06/11/23   [provider]  hydrOXYzine (ATARAX/VISTARIL) 25 MG tablet Take 25 mg by mouth at bedtime.    [provider]  loratadine (CLARITIN) 10 MG tablet Take 10 mg by mouth daily.    [provider]  losartan -hydrochlorothiazide  (HYZAAR) 100-12.5 MG tablet Take 1 tablet by mouth daily.    [provider]  meloxicam (MOBIC) 7.5 MG tablet Take 7.5 mg by mouth daily.    [provider]  methimazole (TAPAZOLE) 5 MG tablet Take 5 mg by mouth daily.    [provider]  methocarbamol  (ROBAXIN ) 750 MG tablet Take 1 tablet (750 mg total) by mouth 2 (two) times daily as needed for muscle spasms. 04/29/16   Wes Hamman, MD  mirtazapine (REMERON) 7.5 MG tablet Take 7.5 mg by mouth at bedtime.    [provider]  montelukast  (SINGULAIR ) 10 MG tablet Take 10 mg by mouth daily.    [provider]  MYRBETRIQ  25 MG TB24 tablet Take 25 mg by mouth daily. 10/14/15   [provider]  ondansetron  (ZOFRAN ) 4 MG tablet Take 4 mg by mouth every 8 (eight) hours as needed for nausea or vomiting.    [provider]  oxyCODONE  (OXYCONTIN ) 10 mg 12 hr tablet Take 1 tablet (10 mg total) by mouth every 12 (twelve) hours. 04/29/16   Wes Hamman, MD  polyethylene glycol (MIRALAX  / GLYCOLAX ) packet Take 17 g by mouth daily as needed for mild constipation.    [provider]  senna-docusate (SENOKOT S) 8.6-50 MG tablet Take 1 tablet by mouth at bedtime as needed. 04/29/16   Wes Hamman, MD  sertraline (ZOLOFT) 50 MG tablet Take 50 mg by mouth daily. 06/09/23   [provider]  valACYclovir (VALTREX) 500 MG tablet Take 500 mg by mouth daily. 06/17/23   [provider]  Venlafaxine HCl 75 MG TB24 Take 1 tablet by mouth daily. 05/11/23   [provider]      Allergies    Patient has no known allergies.    Review of Systems   Review of Systems  Unable to perform ROS: Mental status change    Physical Exam Updated Vital Signs BP 128/61   Pulse (!) 111   Temp 99.4 F (37.4 C) (Oral)   Resp (!) 24   Ht 1.651 m (5\' 5" )   Wt 95.7 kg   SpO2 92%   BMI 35.11 kg/m  Physical Exam Vitals and nursing note reviewed.  Constitutional:      General: She is not in acute distress.    Appearance: Normal appearance. She is well-developed. She is not toxic-appearing.  HENT:     Head: Normocephalic and atraumatic.  Eyes:     General: Lids are normal.     Conjunctiva/sclera: Conjunctivae normal.     Pupils:  Pupils are equal, round, and reactive to light.  Neck:     Thyroid : No thyroid  mass.     Trachea: No tracheal deviation.  Cardiovascular:     Rate and Rhythm: Normal rate and regular rhythm.     Heart sounds: Normal heart sounds. No murmur heard.    No gallop.  Pulmonary:     Effort: Pulmonary effort is normal. No respiratory distress.     Breath sounds: Normal breath sounds. No stridor. No decreased breath sounds, wheezing, rhonchi or rales.  Abdominal:     General: There is no distension.     Palpations: Abdomen is soft.     Tenderness: There is no abdominal tenderness. There is no rebound.  Musculoskeletal:        General: No tenderness. Normal range of motion.     Cervical back: Normal range of motion and neck supple.  Skin:    General: Skin is warm and dry.     Findings: No abrasion or rash.  Neurological:     Mental Status: She is alert. Mental status is at baseline. She is disoriented.     GCS: GCS eye subscore is 4. GCS verbal subscore is 5. GCS motor subscore is 6.     Cranial Nerves: No cranial nerve deficit.     Sensory: No sensory deficit.     Motor: Weakness present.     Comments: Patient withdraws to pain on left upper left lower extremity no facial symmetry appreciated.  Right upper extremity with minimal movement.  Psychiatric:        Attention and Perception: Attention normal.        Mood and Affect: Affect is blunt.     ED Results / Procedures / Treatments   Labs (all labs ordered are listed, but only abnormal results are displayed) Labs Reviewed  CULTURE, BLOOD (ROUTINE X 2)  CULTURE, BLOOD (ROUTINE X 2)  CBC WITH DIFFERENTIAL/PLATELET  COMPREHENSIVE METABOLIC PANEL WITH GFR  URINALYSIS, W/ REFLEX TO CULTURE (INFECTION SUSPECTED)  I-STAT CG4 LACTIC ACID, ED    EKG EKG Interpretation Date/Time:  Tuesday March 06 2024 16:32:18 EDT Ventricular Rate:  96 PR Interval:  149 QRS Duration:  105 QT Interval:  361 QTC Calculation: 457 R  Axis:   29  Text Interpretation: Sinus rhythm Low voltage, precordial leads Abnormal R-wave progression, early transition Abnormal inferior Q waves Minimal ST elevation, inferior leads Artifact No significant change since last tracing Confirmed by Lind Repine ([id number]) on 03/06/2024 5:41:36 PM  Radiology No results found.  Procedures Procedures    Medications Ordered in ED Medications  0.9 %  sodium chloride  infusion (has no administration in time range)    ED Course/ Medical Decision Making/ A&P                                 Medical Decision Making Amount and/or Complexity of Data Reviewed Labs: ordered. Radiology: ordered. ECG/medicine tests: ordered.  Risk OTC drugs. Prescription drug management.   Patient EKG which shows sinus rhythm.  Interpretation.  She is afebrile here confirmed by rectal temperature.  Urinalysis negative for infection.  Had a head CT due to altered neurostatus which showed no acute changes.  Chest x-ray does show a 1.4 cm nodule potentially in the left lung base.  Patient does have a mild leukocytosis of 17,000.  Repeat abdominal exam shows slight tenderness although difficult due to her dementia.  Abdominal CT was negative for intra-abdominal process but does shows multifocal pneumonia.  Repeat rectal temp is 99.7.  Patient given Tylenol  rectally for this.  I will attempt to notify family again . patient had a normal earlier lactate.  Will repeat.  Will start on antibiotics for pneumonia.  Will admit to the hospitalist team  CRITICAL CARE Performed by: Eden Goodpasture Total critical care time: 55 minutes Critical care time was exclusive of separately billable procedures and treating other patients. Critical care was necessary to treat or prevent imminent or life-threatening deterioration. Critical care was time spent personally by me on the following activities: development of treatment plan with patient and/or surrogate as well as nursing,  discussions with consultants, evaluation of patient's response to treatment, examination of patient, obtaining history from patient or surrogate, ordering and performing treatments and interventions, ordering and review of laboratory studies, ordering and review of radiographic studies, pulse oximetry and re-evaluation of patient's condition.     Final Clinical Impression(s) / ED Diagnoses Final diagnoses:  None    Rx / DC Orders ED Discharge Orders     None         Lind Repine, MD 03/06/24 1940    Lind Repine, MD 03/06/24 2131    Lind Repine, MD 03/06/24 2132

## 2024-03-07 DIAGNOSIS — J189 Pneumonia, unspecified organism: Secondary | ICD-10-CM | POA: Diagnosis not present

## 2024-03-07 LAB — CBC
HCT: 33.2 % — ABNORMAL LOW (ref 36.0–46.0)
HCT: 37.2 % (ref 36.0–46.0)
Hemoglobin: 10.2 g/dL — ABNORMAL LOW (ref 12.0–15.0)
Hemoglobin: 11.7 g/dL — ABNORMAL LOW (ref 12.0–15.0)
MCH: 30.8 pg (ref 26.0–34.0)
MCH: 30.7 pg (ref 26.0–34.0)
MCHC: 30.7 g/dL (ref 30.0–36.0)
MCHC: 31.5 g/dL (ref 30.0–36.0)
MCV: 100.3 fL — ABNORMAL HIGH (ref 80.0–100.0)
MCV: 97.6 fL (ref 80.0–100.0)
Platelets: 208 10*3/uL (ref 150–400)
Platelets: 289 10*3/uL (ref 150–400)
RBC: 3.31 MIL/uL — ABNORMAL LOW (ref 3.87–5.11)
RBC: 3.81 MIL/uL — ABNORMAL LOW (ref 3.87–5.11)
RDW: 14.5 % (ref 11.5–15.5)
RDW: 14.6 % (ref 11.5–15.5)
WBC: 10.1 10*3/uL (ref 4.0–10.5)
WBC: 11 10*3/uL — ABNORMAL HIGH (ref 4.0–10.5)
nRBC: 0 % (ref 0.0–0.2)
nRBC: 0 % (ref 0.0–0.2)

## 2024-03-07 LAB — COMPREHENSIVE METABOLIC PANEL WITH GFR
ALT: 9 U/L (ref 0–44)
AST: 19 U/L (ref 15–41)
Albumin: 3.1 g/dL — ABNORMAL LOW (ref 3.5–5.0)
Alkaline Phosphatase: 72 U/L (ref 38–126)
Anion gap: 9 (ref 5–15)
BUN: 13 mg/dL (ref 8–23)
CO2: 26 mmol/L (ref 22–32)
Calcium: 8.5 mg/dL — ABNORMAL LOW (ref 8.9–10.3)
Chloride: 106 mmol/L (ref 98–111)
Creatinine, Ser: 1 mg/dL (ref 0.44–1.00)
GFR, Estimated: 59 mL/min — ABNORMAL LOW (ref >60)
Glucose, Bld: 87 mg/dL (ref 70–99)
Potassium: 4.1 mmol/L (ref 3.5–5.1)
Sodium: 141 mmol/L (ref 135–145)
Total Bilirubin: 0.6 mg/dL (ref 0.0–1.2)
Total Protein: 7.2 g/dL (ref 6.5–8.1)

## 2024-03-07 LAB — TSH: TSH: 1.856 u[IU]/mL (ref 0.350–4.500)

## 2024-03-07 LAB — I-STAT CG4 LACTIC ACID, ED: Lactic Acid, Venous: 2.2 mmol/L (ref 0.5–1.9)

## 2024-03-07 LAB — T4, FREE: Free T4: 0.62 ng/dL (ref 0.61–1.12)

## 2024-03-07 MED ORDER — UMECLIDINIUM BROMIDE 62.5 MCG/ACT IN AEPB
1.0000 | INHALATION_SPRAY | Freq: Every day | RESPIRATORY_TRACT | Status: DC
  Administered 2024-03-07 – 2024-03-08 (×2): 1 via RESPIRATORY_TRACT
  Filled 2024-03-07 (×2): qty 7

## 2024-03-07 MED ORDER — ALBUTEROL SULFATE (2.5 MG/3ML) 0.083% IN NEBU
2.5000 mg | INHALATION_SOLUTION | RESPIRATORY_TRACT | Status: DC | PRN
  Administered 2024-03-08: 2.5 mg via RESPIRATORY_TRACT
  Filled 2024-03-07: qty 3

## 2024-03-07 MED ORDER — MIRTAZAPINE 15 MG PO TABS
7.5000 mg | ORAL_TABLET | Freq: Every day | ORAL | Status: DC
Start: 2024-03-07 — End: 2024-03-08
  Administered 2024-03-07: 7.5 mg via ORAL
  Filled 2024-03-07: qty 1

## 2024-03-07 NOTE — Plan of Care (Signed)
  Problem: Activity: Goal: Ability to tolerate increased activity will improve Outcome: Progressing   Problem: Clinical Measurements: Goal: Ability to maintain a body temperature in the normal range will improve Outcome: Progressing   Problem: Respiratory: Goal: Ability to maintain adequate ventilation will improve Outcome: Progressing Goal: Ability to maintain a clear airway will improve Outcome: Progressing   

## 2024-03-07 NOTE — Evaluation (Signed)
 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   Arthritis    Depression    Hypertension    Stroke [facility]) 2011   left sided weakness   Past Surgical History:  Past Surgical History:  Procedure Laterality Date   BRAIN SURGERY  2011   coils inserted   HARDWARE REMOVAL Left 04/29/2016   Procedure: HARDWARE REMOVAL LEFT ANKLE;  Surgeon: Wes Hamman, MD;  Location: [facility] OR;  Service: Orthopedics;  Laterality: Left;   ORIF ANKLE FRACTURE Left 12/24/2015   Procedure: OPEN REDUCTION INTERNAL FIXATION (ORIF) ANKLE FRACTURE;  Surgeon: Wes Hamman, MD;  Location: [facility] OR;  Service: Orthopedics;  Laterality: Left;   TOTAL KNEE ARTHROPLASTY     TOTAL KNEE ARTHROPLASTY Right 04/29/2016   Procedure: RIGHT TOTAL KNEE ARTHROPLASTY;  Surgeon: Wes Hamman, MD;  Location: [facility] OR;  Service: Orthopedics;  Laterality: Right;   VULVAR LESION REMOVAL  03/17/2012   Procedure: VULVAR LESION;  Surgeon: Gabrielle Joiner, MD;  Location: [facility] ORS;  Service: Gynecology;  Laterality: N/A;  CO2 Laser Vaporization Of Condyloma   HPI:  Jamie Haas is a 78 yo female presenting to ED 4/29 with AMS x2 days. CT shows multifocal pneumonia with superimposed mild atelectasis. MBS 2002 s/p SAH shows delayed swallow initiation without penetration/aspiration per radiologist report (unable to access SLP notes). PMH includes prior CVA with residual R sided deficits, Grave's disease, depression    Assessment / Plan / Recommendation  Clinical Impression  Pt's family present to confirm that pt consumes a regular diet without difficulty. She is edentulous but her dentures have been misplaced,  although she states she is still able to masticate regular solids. Overall, oral transit and mastication were swift and complete with purees and solids. No s/s of aspiration were observed with thin liquids or during the 3 oz water test. Discussed options for diet modification considering absent dentures with pt's family, who state they would prefer her meals come in bite-size pieces. Will downgrade diet to Dys 3 solids and continue thin liquids. No further SLP f/u is clinically indicated acutely, will sign off. SLP Visit Diagnosis: Dysphagia, unspecified (R13.10)    Aspiration Risk  Mild aspiration risk    Diet Recommendation Dysphagia 3 (Mech soft);Thin liquid    Liquid Administration via: Cup;Straw Medication Administration: Whole meds with liquid Supervision: Staff to assist with self feeding Compensations: Slow rate;Small sips/bites Postural Changes: Seated upright at 90 degrees    Other  Recommendations Oral Care Recommendations: Oral care BID    Recommendations for follow up therapy are one component of a multi-disciplinary discharge planning process, led by the attending physician.  Recommendations may be updated based on patient status, additional functional criteria and insurance authorization.  Follow up Recommendations No SLP follow up      Assistance Recommended at Discharge    Functional Status Assessment Patient has not had a recent decline in their functional status  Frequency and Duration            Prognosis Prognosis for improved oropharyngeal function: Good Barriers to Reach Goals: Cognitive deficits      Swallow Study   General HPI:  Jamie Haas is a 78 yo female presenting to ED 4/29 with AMS x2 days. CT shows multifocal pneumonia with superimposed mild atelectasis. MBS 2002 s/p SAH shows delayed swallow initiation without penetration/aspiration per radiologist report (unable to access SLP notes). PMH includes prior CVA with residual R sided deficits,  Grave's disease, depression Type of Study: Bedside Swallow Evaluation Previous Swallow Assessment: see HPI Diet Prior to this Study: Regular;Thin liquids (Level 0) Temperature Spikes Noted: No Respiratory Status: Nasal cannula History of Recent Intubation: No Behavior/Cognition: Alert;Cooperative;Pleasant mood Oral Cavity Assessment: Excessive secretions Oral Care Completed by SLP: No Oral Cavity - Dentition: Edentulous;Dentures, not available Vision: Functional for self-feeding Self-Feeding Abilities: Needs assist Patient Positioning: Upright in bed Baseline Vocal Quality: Normal Volitional Cough: Strong;Congested Volitional Swallow: Able to elicit    Oral/Motor/Sensory Function Overall Oral Motor/Sensory Function: Within functional limits   Ice Chips Ice chips: Not tested   Thin Liquid Thin Liquid: Within functional limits Presentation: Straw    Nectar Thick Nectar Thick Liquid: Not tested   Honey Thick Honey Thick Liquid: Not tested   Puree Puree: Within functional limits Presentation: Spoon   Solid     Solid: Within functional limits      Amil Kale, M.A., CCC-SLP Speech Language Pathology, Acute Rehabilitation Services  Secure Chat preferred [phone]  03/07/2024,11:07 AM

## 2024-03-07 NOTE — Hospital Course (Signed)
 Jamie Haas is a 78 y.o. female with history of stroke, Graves' disease, depression was brought into the hospital with increasing confusion and not acting herself and decreased interaction for 2 days prior to the presentation.  In the ED patient was noted to have a fever of 99 F with leukocytosis.  Urinalysis was negative for infection.  Lactate was elevated at 2.2.  Chest x-ray showed nonspecific findings and  a lung nodule.  CT abdomen pelvis was done with concern for possible abdominal tenderness which shows multifocal pneumonia.  COVID and flu test were negative.  Blood cultures were drawn and started on empiric antibiotics for pneumonia.  Patient was then considered for admission to the hospital for further evaluation and treatment.    Sepsis secondary to multifocal pneumonia On presentation patient was tachycardic and tachypneic with leukocytosis and CT scan showing multifocal infiltrates with elevated lactate suggestive of sepsis.  On Rocephin and Zithromax.  Will follow blood cultures negative less than 12 hours.  Get speech and swallow evaluation.    Acute metabolic encephalopathy likely from fever and pneumonia.  MRI has been ordered due to previous history of stroke.  Graves' disease with hyperthyroidism recent free T4 was 0.8 and TSH is 3.8 and free T3 was 2.3 in February 2025.  On methimazole.   Seen by endocrinology as outpatient.  TSH of 1.8 at this time.  History of stroke on aspirin and statins.  Depression on Effexor Remeron and Zoloft.  Peripheral neuropathy on gabapentin.

## 2024-03-07 NOTE — Progress Notes (Signed)
 PROGRESS NOTE  Jamie Haas:[id number] DOB: [birth year] DOA: 03/06/2024 PCP: Tracey Friday., MD   LOS: 1 day   Brief narrative:  Jamie Haas is a 78 y.o. female with history of stroke, Graves' disease, depression was brought into the hospital with increasing confusion and not acting herself and decreased interaction for 2 days prior to the presentation.  In the ED patient was noted to have a fever of 99 F with leukocytosis.  Urinalysis was negative for infection.  Lactate was elevated at 2.2.  Chest x-ray showed nonspecific findings and  a lung nodule.  CT abdomen pelvis was done with concern for possible abdominal tenderness which shows multifocal pneumonia.  COVID and flu test were negative.  Blood cultures were drawn and started on empiric antibiotics for pneumonia.  Patient was then considered for admission to the hospital for further evaluation and treatment.    Assessment/Plan: Principal Problem:   Multifocal pneumonia Active Problems:   Essential hypertension   Depression with anxiety   History of stroke with residual deficit   Graves disease   Acute encephalopathy  Sepsis secondary to multifocal pneumonia On presentation patient was tachycardic and tachypneic with leukocytosis and CT scan showing multifocal infiltrates with elevated lactate suggestive of sepsis.  On Rocephin and Zithromax.  Will follow blood cultures negative less than 12 hours.  Get speech and swallow evaluation.  Mild leukocytosis today.  Temperature max of 99.7 F.  On 2 L of oxygen by nasal cannula.  On dysphagia 3 diet.  Will discontinue IV fluids.  Patient is normally not on oxygen at baseline.  Acute metabolic encephalopathy likely from fever and pneumonia.  MRI has been ordered due to previous history of stroke.  Graves' disease with hyperthyroidism recent free T4 was 0.8 and TSH is 3.8 and free T3 was 2.3 in February 2025.  On methimazole.   Seen by endocrinology as outpatient.  TSH of  1.8 at this time.  History of stroke on aspirin and statins.  Will continue.  Depression on Effexor Remeron and Zoloft.  Peripheral neuropathy on gabapentin.  Debility weakness.  Will get PT OT evaluation.  Patient from [facility] is close to facility.  DVT prophylaxis: Lovenox  subcu   Disposition: Will get PT OT evaluation.  Patient states that she is from skilled nursing facility.  Status is: Inpatient Remains inpatient appropriate because: IV antibiotic, pending clinical improvement, pneumonia,    Code Status:     Code Status: Full Code  Family Communication: Spoke with the patient's 2 daughters at bedside  Consultants: None  Procedures: None  Anti-infectives:  Rocephin and Zithromax  Anti-infectives (From admission, onward)    Start     Dose/Rate Route Frequency Ordered Stop   03/07/24 2100  cefTRIAXone (ROCEPHIN) 2 g in sodium chloride  0.9 % 100 mL IVPB        2 g 200 mL/hr over 30 Minutes Intravenous Every 24 hours 03/06/24 2345 03/12/24 2059   03/07/24 2100  azithromycin (ZITHROMAX) 500 mg in sodium chloride  0.9 % 250 mL IVPB        500 mg 250 mL/hr over 60 Minutes Intravenous Every 24 hours 03/06/24 2345 03/12/24 2059   03/07/24 1000  valACYclovir (VALTREX) tablet 500 mg        500 mg Oral Daily 03/06/24 2344     03/06/24 2130  azithromycin (ZITHROMAX) 500 mg in sodium chloride  0.9 % 250 mL IVPB  Status:  Discontinued        500 mg  250 mL/hr over 60 Minutes Intravenous Every 24 hours 03/06/24 2129 03/07/24 0752   03/06/24 2130  cefTRIAXone (ROCEPHIN) 1 g in sodium chloride  0.9 % 100 mL IVPB  Status:  Discontinued        1 g 200 mL/hr over 30 Minutes Intravenous Every 24 hours 03/06/24 2129 03/07/24 0752       Subjective: Today, patient was seen and examined at bedside.  Patient states that she feels better.  Has some cough but denies any dyspnea or shortness of breath, nausea vomiting fever chills or rigor.  Had something to eat.  Patient's daughter  is at bedside.  Objective: Vitals:   03/07/24 0615 03/07/24 0807  BP: 114/64 (!) 141/80  Pulse: 77 76  Resp: 17 18  Temp: 98 F (36.7 C) 97.6 F (36.4 C)  SpO2: 100% 100%   No intake or output data in the 24 hours ending 03/07/24 1107 Filed Weights   03/06/24 1506  Weight: 95.7 kg   Body mass index is 35.11 kg/m.   Physical Exam: GENERAL: Patient is alert awake and oriented. Not in obvious distress.  On nasal cannula oxygen, obese build.,  Elderly female HENT: No scleral pallor or icterus. Pupils equally reactive to light. Oral mucosa is moist NECK: is supple, no gross swelling noted. CHEST:  No crackles or wheezes.  Diminished breath sounds bilaterally.  No obvious wheezes noted. CVS: S1 and S2 heard, no murmur. Regular rate and rhythm.  ABDOMEN: Soft, non-tender, bowel sounds are present. EXTREMITIES: Trace peripheral edema noted. CNS: Cranial nerves are intact. No focal motor deficits.  Moves all extremities. SKIN: warm and dry without rashes.  Data Review: I have personally reviewed the following laboratory data and studies,  CBC: Recent Labs  Lab 03/06/24 1640 03/07/24 0432 03/07/24 0643  WBC 17.0* 10.1 11.0*  NEUTROABS 13.4*  --   --   HGB 12.6 10.2* 11.7*  HCT 39.2 33.2* 37.2  MCV 96.3 100.3* 97.6  PLT 342 208 289   Basic Metabolic Panel: Recent Labs  Lab 03/06/24 1640 03/07/24 0643  NA 140 141  K 4.3 4.1  CL 101 106  CO2 29 26  GLUCOSE 116* 87  BUN 14 13  CREATININE 0.89 1.00  CALCIUM 9.6 8.5*   Liver Function Tests: Recent Labs  Lab 03/06/24 1640 03/07/24 0643  AST 17 19  ALT 12 9  ALKPHOS 81 72  BILITOT 0.6 0.6  PROT 7.9 7.2  ALBUMIN 3.6 3.1*   No results for input(s): "LIPASE", "AMYLASE" in the last 168 hours. No results for input(s): "AMMONIA" in the last 168 hours. Cardiac Enzymes: No results for input(s): "CKTOTAL", "CKMB", "CKMBINDEX", "TROPONINI" in the last 168 hours. BNP (last 3 results) No results for input(s): "BNP" in  the last 8760 hours.  ProBNP (last 3 results) No results for input(s): "PROBNP" in the last 8760 hours.  CBG: No results for input(s): "GLUCAP" in the last 168 hours. Recent Results (from the past 240 hours)  Culture, blood (Routine X 2) w Reflex to ID Panel     Status: None (Preliminary result)   Collection Time: 03/06/24  4:40 PM   Specimen: BLOOD  Result Value Ref Range Status   Specimen Description BLOOD SITE NOT SPECIFIED  Final   Special Requests   Final    BOTTLES DRAWN AEROBIC AND ANAEROBIC Blood Culture results may not be optimal due to an inadequate volume of blood received in culture bottles   Culture   Final    NO  GROWTH < 12 HOURS Performed at [facility], [address], [place], [place] [address]    Report Status PENDING  Incomplete  Resp panel by RT-PCR (RSV, Flu A&B, Covid) Anterior Nasal Swab     Status: None   Collection Time: 03/06/24 10:09 PM   Specimen: Anterior Nasal Swab  Result Value Ref Range Status   SARS Coronavirus 2 by RT PCR NEGATIVE NEGATIVE Final   Influenza A by PCR NEGATIVE NEGATIVE Final   Influenza B by PCR NEGATIVE NEGATIVE Final    Comment: (NOTE) The Xpert Xpress SARS-CoV-2/FLU/RSV plus assay is intended as an aid in the diagnosis of influenza from Nasopharyngeal swab specimens and should not be used as a sole basis for treatment. Nasal washings and aspirates are unacceptable for Xpert Xpress SARS-CoV-2/FLU/RSV testing.  Fact Sheet for Patients: BloggerCourse.com  Fact Sheet for Healthcare Providers: SeriousBroker.it  This test is not yet approved or cleared by the [place]  FDA and has been authorized for detection and/or diagnosis of SARS-CoV-2 by FDA under an Emergency Use Authorization (EUA). This EUA will remain in effect (meaning this test can be used) for the duration of the COVID-19 declaration under Section 564(b)(1) of the Act, 21 U.S.C. section 360bbb-3(b)(1),  unless the authorization is terminated or revoked.     Resp Syncytial Virus by PCR NEGATIVE NEGATIVE Final    Comment: (NOTE) Fact Sheet for Patients: BloggerCourse.com  Fact Sheet for Healthcare Providers: SeriousBroker.it  This test is not yet approved or cleared by the [place]  FDA and has been authorized for detection and/or diagnosis of SARS-CoV-2 by FDA under an Emergency Use Authorization (EUA). This EUA will remain in effect (meaning this test can be used) for the duration of the COVID-19 declaration under Section 564(b)(1) of the Act, 21 U.S.C. section 360bbb-3(b)(1), unless the authorization is terminated or revoked.  Performed at [facility], [address] [place], [place] [address]      Studies: CT ABDOMEN PELVIS WO CONTRAST Result Date: 03/06/2024 EXAM: CT ABDOMEN AND PELVIS WITHOUT CONTRAST 03/06/2024 09:16:00 PM TECHNIQUE: CT of the abdomen and pelvis was performed without the administration of intravenous contrast. Multiplanar reformatted images are provided for review. Automated exposure control, iterative reconstruction, and/or weight based adjustment of the mA/kV was utilized to reduce the radiation dose to as low as reasonably achievable. COMPARISON: None available. CLINICAL HISTORY: Abdominal pain, acute, nonlocalized. FINDINGS: LOWER CHEST: Dominant 19 mm irregular lingular nodule (image 9) with additional peribronchovascular nodularity in the posterior right lower lobe (image 1) and mild patchy dependent opacities. This appearance favors multifocal pneumonia possibly with superimposed mild atelectasis. Trace bilateral pleural effusions. HEPATOBILIARY: Gallbladder wall calcifications. SPLEEN: No acute abnormality. PANCREAS: No acute abnormality. ADRENAL GLANDS: No acute abnormality. KIDNEYS, URETERS AND BLADDER: No stones in the kidneys or ureters. No evidence of hydronephrosis. No evidence of perinephric  or periureteral stranding. Urinary bladder is unremarkable. GI AND BOWEL: Stomach demonstrates no acute abnormality. There is no evidence of bowel obstruction. No evidence of appendicitis. Normal appendix (image 49). PERITONEUM AND RETROPERITONEUM: No evidence of ascites. No free air. Aorta is normal in caliber. LYMPH NODES: No evidence of lymphadenopathy. REPRODUCTIVE ORGANS: No acute abnormality. BONES AND SOFT TISSUES: Mild superior endplate deformity with schmorl's node at L3. Grade 1 anterolisthesis of L5 on S1. No focal soft tissue abnormality. LIMITATIONS/ARTIFACTS: Motion degraded images. IMPRESSION: 1. Multifocal pneumonia with superimposed mild atelectasis. Trace bilateral pleural effusions. 2. Dominant lingular nodule is favored to be infectious but warrants attention on  follow-up. Consider follow-up CT chest in 4-6 weeks. 3. No CT findings to account for the patient's abdominal pain. Electronically signed by: Zadie Herter MD 03/06/2024 09:25 PM EDT RP Workstation: [id number]   CT Head Wo Contrast Result Date: 03/06/2024 CLINICAL DATA:  Altered mental status. EXAM: CT HEAD WITHOUT CONTRAST TECHNIQUE: Contiguous axial images were obtained from the base of the skull through the vertex without intravenous contrast. RADIATION DOSE REDUCTION: This exam was performed according to the departmental dose-optimization program which includes automated exposure control, adjustment of the mA and/or kV according to patient size and/or use of iterative reconstruction technique. COMPARISON:  Head CT dated 02/26/2005. FINDINGS: Brain: Mild age-related atrophy and advanced chronic microvascular ischemic changes. Areas of old infarct and encephalomalacia involving the bilateral frontal lobes and right occipital lobe. There is no acute intracranial hemorrhage. No mass effect or midline shift. No extra-axial fluid collection. Vascular: Right ICA vascular clips with associated artifact. Skull: No acute calvarial  pathology. Sinuses/Orbits: Diffuse mucoperiosteal thickening of paranasal sinuses. No air-fluid level. The mastoid air cells are clear. Other: None IMPRESSION: 1. No acute intracranial pathology. 2. Age-related atrophy and chronic microvascular ischemic changes and old infarcts. Electronically Signed   By: Angus Bark M.D.   On: 03/06/2024 16:59   DG Chest Port 1 View Result Date: 03/06/2024 CLINICAL DATA:  Cough, altered mental status EXAM: PORTABLE CHEST 1 VIEW COMPARISON:  12/21/2015 FINDINGS: Prominent reverse lordotic projection, with the chin and facial tissues obscuring the medial lung apices. The patient is rotated to the right on today's radiograph, reducing diagnostic sensitivity and specificity. Atherosclerotic calcification of the aortic arch. Low lung volumes are present, causing crowding of the pulmonary vasculature. No blunting of the costophrenic angles. Indistinct nodularity the left lung base measuring 1.4 cm in diameter may be from superimposition of vascular and osseous shadows as well as the nipple shadow or perhaps a component of the adjacent ECG lead snap, but a true left basilar pulmonary nodule is not excluded. Consider assessment with repeat chest radiography or chest CT. IMPRESSION: 1. Reduced sensitivity due to low lung volumes, rotation, and substantial reverse lordotic projection. 2. Indistinct nodularity at the left lung base measuring 1.4 cm in diameter may be from superimposition of vascular and osseous shadows as well as the nipple shadow or perhaps a component of the adjacent ECG lead snap, but a true left basilar pulmonary nodule is not excluded. Consider assessment with repeat chest radiography or chest CT. 3. Aortic Atherosclerosis (ICD10-I70.0). Electronically Signed   By: Freida Jes M.D.   On: 03/06/2024 16:14      Rosena Conradi, MD  [facility] 03/07/2024  If 7PM-7AM, please contact night-coverage

## 2024-03-08 DIAGNOSIS — J189 Pneumonia, unspecified organism: Secondary | ICD-10-CM | POA: Diagnosis not present

## 2024-03-08 LAB — CBC
HCT: 38 % (ref 36.0–46.0)
Hemoglobin: 12 g/dL (ref 12.0–15.0)
MCH: 30.8 pg (ref 26.0–34.0)
MCHC: 31.6 g/dL (ref 30.0–36.0)
MCV: 97.4 fL (ref 80.0–100.0)
Platelets: 272 10*3/uL (ref 150–400)
RBC: 3.9 MIL/uL (ref 3.87–5.11)
RDW: 14.2 % (ref 11.5–15.5)
WBC: 9.2 10*3/uL (ref 4.0–10.5)
nRBC: 0 % (ref 0.0–0.2)

## 2024-03-08 LAB — BASIC METABOLIC PANEL WITH GFR
Anion gap: 11 (ref 5–15)
BUN: 10 mg/dL (ref 8–23)
CO2: 24 mmol/L (ref 22–32)
Calcium: 9 mg/dL (ref 8.9–10.3)
Chloride: 107 mmol/L (ref 98–111)
Creatinine, Ser: 0.82 mg/dL (ref 0.44–1.00)
GFR, Estimated: >60 mL/min (ref >60)
Glucose, Bld: 94 mg/dL (ref 70–99)
Potassium: 4.5 mmol/L (ref 3.5–5.1)
Sodium: 142 mmol/L (ref 135–145)

## 2024-03-08 LAB — MAGNESIUM: Magnesium: 1.8 mg/dL (ref 1.7–2.4)

## 2024-03-08 MED ORDER — CEFUROXIME AXETIL 500 MG PO TABS
500.0000 mg | ORAL_TABLET | Freq: Two times a day (BID) | ORAL | Status: AC
Start: 2024-03-08 — End: 2024-03-11

## 2024-03-08 MED ORDER — DOCUSATE SODIUM 100 MG PO CAPS
100.0000 mg | ORAL_CAPSULE | Freq: Two times a day (BID) | ORAL | Status: DC
  Administered 2024-03-08: 100 mg via ORAL
  Filled 2024-03-08: qty 1

## 2024-03-08 MED ORDER — AZITHROMYCIN 500 MG PO TABS
500.0000 mg | ORAL_TABLET | Freq: Every day | ORAL | Status: AC
Start: 2024-03-08 — End: 2024-03-11

## 2024-03-08 MED ORDER — ALBUTEROL SULFATE HFA 108 (90 BASE) MCG/ACT IN AERS
2.0000 | INHALATION_SPRAY | Freq: Four times a day (QID) | RESPIRATORY_TRACT | Status: AC | PRN
Start: 2024-03-08 — End: ?

## 2024-03-08 NOTE — Evaluation (Signed)
 Occupational Therapy Evaluation Patient Details Name: Jamie Haas MRN: [id number] DOB: [birth year] Today's Date: 03/08/2024   History of Present Illness   Pt is 78 yo female who presented on 03/06/24 with increased confusion, fever, tachycardia, tachypnea, and leukocytosis. Pt with sepsis due to multifocal PNA.  PMH: HTN, anxiety, CVA with residual weakness, graves disease     Clinical Impressions Pt resting comfortably in bed, feeling better than admission. Pt states she is LTC resident at [facility], grossly A/O, off by date by 1 week. Pt currently at baseline function, reports PLOF uses hoyer for transfers to w/c, max-total A for dressing/bathing at bed level, able to basic grooming and self feed with set up sitting in w/c or bed level. Pt has history of stroke and LUE deficits, little to no functional use of LUE. Recommending return to postacute rehab <3hrs/day, no further acute OT needs. Plan is to return to [facility] with [facility] soon     If plan is discharge home, recommend the following:   Two people to help with walking and/or transfers;A lot of help with bathing/dressing/bathroom;Assistance with cooking/housework;Assist for transportation;Help with stairs or ramp for entrance     Functional Status Assessment   Patient has had a recent decline in their functional status and demonstrates the ability to make significant improvements in function in a reasonable and predictable amount of time.     Equipment Recommendations   None recommended by OT     Recommendations for Other Services         Precautions/Restrictions   Precautions Precautions: Fall Recall of Precautions/Restrictions: Intact Restrictions Weight Bearing Restrictions Per Provider Order: No     Mobility Bed Mobility Overal bed mobility: Needs Assistance Bed Mobility: Sidelying to Sit, Sit to Supine   Sidelying to sit: Max assist, +2 for physical assistance, HOB elevated, Used rails    Sit to supine: Total assist, +2 for physical assistance   General bed mobility comments: Pt max/total A for in/out of bed, assist for BLEs, rolling onto side. Pt has difficulty bending her knees and no functional use of LUE.    Transfers Overall transfer level: Needs assistance                 General transfer comment: did not attempt, uses hoyer at SNF      Balance Overall balance assessment: Needs assistance Sitting-balance support: No upper extremity supported, Feet supported Sitting balance-Leahy Scale: Poor Sitting balance - Comments: able to sit EOB unsupported, some verbal cueing to maintain balance due to posterior lean, has difficulty bending at knees to get feet onto floor, obesity limits leaning forward and sitting up straight. Postural control: Posterior lean                                 ADL either performed or assessed with clinical judgement   ADL Overall ADL's : At baseline;Needs assistance/impaired Eating/Feeding: Set up;Bed level   Grooming: Set up;Bed level   Upper Body Bathing: Maximal assistance;Sitting;Bed level   Lower Body Bathing: Total assistance;Sitting/lateral leans;Bed level   Upper Body Dressing : Maximal assistance;Sitting   Lower Body Dressing: Total assistance;Bed level       Toileting- Clothing Manipulation and Hygiene: Total assistance;Bed level         General ADL Comments: Pt toal A for LB ADLs, transfers with hoyer at SNF. Pt max A for UB dressing/bathing. Able to complete feeding and grooming with  set up,     Vision Baseline Vision/History: 1 Wears glasses Ability to See in Adequate Light: 0 Adequate Patient Visual Report: No change from baseline;Other (comment) (L side deficit but has good visual scanning)       Perception         Praxis         Pertinent Vitals/Pain Pain Assessment Pain Assessment: No/denies pain     Extremity/Trunk Assessment Upper Extremity Assessment Upper Extremity  Assessment: LUE deficits/detail;RUE deficits/detail RUE Deficits / Details: R shoulder stiffness, but able to use functionally for ADLs as needed. RUE Coordination: decreased gross motor LUE Deficits / Details: history of stroke with L side deficits, frozen shoulder, elbow flexion contracture, hand flexion contracture at MCP. No functional use of LUE LUE Coordination: decreased fine motor;decreased gross motor   Lower Extremity Assessment Lower Extremity Assessment: Defer to PT evaluation       Communication     Cognition Arousal: Alert Behavior During Therapy: WFL for tasks assessed/performed Cognition: No apparent impairments             OT - Cognition Comments: A/Ox4, fully participates in therapy                 Following commands: Intact       Cueing  General Comments   Cueing Techniques: Verbal cues      Exercises     Shoulder Instructions      Home Living Family/patient expects to be discharged to:: Skilled nursing facility                                 Additional Comments: From SNF, Pt reports [facility], plans to return      Prior Functioning/Environment Prior Level of Function : Needs assist             Mobility Comments: Pt reports using hoyer for transfers      OT Problem List: Decreased strength;Decreased range of motion;Impaired balance (sitting and/or standing);Impaired tone;Obesity;Impaired UE functional use   OT Treatment/Interventions:        OT Goals(Current goals can be found in the care plan section)   Acute Rehab OT Goals Patient Stated Goal: to return to SNF OT Goal Formulation: With patient Time For Goal Achievement: 03/22/24 Potential to Achieve Goals: Good   OT Frequency:       Co-evaluation PT/OT/SLP Co-Evaluation/Treatment: Yes Reason for Co-Treatment: Complexity of the patient's impairments (multi-system involvement);For patient/therapist safety;To address functional/ADL transfers   OT  goals addressed during session: ADL's and self-care      AM-PAC OT "6 Clicks" Daily Activity     Outcome Measure Help from another person eating meals?: A Little Help from another person taking care of personal grooming?: A Little Help from another person toileting, which includes using toliet, bedpan, or urinal?: Total Help from another person bathing (including washing, rinsing, drying)?: Total Help from another person to put on and taking off regular upper body clothing?: A Lot Help from another person to put on and taking off regular lower body clothing?: Total 6 Click Score: 11   End of Session Nurse Communication: Mobility status  Activity Tolerance: Patient tolerated treatment well Patient left: in bed;with call bell/phone within reach  OT Visit Diagnosis: Muscle weakness (generalized) (M62.81);Hemiplegia and hemiparesis Hemiplegia - Right/Left: Left Hemiplegia - dominant/non-dominant: Non-Dominant                Time: 4098-1191  OT Time Calculation (min): 34 min Charges:  OT General Charges $OT Visit: 1 Visit OT Evaluation $OT Eval Moderate Complexity: 1 [address] OTR/L   Scherry Curtis 03/08/2024, 11:47 AM

## 2024-03-08 NOTE — Discharge Summary (Signed)
 Physician Discharge Summary  Jamie Haas:[id number] DOB: 12/30/1945 DOA: 03/06/2024  PCP: Tracey Friday., MD  Admit date: 03/06/2024 Discharge date: 03/08/2024  Admitted From: Skilled nursing facility  Discharge disposition: Skilled nursing facility   Recommendations for Outpatient Follow-Up:   Follow up with your primary care provider at the skilled nursing facility in 3 to 5 days Check CBC, BMP, magnesium  in the next visit Complete the course of antibiotic.  Discharge Diagnosis:   Principal Problem:   Multifocal pneumonia Active Problems:   Essential hypertension   Depression with anxiety   History of stroke with residual deficit   Graves disease   Acute encephalopathy   Discharge Condition: Improved.  Diet recommendation: Soft diet  Wound care: None.  Code status: Full.   History of Present Illness:   Jamie Haas is a 78 y.o. female with history of stroke, Graves' disease, depression was brought into the hospital with increasing confusion and not acting herself and decreased interaction for 2 days prior to the presentation.  In the ED patient was noted to have a fever of 99 F with leukocytosis.  Urinalysis was negative for infection.  Lactate was elevated at 2.2.  Chest x-ray showed nonspecific findings and  a lung nodule.  CT abdomen pelvis was done with concern for possible abdominal tenderness which shows multifocal pneumonia.  COVID and flu test were negative.  Blood cultures were drawn and started on empiric antibiotics for pneumonia.  Patient was then considered for admission to the hospital for further evaluation and treatment.   Hospital Course:   Following conditions were addressed during hospitalization as listed below,  Sepsis secondary to multifocal pneumonia Resolved sepsis at this time.  On presentation patient was tachycardic and tachypneic with leukocytosis and CT scan showing multifocal infiltrates with elevated lactate  suggestive of sepsis.  During hospitalization patient received IV Rocephin  and Zithromax .  Blood cultures negative in 2 days.  Speech therapy recommended dysphagia 3 diet.  Temperature max of 98.4 F.  No leukocytosis.  Patient required 2 L of oxygen during hospitalization.  Continue to wean oxygen  Acute metabolic encephalopathy likely from fever and pneumonia.  Resolved at this time.  Graves' disease with hyperthyroidism recent free T4 was 0.8 and TSH is 3.8 and free T3 was 2.3 in February 2025.  On methimazole .   Seen by endocrinology as outpatient.  TSH of 1.8 at this time.  Recommend follow-up with endocrinology as outpatient.   History of stroke on aspirin  and statins.  Will continue.   Depression on Effexor  Remeron  and Zoloft .   Peripheral neuropathy on gabapentin .   Debility weakness.  Patient from skilled nursing facility.  Class II obesity.  Body mass index is 35.11 kg/m. Patient would benefit from ongoing weight loss as outpatient.  Disposition.  At this time, patient is stable for disposition to skilled nursing facility.  Medical Consultants:   None.  Procedures:    None Subjective:   Today, patient was seen and examined at bedside.  Denies any interval complaints.  Continues to feel better.  Breathing okay.  Has no cough or chest pain.  Discharge Exam:   Vitals:   03/08/24 0823 03/08/24 0937  BP: 122/73   Pulse: 71   Resp: 18   Temp: 98 F (36.7 C)   SpO2: 100% 95%   Vitals:   03/07/24 2147 03/08/24 0555 03/08/24 0823 03/08/24 0937  BP: 136/68 126/83 122/73   Pulse: 94 77 71   Resp: 20 (!) 23  18   Temp: (!) 97.3 F (36.3 C) 97.7 F (36.5 C) 98 F (36.7 C)   TempSrc: Oral Oral    SpO2: 100% 100% 100% 95%  Weight:      Height:       Body mass index is 35.11 kg/m.   General: Alert awake, not in obvious distress, obese built, on room air HENT: pupils equally reacting to light,  No scleral pallor or icterus noted. Oral mucosa is moist.  Chest:     Diminished breath sounds bilaterally. No crackles or wheezes.  CVS: S1 &S2 heard. No murmur.  Regular rate and rhythm. Abdomen: Soft, nontender, nondistended.  Bowel sounds are heard.   Extremities: No cyanosis, clubbing or edema.  Peripheral pulses are palpable. Psych: Alert, awake and oriented, normal mood CNS:  No cranial nerve deficits.  Moves all extremities Skin: Warm and dry.  No rashes noted.  The results of significant diagnostics from this hospitalization (including imaging, microbiology, ancillary and laboratory) are listed below for reference.     Diagnostic Studies:   CT ABDOMEN PELVIS WO CONTRAST Result Date: 03/06/2024 EXAM: CT ABDOMEN AND PELVIS WITHOUT CONTRAST 03/06/2024 09:16:00 PM TECHNIQUE: CT of the abdomen and pelvis was performed without the administration of intravenous contrast. Multiplanar reformatted images are provided for review. Automated exposure control, iterative reconstruction, and/or weight based adjustment of the mA/kV was utilized to reduce the radiation dose to as low as reasonably achievable. COMPARISON: None available. CLINICAL HISTORY: Abdominal pain, acute, nonlocalized. FINDINGS: LOWER CHEST: Dominant 19 mm irregular lingular nodule (image 9) with additional peribronchovascular nodularity in the posterior right lower lobe (image 1) and mild patchy dependent opacities. This appearance favors multifocal pneumonia possibly with superimposed mild atelectasis. Trace bilateral pleural effusions. HEPATOBILIARY: Gallbladder wall calcifications. SPLEEN: No acute abnormality. PANCREAS: No acute abnormality. ADRENAL GLANDS: No acute abnormality. KIDNEYS, URETERS AND BLADDER: No stones in the kidneys or ureters. No evidence of hydronephrosis. No evidence of perinephric or periureteral stranding. Urinary bladder is unremarkable. GI AND BOWEL: Stomach demonstrates no acute abnormality. There is no evidence of bowel obstruction. No evidence of appendicitis. Normal appendix  (image 49). PERITONEUM AND RETROPERITONEUM: No evidence of ascites. No free air. Aorta is normal in caliber. LYMPH NODES: No evidence of lymphadenopathy. REPRODUCTIVE ORGANS: No acute abnormality. BONES AND SOFT TISSUES: Mild superior endplate deformity with schmorl's node at L3. Grade 1 anterolisthesis of L5 on S1. No focal soft tissue abnormality. LIMITATIONS/ARTIFACTS: Motion degraded images. IMPRESSION: 1. Multifocal pneumonia with superimposed mild atelectasis. Trace bilateral pleural effusions. 2. Dominant lingular nodule is favored to be infectious but warrants attention on follow-up. Consider follow-up CT chest in 4-6 weeks. 3. No CT findings to account for the patient's abdominal pain. Electronically signed by: Zadie Herter MD 03/06/2024 09:25 PM EDT RP Workstation: [id number]   CT Head Wo Contrast Result Date: 03/06/2024 CLINICAL DATA:  Altered mental status. EXAM: CT HEAD WITHOUT CONTRAST TECHNIQUE: Contiguous axial images were obtained from the base of the skull through the vertex without intravenous contrast. RADIATION DOSE REDUCTION: This exam was performed according to the departmental dose-optimization program which includes automated exposure control, adjustment of the mA and/or kV according to patient size and/or use of iterative reconstruction technique. COMPARISON:  Head CT dated 02/26/2005. FINDINGS: Brain: Mild age-related atrophy and advanced chronic microvascular ischemic changes. Areas of old infarct and encephalomalacia involving the bilateral frontal lobes and right occipital lobe. There is no acute intracranial hemorrhage. No mass effect or midline shift. No extra-axial fluid collection. Vascular: Right ICA  vascular clips with associated artifact. Skull: No acute calvarial pathology. Sinuses/Orbits: Diffuse mucoperiosteal thickening of paranasal sinuses. No air-fluid level. The mastoid air cells are clear. Other: None IMPRESSION: 1. No acute intracranial pathology. 2. Age-related  atrophy and chronic microvascular ischemic changes and old infarcts. Electronically Signed   By: Angus Bark M.D.   On: 03/06/2024 16:59   DG Chest Port 1 View Result Date: 03/06/2024 CLINICAL DATA:  Cough, altered mental status EXAM: PORTABLE CHEST 1 VIEW COMPARISON:  12/21/2015 FINDINGS: Prominent reverse lordotic projection, with the chin and facial tissues obscuring the medial lung apices. The patient is rotated to the right on today's radiograph, reducing diagnostic sensitivity and specificity. Atherosclerotic calcification of the aortic arch. Low lung volumes are present, causing crowding of the pulmonary vasculature. No blunting of the costophrenic angles. Indistinct nodularity the left lung base measuring 1.4 cm in diameter may be from superimposition of vascular and osseous shadows as well as the nipple shadow or perhaps a component of the adjacent ECG lead snap, but a true left basilar pulmonary nodule is not excluded. Consider assessment with repeat chest radiography or chest CT. IMPRESSION: 1. Reduced sensitivity due to low lung volumes, rotation, and substantial reverse lordotic projection. 2. Indistinct nodularity at the left lung base measuring 1.4 cm in diameter may be from superimposition of vascular and osseous shadows as well as the nipple shadow or perhaps a component of the adjacent ECG lead snap, but a true left basilar pulmonary nodule is not excluded. Consider assessment with repeat chest radiography or chest CT. 3. Aortic Atherosclerosis (ICD10-I70.0). Electronically Signed   By: Freida Jes M.D.   On: 03/06/2024 16:14     Labs:   Basic Metabolic Panel: Recent Labs  Lab 03/06/24 1640 03/07/24 0643 03/08/24 0540  NA 140 141 142  K 4.3 4.1 4.5  CL 101 106 107  CO2 29 26 24   GLUCOSE 116* 87 94  BUN 14 13 10   CREATININE 0.89 1.00 0.82  CALCIUM  9.6 8.5* 9.0  MG  --   --  1.8   GFR Estimated Creatinine Clearance: 67.8 mL/min (by C-G formula based on SCr of  0.82 mg/dL). Liver Function Tests: Recent Labs  Lab 03/06/24 1640 03/07/24 0643  AST 17 19  ALT 12 9  ALKPHOS 81 72  BILITOT 0.6 0.6  PROT 7.9 7.2  ALBUMIN 3.6 3.1*   No results for input(s): "LIPASE", "AMYLASE" in the last 168 hours. No results for input(s): "AMMONIA" in the last 168 hours. Coagulation profile No results for input(s): "INR", "PROTIME" in the last 168 hours.  CBC: Recent Labs  Lab 03/06/24 1640 03/07/24 0432 03/07/24 0643 03/08/24 0540  WBC 17.0* 10.1 11.0* 9.2  NEUTROABS 13.4*  --   --   --   HGB 12.6 10.2* 11.7* 12.0  HCT 39.2 33.2* 37.2 38.0  MCV 96.3 100.3* 97.6 97.4  PLT 342 208 289 272   Cardiac Enzymes: No results for input(s): "CKTOTAL", "CKMB", "CKMBINDEX", "TROPONINI" in the last 168 hours. BNP: Invalid input(s): "POCBNP" CBG: No results for input(s): "GLUCAP" in the last 168 hours. D-Dimer No results for input(s): "DDIMER" in the last 72 hours. Hgb A1c No results for input(s): "HGBA1C" in the last 72 hours. Lipid Profile No results for input(s): "CHOL", "HDL", "LDLCALC", "TRIG", "CHOLHDL", "LDLDIRECT" in the last 72 hours. Thyroid  function studies Recent Labs    03/07/24 0432  TSH 1.856   Anemia work up No results for input(s): "VITAMINB12", "FOLATE", "FERRITIN", "TIBC", "IRON", "RETICCTPCT" in the  last 72 hours. Microbiology Recent Results (from the past 240 hours)  Culture, blood (Routine X 2) w Reflex to ID Panel     Status: None (Preliminary result)   Collection Time: 03/06/24  4:40 PM   Specimen: BLOOD  Result Value Ref Range Status   Specimen Description BLOOD SITE NOT SPECIFIED  Final   Special Requests   Final    BOTTLES DRAWN AEROBIC AND ANAEROBIC Blood Culture results may not be optimal due to an inadequate volume of blood received in culture bottles   Culture   Final    NO GROWTH 2 DAYS Performed at [facility], [address], [place], [place] [address]    Report Status PENDING  Incomplete  Resp panel by  RT-PCR (RSV, Flu A&B, Covid) Anterior Nasal Swab     Status: None   Collection Time: 03/06/24 10:09 PM   Specimen: Anterior Nasal Swab  Result Value Ref Range Status   SARS Coronavirus 2 by RT PCR NEGATIVE NEGATIVE Final   Influenza A by PCR NEGATIVE NEGATIVE Final   Influenza B by PCR NEGATIVE NEGATIVE Final    Comment: (NOTE) The Xpert Xpress SARS-CoV-2/FLU/RSV plus assay is intended as an aid in the diagnosis of influenza from Nasopharyngeal swab specimens and should not be used as a sole basis for treatment. Nasal washings and aspirates are unacceptable for Xpert Xpress SARS-CoV-2/FLU/RSV testing.  Fact Sheet for Patients: BloggerCourse.com  Fact Sheet for Healthcare Providers: SeriousBroker.it  This test is not yet approved or cleared by the [place]  FDA and has been authorized for detection and/or diagnosis of SARS-CoV-2 by FDA under an Emergency Use Authorization (EUA). This EUA will remain in effect (meaning this test can be used) for the duration of the COVID-19 declaration under Section 564(b)(1) of the Act, 21 U.S.C. section 360bbb-3(b)(1), unless the authorization is terminated or revoked.     Resp Syncytial Virus by PCR NEGATIVE NEGATIVE Final    Comment: (NOTE) Fact Sheet for Patients: BloggerCourse.com  Fact Sheet for Healthcare Providers: SeriousBroker.it  This test is not yet approved or cleared by the [place]  FDA and has been authorized for detection and/or diagnosis of SARS-CoV-2 by FDA under an Emergency Use Authorization (EUA). This EUA will remain in effect (meaning this test can be used) for the duration of the COVID-19 declaration under Section 564(b)(1) of the Act, 21 U.S.C. section 360bbb-3(b)(1), unless the authorization is terminated or revoked.  Performed at [facility], [address] [place], [place] [address]       Discharge Instructions:   Discharge Instructions     Diet - low sodium heart healthy   Complete by: As directed    Discharge instructions   Complete by: As directed    Complete course of antibiotic.  Use oxygen 2 L/min to keep pulse ox of more than 90% if needed.  Follow-up with primary care provider at the skilled nursing facility in 3 to 5 days.  Check blood work at that time.  Seek medical attention for worsening symptoms.   Increase activity slowly   Complete by: As directed       Allergies as of 03/08/2024   No Known Allergies      Medication List     TAKE these medications    albuterol  108 (90 Base) MCG/ACT inhaler Commonly known as: VENTOLIN  HFA Inhale 2 puffs into the lungs every 6 (six) hours as needed for wheezing or shortness of breath.   aspirin  EC 81 MG  tablet Take 81 mg by mouth daily.   atorvastatin  10 MG tablet Commonly known as: LIPITOR Take 10 mg by mouth daily.   azithromycin  500 MG tablet Commonly known as: Zithromax  Take 1 tablet (500 mg total) by mouth daily for 3 days.   cefUROXime  500 MG tablet Commonly known as: CEFTIN  Take 1 tablet (500 mg total) by mouth 2 (two) times daily with a meal for 3 days.   cholecalciferol 1000 units tablet Commonly known as: VITAMIN D Take 1,000 Units by mouth daily.   diclofenac  sodium 1 % Gel Commonly known as: VOLTAREN  Apply 2 g topically 4 (four) times daily. What changed:  when to take this reasons to take this   Eucerin Daily Hydration Crea Apply 1 application  topically in the morning and at bedtime.   ferrous sulfate  324 MG Tbec Take 324 mg by mouth daily.   gabapentin  100 MG capsule Commonly known as: NEURONTIN  Take 100 mg by mouth daily.   melatonin 3 MG Tabs tablet Take 6 mg by mouth at bedtime.   methimazole  5 MG tablet Commonly known as: TAPAZOLE  Take 5 mg by mouth daily.   mirtazapine  7.5 MG tablet Commonly known as: REMERON  Take 7.5 mg by mouth at bedtime.   sertraline   50 MG tablet Commonly known as: ZOLOFT  Take 50 mg by mouth daily.   Spiriva  Respimat 2.5 MCG/ACT Aers Generic drug: Tiotropium Bromide  Monohydrate Inhale 2 puffs into the lungs daily.   valACYclovir  500 MG tablet Commonly known as: VALTREX  Take 500 mg by mouth daily.   Venlafaxine  HCl 75 MG Tb24 Take 1 tablet by mouth daily.   Zinc Oxide 10 % Oint Apply 1 application  topically in the morning and at bedtime. Apply to buttocks and groin every day and night shift. May also be applied as needed for skin irritation.          Time coordinating discharge: 39 minutes  Signed:  Lilli Dewald  [facility] 03/08/2024, 10:04 AM

## 2024-03-08 NOTE — Progress Notes (Signed)
 Discharge order received. Patient returning to [facility]. Report called to April. All questions answered at that time.

## 2024-03-08 NOTE — TOC Transition Note (Signed)
 Transition of Care [facility]) - Discharge Note   Patient Details  Name: Jamie Haas MRN: [id number] Date of Birth: 04/02/1946  [facility] CM/SW Contact:  Jeffory M[place], [place] Phone Number: 03/08/2024, 11:43 AM   Clinical Narrative: Pt for dc back to [facility] where she is a LTC resident. Spoke to [place] in [facility] admissions who confirmed pt able to return to room 162. Pt's dtr Melba Spittle [phone] aware of dc and reports agreeable. RN provided with number for report and [facility] arranged for transport. SW signing off at dc.   Paullette Boston, MSW, LCSW [phone] (coverage)        Final next level of care: Skilled Nursing Facility Barriers to Discharge: Barriers Resolved   Patient Goals and CMS Choice            Discharge Placement              Patient chooses bed at: Other - please specify in the comment section below: [facility]) Patient to be transferred to facility by: [facility] Name of family member notified: Tabitha/Dtr Patient and family notified of of transfer: 03/08/24  Discharge Plan and Services Additional resources added to the After Visit Summary for                                       Social Drivers of Health (SDOH) Interventions SDOH Screenings   Food Insecurity: No Food Insecurity (03/07/2024)  Housing: Low Risk  (03/07/2024)  Transportation Needs: No Transportation Needs (03/07/2024)  Utilities: Not At Risk (03/07/2024)  Social Connections: Moderately Integrated (03/07/2024)  Tobacco Use: High Risk (03/06/2024)     Readmission Risk Interventions     No data to display

## 2024-03-08 NOTE — Plan of Care (Signed)
  Problem: Activity: Goal: Ability to tolerate increased activity will improve Outcome: Adequate for Discharge   Problem: Clinical Measurements: Goal: Ability to maintain a body temperature in the normal range will improve Outcome: Adequate for Discharge   Problem: Respiratory: Goal: Ability to maintain adequate ventilation will improve Outcome: Adequate for Discharge Goal: Ability to maintain a clear airway will improve Outcome: Adequate for Discharge   

## 2024-03-08 NOTE — Care Management Important Message (Signed)
 Important Message  Patient Details  Name: Jamie Haas MRN: [id number] Date of Birth: 1946/03/13   Important Message Given:  Yes - Medicare IM     Wynonia Hedges 03/08/2024, 3:29 PM

## 2024-03-08 NOTE — Evaluation (Signed)
 Physical Therapy Evaluation and Discharge Patient Details Name: Jamie Haas MRN: [id number] DOB: [birth year] Today's Date: 03/08/2024  History of Present Illness  Pt is 78 yo female who presented on 03/06/24 with increased confusion, fever, tachycardia, tachypnea, and leukocytosis. Pt with sepsis due to multifocal PNA.  PMH: HTN, anxiety, CVA with residual weakness, graves disease  Clinical Impression  Patient evaluated by Physical Therapy with no further acute PT needs identified. All education has been completed and the patient has no further questions. Pt from SNF, was using hoyer to get to w/c and seems to have been nonambulatory for several years. Pt required max A +2 to come to EOB and return to supine. Needed assist to maintain unsupported sitting EOB. Very stiff B knees. No acute PT at this time due to pt at baseline LOF and return to SNF.  See below for any follow-up Physical Therapy or equipment needs. PT is signing off. Thank you for this referral.         If plan is discharge home, recommend the following: Two people to help with walking and/or transfers;Two people to help with bathing/dressing/bathroom   Can travel by private vehicle   No    Equipment Recommendations None recommended by PT  Recommendations for Other Services       Functional Status Assessment Patient has not had a recent decline in their functional status     Precautions / Restrictions Precautions Precautions: Fall Recall of Precautions/Restrictions: Intact Restrictions Weight Bearing Restrictions Per Provider Order: No      Mobility  Bed Mobility Overal bed mobility: Needs Assistance Bed Mobility: Sidelying to Sit, Sit to Supine   Sidelying to sit: Max assist, +2 for physical assistance, HOB elevated, Used rails   Sit to supine: Total assist, +2 for physical assistance   General bed mobility comments: Pt max/total A for in/out of bed, assist for BLEs, rolling onto side. Pt has difficulty  bending her knees and no functional use of LUE.    Transfers Overall transfer level: Needs assistance                 General transfer comment: did not attempt, uses hoyer at SNF    Ambulation/Gait               General Gait Details: non ambulatory  Stairs            Wheelchair Mobility     Tilt Bed    Modified Rankin (Stroke Patients Only)       Balance Overall balance assessment: Needs assistance Sitting-balance support: No upper extremity supported, Feet supported Sitting balance-Leahy Scale: Poor Sitting balance - Comments: able to sit EOB unsupported, some verbal cueing to maintain balance due to posterior lean, has difficulty bending at knees to get feet onto floor, obesity limits leaning forward and sitting up straight. Postural control: Posterior lean                                   Pertinent Vitals/Pain      Home Living Family/patient expects to be discharged to:: Skilled nursing facility                   Additional Comments: From SNF, Pt reports [facility], plans to return    Prior Function Prior Level of Function : Needs assist             Mobility Comments: Pt  reports using hoyer for transfers ADLs Comments: assist needed     Extremity/Trunk Assessment   Upper Extremity Assessment Upper Extremity Assessment: Defer to OT evaluation RUE Deficits / Details: R shoulder stiffness, but able to use functionally for ADLs as needed. RUE Coordination: decreased gross motor LUE Deficits / Details: history of stroke with L side deficits, frozen shoulder, elbow flexion contracture, hand flexion contracture at MCP. No functional use of LUE LUE Coordination: decreased fine motor;decreased gross motor    Lower Extremity Assessment Lower Extremity Assessment: Generalized weakness (limited knee flex bilaterally)    Cervical / Trunk Assessment Cervical / Trunk Assessment: Other exceptions Cervical / Trunk  Exceptions: increased body habitus  Communication   Communication Communication: No apparent difficulties    Cognition Arousal: Alert Behavior During Therapy: WFL for tasks assessed/performed   PT - Cognitive impairments: No family/caregiver present to determine baseline                       PT - Cognition Comments: unable to give an accurate timeline of events past few years Following commands: Intact       Cueing Cueing Techniques: Verbal cues     General Comments General comments (skin integrity, edema, etc.): VSS    Exercises     Assessment/Plan    PT Assessment All further PT needs can be met in the next venue of care  PT Problem List Decreased strength;Decreased cognition;Decreased balance;Decreased mobility       PT Treatment Interventions      PT Goals (Current goals can be found in the Care Plan section)  Acute Rehab PT Goals Patient Stated Goal: return to SNF PT Goal Formulation: All assessment and education complete, DC therapy    Frequency       Co-evaluation PT/OT/SLP Co-Evaluation/Treatment: Yes Reason for Co-Treatment: Complexity of the patient's impairments (multi-system involvement);For patient/therapist safety;To address functional/ADL transfers PT goals addressed during session: Mobility/safety with mobility OT goals addressed during session: ADL's and self-care       AM-PAC PT "6 Clicks" Mobility  Outcome Measure Help needed turning from your back to your side while in a flat bed without using bedrails?: Total Help needed moving from lying on your back to sitting on the side of a flat bed without using bedrails?: Total Help needed moving to and from a bed to a chair (including a wheelchair)?: Total Help needed standing up from a chair using your arms (e.g., wheelchair or bedside chair)?: Total Help needed to walk in hospital room?: Total Help needed climbing 3-5 steps with a railing? : Total 6 Click Score: 6    End of Session    Activity Tolerance: Patient tolerated treatment well Patient left: in bed;with call bell/phone within reach;with bed alarm set Nurse Communication: Mobility status;Need for lift equipment PT Visit Diagnosis: Muscle weakness (generalized) (M62.81)    Time: 4098-1191 PT Time Calculation (min) (ACUTE ONLY): 28 min   Charges:   PT Evaluation $PT Eval Moderate Complexity: 1 Mod   PT General Charges $$ ACUTE PT VISIT: 1 Visit         Amey Ka, PT  Acute Rehab Services Secure chat preferred Office [phone]   Deloris Fetters Brantley Wiley 03/08/2024, 12:23 PM

## 2024-03-11 LAB — CULTURE, BLOOD (ROUTINE X 2): Culture: NO GROWTH

## 2024-03-20 ENCOUNTER — Ambulatory Visit (INDEPENDENT_AMBULATORY_CARE_PROVIDER_SITE_OTHER): Payer: Medicare (Managed Care) | Admitting: Endocrinology

## 2024-03-20 ENCOUNTER — Encounter: Payer: Self-pay | Admitting: Endocrinology

## 2024-03-20 VITALS — BP 130/80 | HR 83 | Resp 20

## 2024-03-20 DIAGNOSIS — E0590 Thyrotoxicosis, unspecified without thyrotoxic crisis or storm: Secondary | ICD-10-CM | POA: Diagnosis not present

## 2024-03-20 DIAGNOSIS — E0500 Thyrotoxicosis with diffuse goiter without thyrotoxic crisis or storm: Secondary | ICD-10-CM | POA: Diagnosis not present

## 2024-03-20 NOTE — Progress Notes (Signed)
 Outpatient Endocrinology Note [place] Kili Gracy, MD  03/20/24  Patient's Name: Jamie Haas    DOB: [birth year]    MRN: [id number]  REASON OF VISIT: Follow-up for hyperthyroidism / Graves' disease  REFERRING PROVIDER:   PCP: Tracey Friday., MD  HISTORY OF PRESENT ILLNESS:   KEONTAE CH is a 78 y.o. old female with past medical history as listed below is presented for a evaluation of hyperthyroidism.   Pertinent Thyroid  History: Referred for evaluation and management of hyperthyroidism, initial Endo consult in August 2024.  Patient had thyroid  lab TSH < 0.01 on May 06, 2023 and repeat thyroid  function test on May 18, 2023 reviewed from the medical record and noted as follows: TSH <0.01, reference range 0.27 - 4.20, Total T4 10.5, reference range 5.1-14.1, Thyroid  uptake 0.93, reference range 0.80-1.30. Free thyroxine index 0.2 low reference range 4.4-11.4.  Patient denies palpitation, heat intolerance, increased sweating.  No family history of thyroid  disorder.  Patient had cold symptoms /upper respiratory tract infection requiring oxygen therapy in June.  Does not recall having neck pain.  Patient denies taking steroid during that illness in June.  Denies CT scan with IV iodine  contrast.  - Laboratory evaluation in August 2024 : suppressed TSH with normal free T4 and free T3.  She had negative thyroglobulin antibody and thyroid  peroxidase antibody.  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  - In August 2024 started on methimazole  5 mg daily.   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  Interval history  Patient has been taking methimazole  5 mg daily, medication list from the facility reviewed.  Patient denies palpitation or heat intolerance.  No change in health status.  Denies any GI upset, recent  illness or fever.  She is accompanied by staff from the facility.  Recent thyroid  function test normal on April 30 as noted below.   Latest Reference Range & Units 03/07/24 04:32 03/07/24 06:43  TSH 0.350 - 4.500 uIU/mL 1.856   T4,Free(Direct) 0.61 - 1.12 ng/dL  8.41    REVIEW OF SYSTEMS:  As per history of present illness.   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   Arthritis    Depression    Hypertension    Stroke [facility]) 2011   left sided weakness    PAST SURGICAL HISTORY: Past Surgical History:  Procedure Laterality Date   BRAIN SURGERY  2011   coils inserted   HARDWARE REMOVAL Left 04/29/2016   Procedure: HARDWARE REMOVAL LEFT ANKLE;  Surgeon: Wes Hamman, MD;  Location: [facility] OR;  Service: Orthopedics;  Laterality: Left;   ORIF ANKLE FRACTURE Left 12/24/2015   Procedure: OPEN REDUCTION INTERNAL FIXATION (ORIF) ANKLE FRACTURE;  Surgeon: Wes Hamman, MD;  Location: [facility] OR;  Service: Orthopedics;  Laterality: Left;   TOTAL KNEE ARTHROPLASTY     TOTAL KNEE ARTHROPLASTY Right 04/29/2016   Procedure: RIGHT TOTAL KNEE ARTHROPLASTY;  Surgeon: Wes Hamman, MD;  Location: [facility] OR;  Service: Orthopedics;  Laterality: Right;   VULVAR LESION REMOVAL  03/17/2012   Procedure: VULVAR LESION;  Surgeon: Gabrielle Joiner, MD;  Location: [facility] ORS;  Service: Gynecology;  Laterality: N/A;  CO2 Laser Vaporization Of Condyloma    ALLERGIES: No Known Allergies  FAMILY HISTORY:  Family History  Problem Relation Age of Onset   Hypertension Mother    Osteoarthritis Mother    Cancer Mother     SOCIAL HISTORY: Social History   Socioeconomic History   Marital status: Legally Separated    Spouse name: Not on file   Number of children: Not on file   Years of education: Not on file   Highest education level: Not on file  Occupational History   Not on file  Tobacco Use   Smoking status: Every Day    Current  packs/day: 0.25    Average packs/day: 0.3 packs/day for 30.0 years (7.5 ttl pk-yrs)    Types: Cigarettes   Smokeless tobacco: Never  Substance and Sexual Activity   Alcohol use: No   Drug use: No   Sexual activity: Not on file  Other Topics Concern   Not on file  Social History Narrative   ** Merged History Encounter **       Social Drivers of Health   Financial Resource Strain: Not on file  Food Insecurity: No Food Insecurity (03/07/2024)   Hunger Vital Sign    Worried About Running Out of Food in the Last Year: Never true    Ran Out of Food in the Last Year: Never true  Transportation Needs: No Transportation Needs (03/07/2024)   PRAPARE - Administrator, Civil Service (Medical): No    Lack of Transportation (Non-Medical): No  Physical Activity: Not on file  Stress: Not on file  Social Connections: Moderately Integrated (03/07/2024)   Social Connection and Isolation Panel [NHANES]    Frequency of Communication with Friends and Family: Three times a week    Frequency of Social Gatherings with Friends and Family: Three times a week    Attends Religious Services: More than 4 times per year    Active Member of Clubs or Organizations: Yes    Attends Banker Meetings: Never    Marital Status: Separated    MEDICATIONS:  Current Outpatient Medications  Medication Sig Dispense Refill   albuterol  (VENTOLIN  HFA) 108 (90 Base) MCG/ACT inhaler Inhale 2 puffs into the lungs every 6 (six) hours as needed for wheezing or shortness of breath.     aspirin  EC 81 MG tablet Take 81 mg by mouth daily.     atorvastatin  (LIPITOR) 10 MG tablet Take 10 mg by mouth daily.     cholecalciferol (VITAMIN D) 1000 units tablet Take 1,000 Units by mouth daily.     diclofenac  sodium (VOLTAREN ) 1 % GEL Apply 2 g topically 4 (four) times daily. (Patient taking differently: Apply 2 g topically 2 (two) times daily as needed (to both shoulders for pain).) 100 g 0   Emollient (EUCERIN  DAILY HYDRATION) CREA Apply 1 application  topically in the morning and at bedtime.     ferrous sulfate  324 MG TBEC Take 324 mg by mouth daily.     gabapentin  (NEURONTIN ) 100 MG capsule Take 100 mg by mouth daily.     melatonin 3 MG TABS tablet Take 6 mg by mouth at bedtime.     methimazole  (TAPAZOLE ) 5 MG tablet Take 5 mg by mouth daily.     mirtazapine  (REMERON ) 7.5 MG tablet Take 7.5 mg by mouth at bedtime.     sertraline  (ZOLOFT ) 50 MG tablet Take 50 mg by mouth daily.     Tiotropium Bromide  Monohydrate (SPIRIVA  RESPIMAT) 2.5 MCG/ACT AERS Inhale 2 puffs into the lungs daily.     valACYclovir  (VALTREX )  500 MG tablet Take 500 mg by mouth daily.     Venlafaxine  HCl 75 MG TB24 Take 1 tablet by mouth daily.     Zinc Oxide 10 % OINT Apply 1 application  topically in the morning and at bedtime. Apply to buttocks and groin every day and night shift. May also be applied as needed for skin irritation.     No current facility-administered medications for this visit.    PHYSICAL EXAM: Vitals:   03/20/24 0957  BP: 130/80  Pulse: 83  Resp: 20  SpO2: 97%    There is no height or weight on file to calculate BMI.  Wt Readings from Last 3 Encounters:  03/06/24 211 lb (95.7 kg)  04/19/17 211 lb (95.7 kg)  03/09/17 210 lb 12.8 oz (95.6 kg)     General: Well developed, well nourished female in no apparent distress.  HEENT: AT/[place], no external lesions. Hearing intact to the spoken word Eyes: No stare, proptosis or lid lag. Conjunctiva clear and no icterus. No erythema or watering Neck: Trachea midline, neck supple without appreciable thyromegaly or lymphadenopathy and no palpable thyroid  nodules Lungs: Clear to auscultation Heart: S1S2, Regular in rate and rhythm.  Abdomen: Soft Neurologic: Alert, oriented, normal speech, left-sided weakness Extremities: No pedal pitting edema. Skin: Warm, Psychiatric: Does not appear depressed or anxious  PERTINENT HISTORIC LABORATORY AND IMAGING STUDIES:   All pertinent laboratory results were reviewed. Please see HPI also for further details.   TSH  Date Value Ref Range Status  03/07/2024 1.856 0.350 - 4.500 uIU/mL Final    Comment:    Performed by a 3rd Generation assay with a functional sensitivity of <=0.01 uIU/mL. Performed at [facility], [address] [place], [place] [address]   12/22/2023 3.88 0.40 - 4.50 mIU/L Final  06/29/2023 <0.01 Repeated and verified X2. (L) 0.35 - 5.50 uIU/mL Final    Lab Results  Component Value Date   FREET4 0.62 03/07/2024   FREET4 0.8 12/22/2023   FREET4 1.15 06/29/2023   T3FREE 2.3 12/22/2023   T3FREE 3.2 06/29/2023   TSH 1.856 03/07/2024   TSH 3.88 12/22/2023   TSH <0.01 Repeated and verified X2. (L) 06/29/2023    No results found for: "THYROTRECAB"  Lab Results  Component Value Date   TSH 1.856 03/07/2024   TSH 3.88 12/22/2023   TSH <0.01 Repeated and verified X2. (L) 06/29/2023   FREET4 0.62 03/07/2024   FREET4 0.8 12/22/2023   FREET4 1.15 06/29/2023     No results found for: "TSI"   No components found for: "TRAB"    ASSESSMENT / PLAN  1. Graves disease   2. Hyperthyroidism     -Patient has hyperthyroidism due to Graves' disease with positive thyrotropin receptor antibody.  Methimazole  5 mg daily was started in August 2024.  -Recent thyroid  function test normal.  Plan: - Continue current dose of methimazole  5 mg daily.   Diagnoses and all orders for this visit:  Graves disease  Hyperthyroidism    DISPOSITION Follow up in clinic in 4 months suggested.  Labs on the same day of the visit.  All questions answered and patient verbalized understanding of the plan.  [place] Lynn Sissel, MD [facility] Endocrinology [facility] [address] [place], [address] [place], [place] [address] Phone # [phone]   At least part of this note was generated using voice recognition software. Inadvertent word errors may have occurred, which were not recognized during  the proofreading process.

## 2024-03-20 NOTE — Progress Notes (Signed)
 Patient unable to stand, aid did not know patient weight. Patient and aid are unsure if there has been any changes in patient meds.

## 2024-05-25 ENCOUNTER — Emergency Department (HOSPITAL_COMMUNITY): Payer: Medicare (Managed Care)

## 2024-05-25 ENCOUNTER — Other Ambulatory Visit: Payer: Self-pay

## 2024-05-25 ENCOUNTER — Emergency Department (HOSPITAL_COMMUNITY)
Admission: EM | Admit: 2024-05-25 | Discharge: 2024-05-26 | Disposition: A | Discharge status: Skilled Nursing Facility | Payer: Medicare (Managed Care) | Source: Skilled Nursing Facility | Attending: Emergency Medicine | Admitting: Emergency Medicine

## 2024-05-25 DIAGNOSIS — K5730 Diverticulosis of large intestine without perforation or abscess without bleeding: Secondary | ICD-10-CM | POA: Insufficient documentation

## 2024-05-25 DIAGNOSIS — R4182 Altered mental status, unspecified: Secondary | ICD-10-CM | POA: Diagnosis present

## 2024-05-25 DIAGNOSIS — K8020 Calculus of gallbladder without cholecystitis without obstruction: Secondary | ICD-10-CM | POA: Diagnosis not present

## 2024-05-25 DIAGNOSIS — I700 Atherosclerosis of aorta: Secondary | ICD-10-CM | POA: Diagnosis not present

## 2024-05-25 DIAGNOSIS — Z7982 Long term (current) use of aspirin: Secondary | ICD-10-CM | POA: Diagnosis not present

## 2024-05-25 DIAGNOSIS — R404 Transient alteration of awareness: Secondary | ICD-10-CM | POA: Diagnosis not present

## 2024-05-25 DIAGNOSIS — R109 Unspecified abdominal pain: Secondary | ICD-10-CM | POA: Insufficient documentation

## 2024-05-25 LAB — COMPREHENSIVE METABOLIC PANEL WITH GFR
ALT: 11 U/L (ref 0–44)
AST: 16 U/L (ref 15–41)
Albumin: 3.3 g/dL — ABNORMAL LOW (ref 3.5–5.0)
Alkaline Phosphatase: 73 U/L (ref 38–126)
Anion gap: 10 (ref 5–15)
BUN: 13 mg/dL (ref 8–23)
CO2: 25 mmol/L (ref 22–32)
Calcium: 9.4 mg/dL (ref 8.9–10.3)
Chloride: 107 mmol/L (ref 98–111)
Creatinine, Ser: 0.91 mg/dL (ref 0.44–1.00)
GFR, Estimated: >60 mL/min (ref >60)
Glucose, Bld: 101 mg/dL — ABNORMAL HIGH (ref 70–99)
Potassium: 4.8 mmol/L (ref 3.5–5.1)
Sodium: 142 mmol/L (ref 135–145)
Total Bilirubin: 0.7 mg/dL (ref 0.0–1.2)
Total Protein: 7.6 g/dL (ref 6.5–8.1)

## 2024-05-25 LAB — TSH: TSH: 1.24 u[IU]/mL (ref 0.350–4.500)

## 2024-05-25 LAB — CBC WITH DIFFERENTIAL/PLATELET
Abs Immature Granulocytes: 0.01 K/uL (ref 0.00–0.07)
Basophils Absolute: 0 K/uL (ref 0.0–0.1)
Basophils Relative: 0 %
Eosinophils Absolute: 0.3 K/uL (ref 0.0–0.5)
Eosinophils Relative: 4 %
HCT: 36.7 % (ref 36.0–46.0)
Hemoglobin: 11.9 g/dL — ABNORMAL LOW (ref 12.0–15.0)
Immature Granulocytes: 0 %
Lymphocytes Relative: 29 %
Lymphs Abs: 2.3 K/uL (ref 0.7–4.0)
MCH: 31.3 pg (ref 26.0–34.0)
MCHC: 32.4 g/dL (ref 30.0–36.0)
MCV: 96.6 fL (ref 80.0–100.0)
Monocytes Absolute: 0.5 K/uL (ref 0.1–1.0)
Monocytes Relative: 6 %
Neutro Abs: 4.7 K/uL (ref 1.7–7.7)
Neutrophils Relative %: 61 %
Platelets: 292 K/uL (ref 150–400)
RBC: 3.8 MIL/uL — ABNORMAL LOW (ref 3.87–5.11)
RDW: 14.2 % (ref 11.5–15.5)
WBC: 7.9 K/uL (ref 4.0–10.5)
nRBC: 0 % (ref 0.0–0.2)

## 2024-05-25 LAB — RAPID URINE DRUG SCREEN, HOSP PERFORMED
Amphetamines: NOT DETECTED
Barbiturates: NOT DETECTED
Benzodiazepines: NOT DETECTED
Cocaine: NOT DETECTED
Opiates: NOT DETECTED
Tetrahydrocannabinol: NOT DETECTED

## 2024-05-25 LAB — I-STAT VENOUS BLOOD GAS, ED
Acid-Base Excess: 4 mmol/L — ABNORMAL HIGH (ref 0.0–2.0)
Bicarbonate: 29.2 mmol/L — ABNORMAL HIGH (ref 20.0–28.0)
Calcium, Ion: 1.12 mmol/L — ABNORMAL LOW (ref 1.15–1.40)
HCT: 36 % (ref 36.0–46.0)
Hemoglobin: 12.2 g/dL (ref 12.0–15.0)
O2 Saturation: 99 %
Potassium: 4.8 mmol/L (ref 3.5–5.1)
Sodium: 143 mmol/L (ref 135–145)
TCO2: 30 mmol/L (ref 22–32)
pCO2, Ven: 43.7 mmHg — ABNORMAL LOW (ref 44–60)
pH, Ven: 7.432 — ABNORMAL HIGH (ref 7.25–7.43)
pO2, Ven: 132 mmHg — ABNORMAL HIGH (ref 32–45)

## 2024-05-25 LAB — URINALYSIS, ROUTINE W REFLEX MICROSCOPIC
Bilirubin Urine: NEGATIVE
Glucose, UA: NEGATIVE mg/dL
Hgb urine dipstick: NEGATIVE
Ketones, ur: NEGATIVE mg/dL
Leukocytes,Ua: NEGATIVE
Nitrite: NEGATIVE
Protein, ur: NEGATIVE mg/dL
Specific Gravity, Urine: 1.014 (ref 1.005–1.030)
pH: 5 (ref 5.0–8.0)

## 2024-05-25 LAB — ETHANOL: Alcohol, Ethyl (B): <15 mg/dL (ref <15)

## 2024-05-25 LAB — I-STAT CG4 LACTIC ACID, ED: Lactic Acid, Venous: 1.8 mmol/L (ref 0.5–1.9)

## 2024-05-25 LAB — CBG MONITORING, ED: Glucose-Capillary: 102 mg/dL — ABNORMAL HIGH (ref 70–99)

## 2024-05-25 LAB — T4, FREE: Free T4: 0.91 ng/dL (ref 0.61–1.12)

## 2024-05-25 LAB — AMMONIA: Ammonia: 15 umol/L (ref 9–35)

## 2024-05-25 MED ORDER — SODIUM CHLORIDE 0.9 % IV BOLUS
1000.0000 mL | Freq: Once | INTRAVENOUS | Status: AC
  Administered 2024-05-25: 1000 mL via INTRAVENOUS

## 2024-05-25 MED ORDER — IOHEXOL 350 MG/ML SOLN
75.0000 mL | Freq: Once | INTRAVENOUS | Status: AC | PRN
  Administered 2024-05-25: 75 mL via INTRAVENOUS

## 2024-05-25 NOTE — ED Triage Notes (Signed)
 BIB [facility] from [facility] for AMS, patient is baseline A/Ox4, Patient found by staff this morning to be more lethargic and difficult to arouse. Patient unable to follow commands with staff or EMS,    BP: 132/56 HR  80 97% RA CB 107   1g of rocephin  Im given by staff at [facility] (1450)

## 2024-05-25 NOTE — ED Notes (Signed)
 Please call daughter Ginger with any updates and results.

## 2024-05-25 NOTE — Discharge Instructions (Addendum)
 Your tests today look okay.  I do not see any obvious cause for your change in mentation.  Likely your mental status has improved while you are here.  I am going to let you try and go home.  Please return for worsening symptoms fever or difficulty breathing.  Your CT scan of the lungs showed that you have a nodule in a part of your lungs called the lingula.  When the radiologist compared this to prior imaging it has changed and they are concerned that it could be cancerous.  If you want to have this further worked up please discuss it with your family doctor who will likely refer you to see someone that could try to perform the biopsy.

## 2024-05-25 NOTE — ED Notes (Signed)
 [facility] called for transport.

## 2024-05-25 NOTE — ED Notes (Signed)
 Attempted to call report to [facility] [phone]) x [phone] and left a message for DON, as well as, on receptionist's extension. I spoke with pt's dtr, Tabitha, who is aware pt is being discharged and treatment and follow up plans were discussed. She reports that she does not have an alternate number for [facility] and that she has difficulty reaching them by phone at night, as well. Beth, Consulting civil engineer made aware

## 2024-05-25 NOTE — ED Provider Notes (Signed)
 [place] EMERGENCY DEPARTMENT AT [facility] Provider Note   CSN: [id number] Arrival date & time: 05/25/24  1546     Patient presents with: Altered Mental Status   Jamie Haas is a 78 y.o. female.   78 yo F with a chief complaint of altered mental status.  Less awake than normal.  Last sinus occurred the patient had pneumonia.  Was given an IM shot of Rocephin  by her facility.  No significant improvement and so the patient was sent here for evaluation.  No obvious reported cough congestion or fever per EMS.  Patient unable to provide any history level 5 caveat.   Altered Mental Status      Prior to Admission medications   Medication Sig Start Date End Date Taking? Authorizing Provider  albuterol  (VENTOLIN  HFA) 108 (90 Base) MCG/ACT inhaler Inhale 2 puffs into the lungs every 6 (six) hours as needed for wheezing or shortness of breath. 03/08/24   Pokhrel, Laxman, MD  aspirin  EC 81 MG tablet Take 81 mg by mouth daily.    [provider]  atorvastatin  (LIPITOR) 10 MG tablet Take 10 mg by mouth daily. 06/23/23   [provider]  cholecalciferol (VITAMIN D) 1000 units tablet Take 1,000 Units by mouth daily.    [provider]  diclofenac  sodium (VOLTAREN ) 1 % GEL Apply 2 g topically 4 (four) times daily. Patient taking differently: Apply 2 g topically 2 (two) times daily as needed (to both shoulders for pain). 12/25/15   Waddell Mabel LABOR, MD  Emollient (EUCERIN DAILY HYDRATION) CREA Apply 1 application  topically in the morning and at bedtime.    [provider]  ferrous sulfate  324 MG TBEC Take 324 mg by mouth daily.    [provider]  gabapentin  (NEURONTIN ) 100 MG capsule Take 100 mg by mouth daily. 06/11/23   [provider]  melatonin 3 MG TABS tablet Take 6 mg by mouth at bedtime.    [provider]  methimazole  (TAPAZOLE ) 5 MG tablet Take 5 mg by mouth daily.    [provider]  mirtazapine   (REMERON ) 7.5 MG tablet Take 7.5 mg by mouth at bedtime.    [provider]  sertraline  (ZOLOFT ) 50 MG tablet Take 50 mg by mouth daily. 06/09/23   [provider]  Tiotropium Bromide  Monohydrate (SPIRIVA  RESPIMAT) 2.5 MCG/ACT AERS Inhale 2 puffs into the lungs daily.    [provider]  valACYclovir  (VALTREX ) 500 MG tablet Take 500 mg by mouth daily. 06/17/23   [provider]  Venlafaxine  HCl 75 MG TB24 Take 1 tablet by mouth daily. 05/11/23   [provider]  Zinc Oxide 10 % OINT Apply 1 application  topically in the morning and at bedtime. Apply to buttocks and groin every day and night shift. May also be applied as needed for skin irritation.    [provider]    Allergies: Patient has no known allergies.    Review of Systems  Updated Vital Signs BP 116/81 (BP Location: Right Arm)   Pulse 84   Temp (!) 97.4 F (36.3 C) (Axillary)   Resp 15   SpO2 97%   Physical Exam Vitals and nursing note reviewed.  Constitutional:      General: She is not in acute distress.    Appearance: She is well-developed. She is not diaphoretic.  HENT:     Head: Normocephalic and atraumatic.  Eyes:     Pupils: Pupils are equal, round, and  reactive to light.  Cardiovascular:     Rate and Rhythm: Normal rate and regular rhythm.     Heart sounds: No murmur heard.    No friction rub. No gallop.  Pulmonary:     Effort: Pulmonary effort is normal.     Breath sounds: No wheezing or rales.  Abdominal:     General: There is no distension.     Palpations: Abdomen is soft.     Tenderness: There is no abdominal tenderness.  Musculoskeletal:        General: No tenderness.     Cervical back: Normal range of motion and neck supple.  Skin:    General: Skin is warm and dry.  Neurological:     Mental Status: She is alert.     Comments: Patient is quite sleepy and snoring on exam.  Awakens to sternal rub.  Follows commands.  Unable to answer questions.   Psychiatric:        Behavior: Behavior normal.     (all labs ordered are listed, but only abnormal results are displayed) Labs Reviewed  COMPREHENSIVE METABOLIC PANEL WITH GFR - Abnormal; Notable for the following components:      Result Value   Glucose, Bld 101 (*)    Albumin 3.3 (*)    All other components within normal limits  CBC WITH DIFFERENTIAL/PLATELET - Abnormal; Notable for the following components:   RBC 3.80 (*)    Hemoglobin 11.9 (*)    All other components within normal limits  CBG MONITORING, ED - Abnormal; Notable for the following components:   Glucose-Capillary 102 (*)    All other components within normal limits  I-STAT VENOUS BLOOD GAS, ED - Abnormal; Notable for the following components:   pH, Ven 7.432 (*)    pCO2, Ven 43.7 (*)    pO2, Ven 132 (*)    Bicarbonate 29.2 (*)    Acid-Base Excess 4.0 (*)    Calcium , Ion 1.12 (*)    All other components within normal limits  AMMONIA  RAPID URINE DRUG SCREEN, HOSP PERFORMED  ETHANOL  URINALYSIS, ROUTINE W REFLEX MICROSCOPIC  TSH  T4, FREE  I-STAT CG4 LACTIC ACID, ED    EKG: EKG Interpretation Date/Time:  Friday May 25 2024 16:19:57 EDT Ventricular Rate:  77 PR Interval:  152 QRS Duration:  83 QT Interval:  398 QTC Calculation: 451 R Axis:   9  Text Interpretation: Sinus rhythm Abnormal R-wave progression, early transition Inferior infarct, old No significant change since last tracing Confirmed by Emil Share [id number]) on 05/25/2024 5:39:01 PM  Radiology: CT ABDOMEN PELVIS W CONTRAST Result Date: 05/25/2024 CLINICAL DATA:  Acute abdominal pain and altered mental status EXAM: CT ABDOMEN AND PELVIS WITH CONTRAST TECHNIQUE: Multidetector CT imaging of the abdomen and pelvis was performed using the standard protocol following bolus administration of intravenous contrast. RADIATION DOSE REDUCTION: This exam was performed according to the departmental dose-optimization program which includes automated exposure  control, adjustment of the mA and/or kV according to patient size and/or use of iterative reconstruction technique. CONTRAST:  75mL OMNIPAQUE IOHEXOL 350 MG/ML SOLN COMPARISON:  CT abdomen and pelvis dated 03/06/2024 FINDINGS: Lower chest: Increased size of lingular nodule measuring 2.5 x 2.0 cm (5:6), previously 1.9 x 1.4 cm. Interval resolution of bilateral lower lobe consolidations. No pleural effusion or pneumothorax demonstrated. Partially imaged heart size is normal. Coronary artery calcifications. Mitral annular calcifications. Hepatobiliary: No focal hepatic lesions. No intra or extrahepatic biliary ductal dilation. Cholelithiasis. Calcification of the gallbladder  fundus may represent mural calcification or an additional calcified stone. Pancreas: No focal lesions or main ductal dilation. Spleen: Normal in size without focal abnormality. Adrenals/Urinary Tract: No adrenal nodules. No suspicious renal mass, calculi or hydronephrosis. Left upper pole simple cyst. No specific follow-up imaging recommended. No focal bladder wall thickening. Stomach/Bowel: Normal appearance of the stomach. No evidence of bowel wall thickening, distention, or inflammatory changes. Colonic diverticulosis without acute diverticulitis. Normal appendix. Vascular/Lymphatic: Aortic atherosclerosis. No enlarged abdominal or pelvic lymph nodes. Reproductive: No adnexal masses. Other: No free fluid, fluid collection, or free air. Musculoskeletal: No acute or abnormal lytic or blastic osseous lesions. Multilevel degenerative changes of the partially imaged thoracic and lumbar spine. Similar L3 compression deformity and grade 1 anterolisthesis at L5-S1. Small focus of subcutaneous soft tissue stranding within the left anterior lower abdominal wall (3:70), likely injection related. IMPRESSION: 1. No acute abdominopelvic findings. 2. Increased size of lingular nodule, suspicious for primary lung malignancy. 3. Interval resolution of bilateral  lower lobe pneumonia. 4. Cholelithiasis. 5. Colonic diverticulosis without acute diverticulitis. 6.  Aortic Atherosclerosis (ICD10-I70.0). Electronically Signed   By: Limin  Xu M.D.   On: 05/25/2024 19:15   CT HEAD WO CONTRAST Result Date: 05/25/2024 CLINICAL DATA:  Mental status change EXAM: CT HEAD WITHOUT CONTRAST TECHNIQUE: Contiguous axial images were obtained from the base of the skull through the vertex without intravenous contrast. RADIATION DOSE REDUCTION: This exam was performed according to the departmental dose-optimization program which includes automated exposure control, adjustment of the mA and/or kV according to patient size and/or use of iterative reconstruction technique. COMPARISON:  CT brain 03/06/2024 FINDINGS: Brain: No acute territorial infarction, hemorrhage or intracranial mass. Fairly extensive encephalomalacia within the right frontal, parietal and occipital lobes with encephalomalacia in the left frontal lobe, similar as compared with the prior exam. Atrophy and probable chronic small vessel ischemic changes of the white matter. Stable ventricle size with ex vacuo dilatation of the right ventricle. 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 Vascular calcifications. Skull: No fracture.  Probable small right frontal burr hole Sinuses/Orbits: Moderate mucosal thickening in the sinuses Other: None IMPRESSION: 1. No CT evidence for acute intracranial abnormality. Atrophy and chronic small vessel ischemic changes of the white matter 2. Atrophy and chronic small vessel ischemic changes of the white matter. Fairly extensive encephalomalacia within the right frontal, parietal and occipital lobes with encephalomalacia in the left frontal lobe, similar as compared with the prior exam. Electronically Signed   By: Luke Bun M.D.   On: 05/25/2024 17:47   DG Chest Port 1 View Result Date: 05/25/2024 CLINICAL DATA:  Altered mental status. EXAM: PORTABLE CHEST 1 VIEW COMPARISON:   03/06/2024. FINDINGS: Low lung volume. Mild increased interstitial markings are likely accentuated by low lung volume. Bilateral lung fields are otherwise clear. No dense consolidation or lung collapse. Bilateral costophrenic angles are clear. Stable cardio-mediastinal silhouette. No acute osseous abnormalities. The soft tissues are within normal limits. IMPRESSION: *No active disease. Electronically Signed   By: Ree Molt M.D.   On: 05/25/2024 16:36     Procedures   Medications Ordered in the ED  sodium chloride  0.9 % bolus 1,000 mL (1,000 mLs Intravenous New Bag/Given 05/25/24 1845)  iohexol (OMNIPAQUE) 350 MG/ML injection 75 mL (75 mLs Intravenous Contrast Given 05/25/24 1834)  Medical Decision Making Amount and/or Complexity of Data Reviewed Labs: ordered. Radiology: ordered.  Risk Prescription drug management.   78 yo F with a chief complaints of altered mental status.  Per report it sounds like the patient is more sleepy than normal.  She does awaken with painful stimuli.  Will obtain a laboratory evaluation bolus of IV fluids CT head chest x-ray UA reassess.  CT of the head without obvious acute change from prior.  Lactate normal.  Not hypercarbic.  No significant electrolyte abnormalities.  UA is negative for infection.  Chest x-ray on my independent interpretation without focal infiltrate or pneumothorax.  Reviewed the patient's records.  Last time there was some concern for abdominal pain and so CT imaging of the abdomen pelvis was obtained.  She was found to have possible pneumonia.  She does have abdominal pain on exam.  I wonder if this is a chronic finding for her.  Regardless we will obtain repeat CT imaging.  This has resulted without obvious pneumonia or other infectious cause.  The radiologist is concerned perhaps the patient has primary lung cancer.  I discussed this with the patient still not sure how well she understands at  baseline.  Encouraged her to follow-up with her PCP in the office.  7:38 PM:  I have discussed the diagnosis/risks/treatment options with the patient.  Evaluation and diagnostic testing in the emergency department does not suggest an emergent condition requiring admission or immediate intervention beyond what has been performed at this time.  They will follow up with PCP. We also discussed returning to the ED immediately if new or worsening sx occur. We discussed the sx which are most concerning (e.g., sudden worsening pain, fever, inability to tolerate by mouth, change in mentation) that necessitate immediate return. Medications administered to the patient during their visit and any new prescriptions provided to the patient are listed below.  Medications given during this visit Medications  sodium chloride  0.9 % bolus 1,000 mL (1,000 mLs Intravenous New Bag/Given 05/25/24 1845)  iohexol (OMNIPAQUE) 350 MG/ML injection 75 mL (75 mLs Intravenous Contrast Given 05/25/24 1834)     The patient appears reasonably screen and/or stabilized for discharge and I doubt any other medical condition or other [facility] requiring further screening, evaluation, or treatment in the ED at this time prior to discharge.       Final diagnoses:  Transient alteration of awareness    ED Discharge Orders     None          Emil Share, DO 05/25/24 1938

## 2024-06-12 ENCOUNTER — Telehealth (HOSPITAL_BASED_OUTPATIENT_CLINIC_OR_DEPARTMENT_OTHER): Payer: Self-pay | Admitting: Acute Care

## 2024-06-27 ENCOUNTER — Ambulatory Visit: Payer: Medicare (Managed Care) | Admitting: Acute Care

## 2024-06-28 ENCOUNTER — Encounter (HOSPITAL_BASED_OUTPATIENT_CLINIC_OR_DEPARTMENT_OTHER): Payer: Self-pay | Admitting: Pulmonary Disease

## 2024-06-28 ENCOUNTER — Ambulatory Visit (INDEPENDENT_AMBULATORY_CARE_PROVIDER_SITE_OTHER): Payer: Medicare (Managed Care) | Admitting: Pulmonary Disease

## 2024-06-28 ENCOUNTER — Encounter: Payer: Self-pay | Admitting: Pulmonary Disease

## 2024-06-28 VITALS — BP 117/64 | HR 89

## 2024-06-28 DIAGNOSIS — R911 Solitary pulmonary nodule: Secondary | ICD-10-CM | POA: Insufficient documentation

## 2024-06-28 NOTE — Telephone Encounter (Signed)
 The patient's bronchoscopy, PET, and CT scans need to be rescheduled to a date after 9/5, as insurance requires 15 days from August 21 to make a determination. Which date you would prefer to reschedule the bronchoscopy?

## 2024-06-28 NOTE — Telephone Encounter (Signed)
 9/11 AM or 9/18 PM

## 2024-06-28 NOTE — Telephone Encounter (Signed)
 Sounds good. I think her super D and PET/CT is scheduled 8/29. We can set up biopsy after that.

## 2024-06-28 NOTE — Assessment & Plan Note (Signed)
 Patient has a lingular solitary pulmonary nodule now measuring 2.5 x 2 cm increased in size from 1.9 cm in 3 months between April 2025 and July 2025 scan. The patient has a prior 25 PY smoking hx. She does have family hx of lung cancer in father. This may represent a primary lung neoplasm. I discussed with patient considering tissue biopsy with navigational bronchoscopy which she is agreeable to but would like me to discuss with her daughter. I tried to contact daughter multiple times without success. I left her a voice message. I submitted an order for Super D CT and PET/CT for now. Will plan on navigational bronchoscopy next week. I ordered PFTs as well to assess baseline lung function.

## 2024-06-28 NOTE — Patient Instructions (Signed)
-   Get CT chest and PET/CT done soon - If daughter is Ok with it, will proceed with biopsy hopefully next week

## 2024-06-28 NOTE — Progress Notes (Signed)
 New Patient Pulmonology Office Visit   Subjective:  Patient ID: Jamie Haas, female    DOB: 1946/03/01  MRN: [id number]  Referred by: Dietrich Males, AGNP-C  CC:  Chief Complaint  Patient presents with   Establish Care    HPI Jamie Haas is a 78 y.o. female with arthritis, MDD, HTN, Grave's disease, CVA with L hemiparesis who presents for initial evaluation of LUL solitary pulmonary nodule.  The patient sent to the ED for AMS on 02/2024. Admitted for suspected sepsis with pneumonia. Treated with antimicrobials. Incidentally noted to have lingular nodule on CT ABD/PLV 02/2024. She later went to ED for AMS and abdominal pain. Had repeat CT ABD/PLV 05/2024 showing the same lingular nodule but increasing in size compared to prior. She was referred to pulmonology for further evaluation of that. Today, she notes that she is feeling well but CM notes that she has been wheezing. Not short of breath. Coughing some and has some phlegm. No hemoptysis. Gained weight. 1 time pneumonia. She chokes and gurgles when she eats. She had 1 episode of vomiting in the last few weeks. No concerns for aspiration when upright. No constitutional symptoms.  Symptoms associated with Lung cancer:   - Sx of Central Tumors:  [ ]  Dyspnea  [X]  Cough  [ ]  Hemoptysis  [X]  Wheezing  [ ]  Post-obstructive pneumonia   - Sx of Peripheral Tumors:  [ ]  Cough  [ ]  Dyspnea  [ ]  Pain   - Sx of locoregional spread:  [ ]  Superior vena cava obstruction - facial flushing/redness, upper extremity edema, chest pain, cyanosis  [ ]  Sx of recurrent laryngeal nerve palsy - hoarseness  [ ]  Sx of phrenic nerve palsy - elevated hemidiaphragm and dyspnea  [ ]  Brachial nerve root compression - Horner's syndrome miosis, ptosis, anhidrosis  [ ]  Esophageal compression - dysphagia, nausea, vomiting Constitutional symptoms:  [ ]  Weight loss  [ ]  Changes in appetite  [ ]  B symptoms - fevers, chills, or night sweats Sx of  metastasis:  [ ]  CNS - headaches, AMS, neck stiffness, seizures, nausea/vomiting, poor balance  [ ]  MSK - bone pain, limb weakness or sensory loss   Conditions associated with Lung cancer & important to identify prior to bronch:  [ ]  Hx of COPD  [X]  Hx of CVD  [ ]  Hx of OSA   Hx of Anesthesia reactions: not previously [ ]  Prior surgeries and anesthesia reactions  [ ]  Hx of difficult intubation  [ ]  Family Hx of malignant hyperthermia   PMH:  - HTN - Hyperthyroidism - HLD - Stroke with L hemiparesis  Medications: Asprin 81 mg daily  Allergies: none  SH:  [X]  Tobacco Smoking - quit 3-5 years agoi, 1/2 pack per day 50 years = 25 PY [ ]  Indoor emission from household combustion  [ ]  Outdoor air pollution   Occupational exposures: house keeping [ ]  Asbestos - Holiday representative workers, Social research officer, government, Therapist, sports, railroad, Therapist, music radiation - uranium mining  [ ]  Vinyl chloride - pulp & paper workers  [ ]  Arsenic - smelting of ores, Chief Executive Officer, wood preservation  [ ]  Beryllium - Archivist workers, Geophysical data processor, Pensions consultant workers, jewelers  [ ]  chromium - Financial trader, welding, tanning industries  [ ]  Nickel - Chief Strategy Officer, Naval architect, electroplaters, glass wokers, jewelers, metal workers & welders, Physiological scientist specific exposures:  none [ ]  Agent orange - [place] War  [ ]  Depleted  Uranium - Gulf War  [ ]  Asbestos - Cabin crew due to shipyards  [ ]  Mustard gas - WWI   FH:  [X]  Relatives with hx of lung cancer - father died with lung cancer [ ]  Relatives with other types of cancer    ASA grade:  [ ]  ASA I - normal healthy patient  [ ]  ASA II - a patient with mild systemic disease  [X]  ASA III - a patient with severe systemic disease  [ ]  ASA IV - a patient with severe systemic disease that is a constant threat to Life   Karnofsky Performance Status: [ ]  100 Normal; no complaints;  [ ]  90 Able to carry on normal  activity, minor signs,Tor symptoms of disease  [ ]  80 Normal activity with effort; some sign or symptoms of disease  [ ]  70 Cares for self; unable to carry on normal activity or do active work  [ ]  60 Requires occasional assistance, but is able to care for most personal needs  [ ]  50 Requires considerable assistance and frequent medical care  [X]  40 Disabled, requires special care and assistance  [ ]  30 Severely disabled; hospitalization is indicated, although death not imminent  [ ]  20 Very sick; hospitalization necessary; requires active support treatment  [ ]  10 Moribund; fatal processes progressing rapidly  [ ]  0 Dead   ECOG PERFORMANCE STATUS: [ ]  0 Fully Active, able to carry on all pre-disease performance without restriction  [ ]  1 Restricted in physical strenuous activity but ambulatory and able to carry out work of a light or sedentary nature e.g. light house work office work  [ ]  2 Ambulatory and capable of all self-care but unable to carry our work activities. Up and about more than 50% of walking hours  [ ]  3 Capable of only limited self-care, confined to bed or chair more than 50% of waking hours  [X]  4 Completely disable, Cannot carry on any self-care. Totally confined to bed or chair  [ ]  5 Dead  ROS  Allergies: Patient has no known allergies.  Current Outpatient Medications:    albuterol  (VENTOLIN  HFA) 108 (90 Base) MCG/ACT inhaler, Inhale 2 puffs into the lungs every 6 (six) hours as needed for wheezing or shortness of breath., Disp: , Rfl:    aspirin  EC 81 MG tablet, Take 81 mg by mouth daily., Disp: , Rfl:    atorvastatin  (LIPITOR) 10 MG tablet, Take 10 mg by mouth daily., Disp: , Rfl:    cholecalciferol (VITAMIN D) 1000 units tablet, Take 1,000 Units by mouth daily., Disp: , Rfl:    diclofenac  sodium (VOLTAREN ) 1 % GEL, Apply 2 g topically 4 (four) times daily. (Patient taking differently: Apply 2 g topically 2 (two) times daily as needed (to both shoulders for  pain).), Disp: 100 g, Rfl: 0   Emollient (EUCERIN DAILY HYDRATION) CREA, Apply 1 application  topically in the morning and at bedtime., Disp: , Rfl:    ferrous sulfate  324 MG TBEC, Take 324 mg by mouth daily., Disp: , Rfl:    gabapentin  (NEURONTIN ) 100 MG capsule, Take 100 mg by mouth daily., Disp: , Rfl:    melatonin 3 MG TABS tablet, Take 6 mg by mouth at bedtime., Disp: , Rfl:    methimazole  (TAPAZOLE ) 5 MG tablet, Take 5 mg by mouth daily., Disp: , Rfl:    mirtazapine  (REMERON ) 7.5 MG tablet, Take 7.5 mg by mouth at bedtime., Disp: , Rfl:    sertraline  (ZOLOFT )  50 MG tablet, Take 50 mg by mouth daily., Disp: , Rfl:    Tiotropium Bromide  Monohydrate (SPIRIVA  RESPIMAT) 2.5 MCG/ACT AERS, Inhale 2 puffs into the lungs daily., Disp: , Rfl:    valACYclovir  (VALTREX ) 500 MG tablet, Take 500 mg by mouth daily., Disp: , Rfl:    Venlafaxine  HCl 75 MG TB24, Take 1 tablet by mouth daily., Disp: , Rfl:    Zinc Oxide 10 % OINT, Apply 1 application  topically in the morning and at bedtime. Apply to buttocks and groin every day and night shift. May also be applied as needed for skin irritation., Disp: , Rfl:  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   Arthritis    Depression    Hypertension    Stroke [facility]) 2011   left sided weakness   Past Surgical History:  Procedure Laterality Date   BRAIN SURGERY  2011   coils inserted   HARDWARE REMOVAL Left 04/29/2016   Procedure: HARDWARE REMOVAL LEFT ANKLE;  Surgeon: Kay CHRISTELLA Cummins, MD;  Location: [facility] OR;  Service: Orthopedics;  Laterality: Left;   ORIF ANKLE FRACTURE Left 12/24/2015   Procedure: OPEN REDUCTION INTERNAL FIXATION (ORIF) ANKLE FRACTURE;  Surgeon: Kay CHRISTELLA Cummins, MD;  Location: [facility] OR;  Service: Orthopedics;  Laterality: Left;   TOTAL KNEE ARTHROPLASTY     TOTAL KNEE ARTHROPLASTY Right 04/29/2016   Procedure: RIGHT TOTAL KNEE ARTHROPLASTY;  Surgeon: Kay CHRISTELLA Cummins, MD;  Location: [facility] OR;  Service:  Orthopedics;  Laterality: Right;   VULVAR LESION REMOVAL  03/17/2012   Procedure: VULVAR LESION;  Surgeon: Carlin DELENA Centers, MD;  Location: [facility] ORS;  Service: Gynecology;  Laterality: N/A;  CO2 Laser Vaporization Of Condyloma   Family History  Problem Relation Age of Onset   Hypertension Mother    Osteoarthritis Mother    Cancer Mother    Social History   Socioeconomic History   Marital status: Legally Separated    Spouse name: Not on file   Number of children: Not on file   Years of education: Not on file   Highest education level: Not on file  Occupational History   Not on file  Tobacco Use   Smoking status: Every Day    Current packs/day: 0.25    Average packs/day: 0.3 packs/day for 30.0 years (7.5 ttl pk-yrs)    Types: Cigarettes   Smokeless tobacco: Never  Substance and Sexual Activity   Alcohol use: No   Drug use: No   Sexual activity: Not on file  Other Topics Concern   Not on file  Social History Narrative   ** Merged History Encounter **       Social Drivers of Health   Financial Resource Strain: Not on file  Food Insecurity: No Food Insecurity (03/07/2024)   Hunger Vital Sign    Worried About Running Out of Food in the Last Year: Never true    Ran Out of Food in the Last Year: Never true  Transportation Needs: No Transportation Needs (03/07/2024)   PRAPARE - Administrator, Civil Service (Medical): No    Lack of Transportation (Non-Medical): No  Physical Activity: Not on file  Stress: Not on file  Social Connections: Moderately Integrated (03/07/2024)   Social Connection and Isolation Panel    Frequency of Communication with Friends and Family: Three times a week    Frequency of Social Gatherings with Friends and Family: Three  times a week    Attends Religious Services: More than 4 times per year    Active Member of Clubs or Organizations: Yes    Attends Banker Meetings: Never    Marital Status: Separated  Intimate Partner  Violence: Not At Risk (03/07/2024)   Humiliation, Afraid, Rape, and Kick questionnaire    Fear of Current or Ex-Partner: No    Emotionally Abused: No    Physically Abused: No    Sexually Abused: No       Objective:  BP 117/64 (BP Location: Right Arm, Patient Position: Sitting, Cuff Size: Large)   Pulse 89   SpO2 95%  Wt Readings from Last 3 Encounters:  03/06/24 211 lb (95.7 kg)  04/19/17 211 lb (95.7 kg)  03/09/17 210 lb 12.8 oz (95.6 kg)   BMI Readings from Last 3 Encounters:  03/06/24 35.11 kg/m  04/19/17 35.11 kg/m  03/09/17 35.08 kg/m   SpO2 Readings from Last 3 Encounters:  06/28/24 95%  05/26/24 100%  03/20/24 97%    Physical Exam General: chronically ill appearing woman, not in any acute distress Eyes: PERRL, no scleral icterus ENMT: oropharynx clear, good dentition, no oral lesions, mallampati score IV Skin: warm, intact, no rashes Neck: JVD flat, ROM and lymph node assessment normal CV: RRR, no MRG, nl S1 and S2, 1+ pitting peripheral edema bilaterally Resp: clear to auscultation bilaterally, no wheezes, rales, or rhonchi, normal effort, no clubbing/cyanosis Abdom: Normoactive bowel sounds, soft, nontender, nondistended, no hepatosplenomegaly Ext: edema Neuro: Awake alert oriented to person place time and situation, L hemiparesis  Diagnostic Review:  Last CBC Lab Results  Component Value Date   WBC 7.9 05/25/2024   HGB 12.2 05/25/2024   HCT 36.0 05/25/2024   MCV 96.6 05/25/2024   MCH 31.3 05/25/2024   RDW 14.2 05/25/2024   PLT 292 05/25/2024      Latest Ref Rng & Units 05/25/2024    5:31 PM 05/25/2024    4:37 PM 03/08/2024    5:40 AM  BMP  Glucose 70 - 99 mg/dL  898  94   BUN 8 - 23 mg/dL  13  10   Creatinine 9.55 - 1.00 mg/dL  9.08  9.17   Sodium 864 - 145 mmol/L 143  142  142   Potassium 3.5 - 5.1 mmol/L 4.8  4.8  4.5   Chloride 98 - 111 mmol/L  107  107   CO2 22 - 32 mmol/L  25  24   Calcium  8.9 - 10.3 mg/dL  9.4  9.0    INR not checked  since 2017  I reviewed CT ABD/PLV on 03/06/2024 that shows a lingular solitary pulmonary nodule measuring 1.9 cm in largest dimension with some RLL consolidative opacities concerning for aspiration pneumonia. Repeat CT ABD/PLV on 05/25/2024 shows same lingular solitary pulmonary nodule now measuring 2.5 x 2 cm and prior consolidation in RLL appears to have improved.    Assessment & Plan:   Assessment & Plan Solitary pulmonary nodule Patient has a lingular solitary pulmonary nodule now measuring 2.5 x 2 cm increased in size from 1.9 cm in 3 months between April 2025 and July 2025 scan. The patient has a prior 25 PY smoking hx. She does have family hx of lung cancer in father. This may represent a primary lung neoplasm. I discussed with patient considering tissue biopsy with navigational bronchoscopy which she is agreeable to but would like me to discuss with her daughter. I tried to contact daughter multiple  times without success. I left her a voice message. I submitted an order for Super D CT and PET/CT for now. Will plan on navigational bronchoscopy next week. I ordered PFTs as well to assess baseline lung function.  Orders Placed This Encounter  Procedures   Procedural/ Surgical Case Request: VIDEO BRONCHOSCOPY WITH ENDOBRONCHIAL NAVIGATION   NM PET SUPER D CT   NM PET Image Initial (PI) Skull Base To Thigh   Pulmonary Function Test   I spent 60 minutes reviewing patient's chart including prior consultant notes, imaging, and PFTs as well as face-to-face with the patient, over half in discussion of the diagnosis and the importance of compliance with the treatment plan.  Return in about 4 weeks (around 07/26/2024).   Davisha Linthicum, MD

## 2024-06-28 NOTE — Telephone Encounter (Signed)
 Please schedule the following:  Provider performing procedure: Nyah Shepherd Diagnosis: LUL Solitary Pulmonary Nodule Which side if for nodule / mass? Left Procedure: Navigational bronchoscopy ION  Has patient been spoken to by Provider and given informed consent? Yes Anesthesia: General Do you need Fluro? Yes Duration of procedure: 2 hours Date: 07/05/2024 Alternate Date: N/A  Time: AM but after 9 AM since will need Dr. Andria to proctor Location: [facility] Endo Does patient have OSA? N/A DM? No Or Latex allergy? No Medication Restriction/ Anticoagulate/Antiplatelet: Aspirin  81 mg daily Pre-op Labs Ordered:determined by Anesthesia Imaging request: Patient needs super D CT and PET/CT before procedure. Both ordered (see OP encounter)  (If, SuperDimension CT Chest, please have STAT courier sent to ENDO)

## 2024-06-29 ENCOUNTER — Telehealth: Payer: Self-pay

## 2024-06-29 ENCOUNTER — Encounter: Payer: Self-pay | Admitting: Pulmonary Disease

## 2024-06-29 NOTE — Telephone Encounter (Signed)
 I have not. I spoke with Jamie Haas who is the Unit Manager at [facility] that handle's the patient's scheduling. I did not contact the patient's daughter to inform her.

## 2024-06-29 NOTE — Telephone Encounter (Addendum)
 Spoke to [place] [facility] [employer] April about the change of dates. Provided the information of the appointments we got scheduled for the patient. Faxing over the letter to April's Associate Professor. FAX#[phone]   Patient has been scheduled for Pet on 07/16/24 and Bronch 07/17/24.

## 2024-06-29 NOTE — Telephone Encounter (Signed)
 Copied from CRM [id number]. Topic: General - Call Back - No Documentation >> Jun 28, 2024  3:25 PM Joesph PARAS wrote: Reason for CRM: Patient's daughter returning call. No documentation. Patient's daughter is only available after 3PM. >> Jun 29, 2024 10:31 AM Isabell A wrote: Daughter returning call states she missed a phone call to follow up with what patient next procedure will be.  [phone]

## 2024-06-29 NOTE — Telephone Encounter (Signed)
 No documented of a call going to patient nor patient daughter no DPR for her   -NFN

## 2024-07-02 ENCOUNTER — Telehealth (HOSPITAL_BASED_OUTPATIENT_CLINIC_OR_DEPARTMENT_OTHER): Payer: Self-pay | Admitting: Pulmonary Disease

## 2024-07-02 NOTE — Telephone Encounter (Signed)
 Patient's daughter called regarding the phone calls that she continues to receive. At this time I do not see a DPR on file to reach out to her regarding her mothers results. She did state that she is the POA, but pt's chart does not reflect this.  She did state that it was a gentleman from the [facility] number wanting to go over her urgent results. At this time I am awaiting a response if this was her provider attempting to reach her. I did tell her I will reach back on this matter.  At this time nothing further, but she will also reach back out to the pt's facility, [facility], to see if her care staff has additional information.

## 2024-07-06 ENCOUNTER — Other Ambulatory Visit (HOSPITAL_COMMUNITY): Payer: Medicare (Managed Care)

## 2024-07-06 ENCOUNTER — Encounter (HOSPITAL_COMMUNITY): Payer: Medicare (Managed Care)

## 2024-07-11 ENCOUNTER — Ambulatory Visit (INDEPENDENT_AMBULATORY_CARE_PROVIDER_SITE_OTHER): Payer: Medicare (Managed Care) | Admitting: Internal Medicine

## 2024-07-11 DIAGNOSIS — R911 Solitary pulmonary nodule: Secondary | ICD-10-CM

## 2024-07-11 NOTE — Patient Instructions (Signed)
PFT not performed today.

## 2024-07-11 NOTE — Progress Notes (Signed)
 Patient not able to complete any parts of PFT. She had issue with clip being on her nose. She was not able to full grasp concept of deep breath on command nor blowing out. We worked on this 15 minutes and patient wanted to d/c test.

## 2024-07-12 ENCOUNTER — Telehealth (HOSPITAL_BASED_OUTPATIENT_CLINIC_OR_DEPARTMENT_OTHER): Payer: Self-pay

## 2024-07-12 NOTE — Telephone Encounter (Signed)
 Copied from CRM #[id number]. Topic: Clinical - Medical Advice >> Jul 11, 2024  3:02 PM Leila C wrote: Reason for CRM: Patient's child Ginger [phone] spoke with the nurse at [facility] nursing home, patient did not complete her PFT today. Ginger is asking why? Informed Ginger is not on DPR and cannot release patient's information. Tabitha states she's patient power of attorney, patient has dementia and cannot fill out forms. Ginger would like a call back. >> Jul 12, 2024  3:37 PM Leila C wrote: Patient's child Ginger [phone] states called yesterday about patient why patient did not complete her PFT and has heard from the office. Please advise and call back.

## 2024-07-13 NOTE — Telephone Encounter (Signed)
 I called tabitha and informed her that its OK she did not complete PFTs. I told her to still come in for CT scan on Monday and Biopsy on Tuesday. I told her to make sure that nursing home stops the patient's aspirin  81 mg starting today. Would you be able to reach out to nursing home and re-inforce that please?

## 2024-07-13 NOTE — Telephone Encounter (Signed)
 Per Grayce,  Patient not able to complete any parts of PFT. She had issue with clip being on her nose. She was not able to full grasp concept of deep breath on command nor blowing out. We worked on this 15 minutes and patient wanted to d/c test.   I called and spoke to to Pts daughter, Ginger. I informed Tabitha of Sonya note and Tabitha verbalized understanding. Ginger would like to know what needs to happen know since the PFT could not be preformed. Please advise.

## 2024-07-16 ENCOUNTER — Other Ambulatory Visit (HOSPITAL_COMMUNITY): Payer: Medicare (Managed Care)

## 2024-07-16 ENCOUNTER — Encounter (HOSPITAL_COMMUNITY)
Admission: RE | Admit: 2024-07-16 | Discharge: 2024-07-16 | Disposition: A | Discharge status: Home / Self Care | Payer: Medicare (Managed Care) | Source: Ambulatory Visit | Attending: Pulmonary Disease | Admitting: Pulmonary Disease

## 2024-07-16 ENCOUNTER — Ambulatory Visit: Payer: Self-pay | Admitting: Pulmonary Disease

## 2024-07-16 ENCOUNTER — Encounter (HOSPITAL_COMMUNITY): Payer: Self-pay | Admitting: Pulmonary Disease

## 2024-07-16 DIAGNOSIS — R918 Other nonspecific abnormal finding of lung field: Secondary | ICD-10-CM

## 2024-07-16 DIAGNOSIS — R590 Localized enlarged lymph nodes: Secondary | ICD-10-CM

## 2024-07-16 DIAGNOSIS — R911 Solitary pulmonary nodule: Secondary | ICD-10-CM

## 2024-07-16 LAB — GLUCOSE, CAPILLARY: Glucose-Capillary: 113 mg/dL — ABNORMAL HIGH (ref 70–99)

## 2024-07-16 MED ORDER — FLUDEOXYGLUCOSE F - 18 (FDG) INJECTION
10.2000 | Freq: Once | INTRAVENOUS | Status: AC
Start: 2024-07-16 — End: 2024-07-16
  Administered 2024-07-16: 10.2 via INTRAVENOUS

## 2024-07-16 NOTE — Telephone Encounter (Signed)
 Spoke with SANA at [facility] and she will hold ASA 81mg 

## 2024-07-16 NOTE — Progress Notes (Signed)
 Dr. Catherine made aware that patient's last dose of asa was 07-15-24 per nurse at [facility]. Nurse made aware to hold medication today

## 2024-07-16 NOTE — Progress Notes (Signed)
 SDW INSTRUCTIONS given:   Your procedure is scheduled on July 17, 2024.             Report to [facility] Main Entrance A at 5:30 A.M., and check in at the Admitting office.             Call this number if you have problems the morning of surgery:             [phone]               Remember:             Do not eat or drink after midnight the night before your surgery                              Take these medicines the morning of surgery with A SIP OF WATER  atorvastatin  (LIPITOR)  gabapentin  (NEURONTIN )  methimazole  (TAPAZOLE )  sertraline  (ZOLOFT )  SPIRIVA  RESPIMAT  Venlafaxine  HCl    IF NEEDED: albuterol  (VENTOLIN  HFA)  inhaler      As of today, STOP taking any Aspirin  (unless otherwise instructed by your surgeon) Aleve, Naproxen, Ibuprofen , Motrin , Advil , Goody's, BC's, all herbal medications, fish oil, and all vitamins. THIS INCLUDES YOUR diclofenac  sodium (VOLTAREN ).                        Do not wear jewelry, make up, or nail polish            Do not wear lotions, powders, perfumes/colognes, or deodorant.            Do not shave 48 hours prior to surgery.  Men may shave face and neck.            Do not bring valuables to the hospital.            [facility] is not responsible for any belongings or valuables.   Do NOT Smoke (Tobacco/Vaping) 24 hours prior to your procedure If you use a CPAP at night, you may bring all equipment for your overnight stay.   Contacts, glasses, dentures or bridgework may not be worn into surgery.      For patients admitted to the hospital, discharge time will be determined by your treatment team.   Patients discharged the day of surgery will not be allowed to drive home, and someone needs to stay with them for 24 hours.       Special instructions:   [place]- Preparing For Surgery   Before surgery, you can play an important role. Because skin is not sterile, your skin needs to be as free of germs as possible. You can  reduce the number of germs on your skin by washing with CHG (chlorahexidine gluconate) Soap before surgery.  CHG is an antiseptic cleaner which kills germs and bonds with the skin to continue killing germs even after washing.     Oral Hygiene is also important to reduce your risk of infection.  Remember - BRUSH YOUR TEETH THE MORNING OF SURGERY WITH YOUR REGULAR TOOTHPASTE   Please do not use if you have an allergy to CHG or antibacterial soaps. If your skin becomes reddened/irritated stop using the CHG.  Do not shave (including legs and underarms) for at least 48 hours prior to first CHG shower. It is OK to shave your face.   Please follow these instructions carefully.  Shower the NIGHT BEFORE SURGERY and the MORNING OF SURGERY with DIAL Soap.    Pat yourself dry with a CLEAN TOWEL.   Wear CLEAN PAJAMAS to bed the night before surgery   Place CLEAN SHEETS on your bed the night of your first shower and DO NOT SLEEP WITH PETS.     Day of Surgery: Please shower morning of surgery  Wear Clean/Comfortable clothing the morning of surgery Do not apply any deodorants/lotions.   Remember to brush your teeth WITH YOUR REGULAR TOOTHPASTE.   Questions were answered. Patient verbalized understanding of instructions.

## 2024-07-16 NOTE — Progress Notes (Signed)
 PCP - Tobie Awanda RAMAN., MD  Cardiologist -   PPM/ICD - denies Device Orders - n/a Rep Notified - n/a  Chest x-ray - 05-25-24 EKG - 05-25-24 Stress Test -  ECHO -  Cardiac Cath -   CPAP -   Dm-  Blood Thinner Instructions:  Aspirin  Instructions: Last dose 07-15-24  ERAS Protcol - NPO   COVID TEST- n/a  Anesthesia review: no  Patient verbally denies any shortness of breath, fever, cough and chest pain during phone call   -------------  SDW INSTRUCTIONS given:  Your procedure is scheduled on July 17, 2024.  Report to [facility] Main Entrance A at 5:30 A.M., and check in at the Admitting office.  Call this number if you have problems the morning of surgery:  [phone]   Remember:  Do not eat or drink after midnight the night before your surgery      Take these medicines the morning of surgery with A SIP OF WATER  atorvastatin  (LIPITOR)  gabapentin  (NEURONTIN )  methimazole  (TAPAZOLE )  sertraline  (ZOLOFT )  SPIRIVA  RESPIMAT  Venlafaxine  HCl   IF NEEDED: albuterol  (VENTOLIN  HFA)  inhaler    As of today, STOP taking any Aspirin  (unless otherwise instructed by your surgeon) Aleve, Naproxen, Ibuprofen , Motrin , Advil , Goody's, BC's, all herbal medications, fish oil, and all vitamins. THIS INCLUDES YOUR diclofenac  sodium (VOLTAREN ).                       Do not wear jewelry, make up, or nail polish            Do not wear lotions, powders, perfumes/colognes, or deodorant.            Do not shave 48 hours prior to surgery.  Men may shave face and neck.            Do not bring valuables to the hospital.            [facility] is not responsible for any belongings or valuables.  Do NOT Smoke (Tobacco/Vaping) 24 hours prior to your procedure If you use a CPAP at night, you may bring all equipment for your overnight stay.   Contacts, glasses, dentures or bridgework may not be worn into surgery.      For patients admitted to the hospital, discharge time will be  determined by your treatment team.   Patients discharged the day of surgery will not be allowed to drive home, and someone needs to stay with them for 24 hours.    Special instructions:   [facility]- Preparing For Surgery  Before surgery, you can play an important role. Because skin is not sterile, your skin needs to be as free of germs as possible. You can reduce the number of germs on your skin by washing with CHG (chlorahexidine gluconate) Soap before surgery.  CHG is an antiseptic cleaner which kills germs and bonds with the skin to continue killing germs even after washing.    Oral Hygiene is also important to reduce your risk of infection.  Remember - BRUSH YOUR TEETH THE MORNING OF SURGERY WITH YOUR REGULAR TOOTHPASTE  Please do not use if you have an allergy to CHG or antibacterial soaps. If your skin becomes reddened/irritated stop using the CHG.  Do not shave (including legs and underarms) for at least 48 hours prior to first CHG shower. It is OK to shave your face.  Please follow these instructions carefully.   Shower the [employer]  [facility]UR[facility] and the MORNING OF SURGERY with DIAL Soap.   Pat yourself dry with a CLEAN TOWEL.  Wear CLEAN PAJAMAS to bed the night before surgery  Place CLEAN SHEETS on your bed the night of your first shower and DO NOT SLEEP WITH PETS.   Day of Surgery: Please shower morning of surgery  Wear Clean/Comfortable clothing the morning of surgery Do not apply any deodorants/lotions.   Remember to brush your teeth WITH YOUR REGULAR TOOTHPASTE.   Questions were answered. Patient verbalized understanding of instructions.

## 2024-07-17 ENCOUNTER — Ambulatory Visit (HOSPITAL_COMMUNITY)
Admission: RE | Admit: 2024-07-17 | Payer: Medicare (Managed Care) | Source: Home / Self Care | Admitting: Pulmonary Disease

## 2024-07-17 ENCOUNTER — Encounter (HOSPITAL_COMMUNITY): Payer: Self-pay

## 2024-07-17 DIAGNOSIS — R911 Solitary pulmonary nodule: Secondary | ICD-10-CM | POA: Insufficient documentation

## 2024-07-17 SURGERY — VIDEO BRONCHOSCOPY WITH ENDOBRONCHIAL NAVIGATION
Anesthesia: General | Laterality: Left

## 2024-07-17 NOTE — Progress Notes (Signed)
 Karalee Wilkie POUR, MD  Sandrea Boer Approved for US  guided core biopsy of left supraclavicular LN for suspected metastatic lung cancer.  No sedation.  HKM       Previous Messages    ----- Message ----- From: Adilen Pavelko Sent: 07/17/2024   8:20 AM EDT To: Jensen Kilburg; Ir Procedure Requests Subject: US  Core Biopsy ( lymph nodes)                  Procedure : US  Core Biopsy ( Lymph nodes)  Reason :Left supraclavicular LN - PET AVID Dx: Lung mass [R91.8 (ICD-10-CM)]; Supraclavicular lymphadenopathy [R59.0 (ICD-10-CM)]    History : NM PET Initial skull base to thigh, NM PET Super D CT, CT abd pelv w/ , CT head wo   Provider : Catherine Cools, MD  Contact : [phone]

## 2024-07-18 ENCOUNTER — Encounter (HOSPITAL_COMMUNITY): Payer: Self-pay

## 2024-07-18 NOTE — Progress Notes (Signed)
 Karalee Wilkie POUR, MD  Roel Douthat Not necessary.  HKM       Previous Messages    ----- Message ----- From: Labrandon Knoch Sent: 07/17/2024   2:03 PM EDT To: Wilkie POUR Karalee, MD Subject: RE: US  Core Biopsy ( lymph nodes)              Hold aspirin  ? ----- Message ----- From: Karalee Wilkie POUR, MD Sent: 07/17/2024   9:31 AM EDT To: Eleanor Mighty Subject: RE: US  Core Biopsy ( lymph nodes)              Approved for US  guided core biopsy of left supraclavicular LN for suspected metastatic lung cancer.  No sedation.  HKM ----- Message ----- From: Daylyn Christine Sent: 07/17/2024   8:20 AM EDT To: Jaimy Kliethermes; Ir Procedure Requests Subject: US  Core Biopsy ( lymph nodes)                  Procedure : US  Core Biopsy ( Lymph nodes)  Reason :Left supraclavicular LN - PET AVID Dx: Lung mass [R91.8 (ICD-10-CM)]; Supraclavicular lymphadenopathy [R59.0 (ICD-10-CM)]    History : NM PET Initial skull base to thigh, NM PET Super D CT, CT abd pelv w/ , CT head wo   Provider : Catherine Cools, MD  Contact : [phone]

## 2024-07-18 NOTE — Progress Notes (Signed)
 @Patient  ID: Jamie Haas, female    DOB: [birth year], 78 y.o.   MRN: [id number]  Chief Complaint  Patient presents with   Follow-up    Referring provider: No ref. provider found  HPI: Jamie Haas is a 78 y/o female NH resident with PMH of CVA, HTN, allergic rhinitis, multifocal pneumonia, and lung nodule who presents for follow up after recent PET scan.  Today we discussed the results of her PET scan.  This includes a hypermetabolic lingular nodule as well as hypermetabolic left supraclavicular adenopathy; full results below.  Patient's daughter is active in her medical decision making and through discussion with Dr. Catherine  them it has been decided that the best place for biopsy would be the supraclavicular lymph node.  This is scheduled in IR for 07/27/2024.  Patient reports no complaints.  She states that she does have a cough at times but this is productive of clear sputum and is unchanged.  She denies any shortness of breath, chest pain, fever, chills, or other complaints.  She does report a family history of lung cancer in her father.  TEST/EVENTS :   PET scan 07/16/2024:  IMPRESSION: 1. 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 2. Cholelithiasis. 3.  Aortic atherosclerosis (ICD10-I70.0).    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   Arthritis    Depression    Hypertension    Stroke [facility]) 2011   left sided weakness    Tobacco History: Social History   Tobacco Use  Smoking Status Every Day   Current packs/day: 0.25   Average packs/day: 0.3 packs/day for 30.0 years (7.5 ttl pk-yrs)   Types: Cigarettes  Smokeless Tobacco Never   Ready to quit: Not Answered Counseling given: Not  Answered   Outpatient Medications Prior to Visit  Medication Sig Dispense Refill   albuterol  (VENTOLIN  HFA) 108 (90 Base) MCG/ACT inhaler Inhale 2 puffs into the lungs every 6 (six) hours as needed for wheezing or shortness of breath.     aspirin  EC 81 MG tablet Take 81 mg by mouth daily.     atorvastatin  (LIPITOR) 10 MG tablet Take 10 mg by mouth daily.     cholecalciferol (VITAMIN D) 1000 units tablet Take 1,000 Units by mouth daily.     diclofenac  sodium (VOLTAREN ) 1 % GEL Apply 2 g topically 4 (four) times daily. (Patient not taking: Reported on 07/16/2024) 100 g 0   Emollient (EUCERIN DAILY HYDRATION) CREA Apply 1 application  topically in the morning and at bedtime. (Patient not taking: Reported on 07/16/2024)     ferrous sulfate  324 MG TBEC Take 324 mg by mouth daily. (Patient not taking: Reported on 07/16/2024)     gabapentin  (NEURONTIN ) 100 MG capsule Take 100 mg by mouth daily.     melatonin 3 MG TABS tablet Take 6 mg by mouth at bedtime.     methimazole  (TAPAZOLE ) 5 MG tablet Take 5 mg by mouth daily.     mirtazapine  (REMERON ) 7.5 MG tablet Take 7.5 mg by mouth at bedtime.     sertraline  (ZOLOFT ) 50 MG tablet Take 50 mg by mouth daily.     Tiotropium Bromide  Monohydrate (SPIRIVA  RESPIMAT) 2.5 MCG/ACT AERS Inhale 2 puffs into the lungs  daily.     valACYclovir  (VALTREX ) 500 MG tablet Take 500 mg by mouth daily.     Venlafaxine  HCl 75 MG TB24 Take 1 tablet by mouth daily.     Zinc Oxide 10 % OINT Apply 1 application  topically in the morning and at bedtime. Apply to buttocks and groin every day and night shift. May also be applied as needed for skin irritation.     No facility-administered medications prior to visit.     Review of Systems: As per HPI  Constitutional:   No  weight loss, night sweats,  Fevers, chills, fatigue, or  lassitude.  HEENT:   No headaches,  Difficulty swallowing,  Tooth/dental problems, or  Sore throat,                No sneezing, itching, ear ache, nasal  congestion, post nasal drip,   CV:  No chest pain,  Orthopnea, PND, swelling in lower extremities, anasarca, dizziness, palpitations, syncope.   GI  No heartburn, indigestion, abdominal pain, nausea, vomiting, diarrhea, change in bowel habits, loss of appetite, bloody stools.   Resp: No shortness of breath with exertion or at rest.  No excess mucus, no productive cough,  No non-productive cough,  No coughing up of blood.  No change in color of mucus.  No wheezing.  No chest wall deformity  Skin: no rash or lesions.  GU: no dysuria, change in color of urine, no urgency or frequency.  No flank pain, no hematuria   MS:  No joint pain or swelling.  No decreased range of motion.  No back pain.    Physical Exam  BP 121/77   Pulse 86   SpO2 93%   GEN: A/Ox3; pleasant , NAD, sitting upright in the wheelchair.   HEENT:  [place]/AT,  EACs-clear, TMs-wnl, NOSE-clear, THROAT-clear, no lesions, no postnasal drip or exudate noted.   NECK:  Supple w/ fair ROM; no JVD; normal carotid impulses w/o bruits; no thyromegaly or nodules palpated; no lymphadenopathy.    RESP  Clear  P & A; w/o, wheezes/ rales/ or rhonchi. no accessory muscle use, no dullness to percussion  CARD:  RRR, no m/r/g, no peripheral edema, pulses intact, no cyanosis or clubbing.  GI:   Soft & nt; nml bowel sounds; no organomegaly or masses detected.   Musco: Warm bil, no deformities or joint swelling noted.   Neuro: alert, weakness of left upper and left lower extremity noted from prior CVA  Skin: Warm, no lesions or rashes    Lab Results:  CBC    Component Value Date/Time   WBC 7.9 05/25/2024 1637   RBC 3.80 (L) 05/25/2024 1637   HGB 12.2 05/25/2024 1731   HCT 36.0 05/25/2024 1731   PLT 292 05/25/2024 1637   MCV 96.6 05/25/2024 1637   MCH 31.3 05/25/2024 1637   MCHC 32.4 05/25/2024 1637   RDW 14.2 05/25/2024 1637   LYMPHSABS 2.3 05/25/2024 1637   MONOABS 0.5 05/25/2024 1637   EOSABS 0.3 05/25/2024 1637    BASOSABS 0.0 05/25/2024 1637    BMET    Component Value Date/Time   NA 143 05/25/2024 1731   NA 141 09/09/2016 0000   K 4.8 05/25/2024 1731   CL 107 05/25/2024 1637   CO2 25 05/25/2024 1637   GLUCOSE 101 (H) 05/25/2024 1637   BUN 13 05/25/2024 1637   BUN 17 09/09/2016 0000   CREATININE 0.91 05/25/2024 1637   CALCIUM  9.4 05/25/2024 1637   GFRNONAA >60 05/25/2024  1637   GFRAA >60 04/30/2016 0434    BNP No results found for: BNP  ProBNP No results found for: PROBNP  Imaging: NM PET SUPER D CT Result Date: 07/16/2024 CLINICAL DATA:  Lung nodule.  * Tracking Code: BO * EXAM: CT CHEST WITHOUT CONTRAST TECHNIQUE: Multidetector CT imaging of the chest was performed using thin slice collimation for electromagnetic bronchoscopy planning purposes, without intravenous contrast. RADIATION DOSE REDUCTION: This exam was performed according to the departmental dose-optimization program which includes automated exposure control, adjustment of the mA and/or kV according to patient size and/or use of iterative reconstruction technique. COMPARISON:  PET 07/16/2024, CT abdomen pelvis 05/25/2024. FINDINGS: Cardiovascular: Atherosclerotic calcification of the aorta and coronary arteries. Heart is enlarged. No pericardial effusion. Mediastinum/Nodes: No pathologically enlarged mediastinal or axillary lymph nodes. Hilar regions are difficult to definitively evaluate without IV contrast. Left supraclavicular mass measures 2.4 x 2.6 cm (2/12). Esophagus is grossly unremarkable. Lungs/Pleura: Centrilobular and paraseptal emphysema. Mild bibasilar dependent atelectasis. Persistent nodular consolidation, roughly measuring 2.2 x 2.4 cm, with increasing surrounding atelectasis. No pleural fluid. Debris in the airway. Upper Abdomen: Gallstone. Low-attenuation lesion in the upper pole left kidney. No specific follow-up necessary. Visualized portions of the liver, gallbladder, adrenal glands, kidneys, spleen, pancreas,  stomach and bowel are otherwise grossly unremarkable. No upper abdominal adenopathy. Musculoskeletal: Osteopenia. Degenerative changes in the spine. Upper/midthoracic kyphosis. Old manubrial fracture. IMPRESSION: 1. Persistent lingular nodule, indicative of primary bronchogenic carcinoma. Left supraclavicular nodal metastasis. PET dictated separately. 2. Cholelithiasis. 3. Aortic atherosclerosis (ICD10-I70.0). Coronary artery calcification. 4.  Emphysema (ICD10-J43.9). Electronically Signed   By: Newell Eke M.D.   On: 07/16/2024 13:23   NM PET Image Initial (PI) Skull Base To Thigh Result Date: 07/16/2024 CLINICAL DATA:  Initial treatment strategy for lung nodule. EXAM: NUCLEAR MEDICINE PET SKULL BASE TO THIGH TECHNIQUE: 10.2 mCi F-18 FDG was injected intravenously. Full-ring PET imaging was performed from the skull base to thigh after the radiotracer. CT data was obtained and used for attenuation correction and anatomic localization. Fasting blood glucose: 113 mg/dl COMPARISON:  CT chest 90/91/7974 and CT abdomen pelvis 05/25/2024. FINDINGS: Mediastinal blood pool activity: SUV max 1.9 Liver activity: SUV max NA NECK: Misregistration artifact with asymmetric right oropharyngeal activity and no definite CT correlate. Incidental CT findings: None. CHEST: Hypermetabolic lingular nodule measures approximately 2.1 x 2.4 cm, SUV max 14.0. Hypermetabolic left supraclavicular nodule measures 2.5 x 2.7 cm (4/30), SUV max 6.5. Incidental CT findings: CT chest same day dictated separately. ABDOMEN/PELVIS: No abnormal hypermetabolism.  Scattered muscular uptake. Incidental CT findings: Gallstones. Low-attenuation lesion in the left kidney. No specific follow-up necessary. Atherosclerotic calcification of the aorta. SKELETON: No abnormal hypermetabolism.  Fairly diffuse muscular uptake. Incidental CT findings: Osteopenia.  Degenerative changes in the spine. IMPRESSION: 1. 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 2. Cholelithiasis. 3.  Aortic atherosclerosis (ICD10-I70.0). Electronically Signed   By: Newell Eke M.D.   On: 07/16/2024 13:23    Administration History     None           No data to display          No results found for: NITRICOXIDE   Assessment & Plan:   Assessment & Plan Supraclavicular lymphadenopathy - Plan for biopsy in interventional radiology as scheduled for tissue diagnosis and staging purposes -Patient agreeable to plan of care - Heme-onc referral pending biopsy results Lung mass - As per  above   Return in about 3 weeks (around 08/13/2024) for biopsy results.  Candis Dandy, PA-C 07/23/2024

## 2024-07-23 ENCOUNTER — Encounter (HOSPITAL_BASED_OUTPATIENT_CLINIC_OR_DEPARTMENT_OTHER): Payer: Self-pay

## 2024-07-23 ENCOUNTER — Ambulatory Visit (INDEPENDENT_AMBULATORY_CARE_PROVIDER_SITE_OTHER): Payer: Medicare (Managed Care)

## 2024-07-23 VITALS — BP 121/77 | HR 86

## 2024-07-23 DIAGNOSIS — R590 Localized enlarged lymph nodes: Secondary | ICD-10-CM | POA: Diagnosis not present

## 2024-07-23 DIAGNOSIS — F17210 Nicotine dependence, cigarettes, uncomplicated: Secondary | ICD-10-CM

## 2024-07-23 DIAGNOSIS — R918 Other nonspecific abnormal finding of lung field: Secondary | ICD-10-CM

## 2024-07-23 NOTE — Patient Instructions (Signed)
 Lymph node biopsy scheduled for 07/27/2024 in Interventional Radiology:  arrival at 0800  Follow up in clinic in 2-3 weeks for results with Dr. Markel  Continue Spiriva ; use Albuterol  as needed.  Return to clinic sooner if new or worsening complaints

## 2024-07-24 ENCOUNTER — Ambulatory Visit: Payer: Medicare (Managed Care) | Admitting: Endocrinology

## 2024-07-24 ENCOUNTER — Encounter: Payer: Self-pay | Admitting: Endocrinology

## 2024-07-24 VITALS — BP 132/80 | HR 97 | Resp 16 | Ht 65.0 in | Wt 205.0 lb

## 2024-07-24 DIAGNOSIS — E0590 Thyrotoxicosis, unspecified without thyrotoxic crisis or storm: Secondary | ICD-10-CM

## 2024-07-24 DIAGNOSIS — E0500 Thyrotoxicosis with diffuse goiter without thyrotoxic crisis or storm: Secondary | ICD-10-CM | POA: Diagnosis not present

## 2024-07-24 NOTE — Progress Notes (Signed)
 Outpatient Endocrinology Note [place] Illias Pantano, MD  07/24/24  Patient's Name: Jamie Haas    DOB: November 07, 1946    MRN: [id number]  REASON OF VISIT: Follow-up for hyperthyroidism / Graves' disease  REFERRING PROVIDER:   PCP: System, Provider Not In  HISTORY OF PRESENT ILLNESS:   Jamie Haas is a 78 y.o. old female with past medical history as listed below is presented for a evaluation of hyperthyroidism.   Pertinent Thyroid  History: Referred for evaluation and management of hyperthyroidism, initial Endo consult in August 2024.  Patient had thyroid  lab TSH < 0.01 on May 06, 2023 and repeat thyroid  function test on May 18, 2023 reviewed from the medical record and noted as follows: TSH <0.01, reference range 0.27 - 4.20, Total T4 10.5, reference range 5.1-14.1, Thyroid  uptake 0.93, reference range 0.80-1.30. Free thyroxine index 0.2 low reference range 4.4-11.4.  Patient denies palpitation, heat intolerance, increased sweating.  No family history of thyroid  disorder.  Patient had cold symptoms /upper respiratory tract infection requiring oxygen therapy in June.  Does not recall having neck pain.  Patient denies taking steroid during that illness in June.  Denies CT scan with IV iodine  contrast.  - Laboratory evaluation in August 2024 : suppressed TSH with normal free T4 and free T3.  She had negative thyroglobulin antibody and thyroid  peroxidase antibody.  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  - In August 2024 started on methimazole  5 mg daily.   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  Interval history  Patient has been taking methimazole  5 mg daily.  Medication list from the facility reviewed.  Patient is accompanied by a staff from the facility in the clinic today.  Patient denies palpitation or  heat intolerance.  Overall feeling usual health.  Denies any GI upset or recent illness or fever.  No other complaints today.  She had thyroid  function test in July normal as follows.   Latest Reference Range & Units 05/25/24 18:59  TSH 0.350 - 4.500 uIU/mL 1.240  T4,Free(Direct) 0.61 - 1.12 ng/dL 9.08    REVIEW OF SYSTEMS:  As per history of present illness.   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   Arthritis    Depression    Hypertension    Stroke [facility]) 2011   left sided weakness    PAST SURGICAL HISTORY: Past Surgical History:  Procedure Laterality Date   BRAIN SURGERY  2011   coils inserted   HARDWARE REMOVAL Left 04/29/2016   Procedure: HARDWARE REMOVAL LEFT ANKLE;  Surgeon: Kay CHRISTELLA Cummins, MD;  Location: [facility] OR;  Service: Orthopedics;  Laterality: Left;   ORIF ANKLE FRACTURE Left 12/24/2015   Procedure: OPEN REDUCTION INTERNAL FIXATION (ORIF) ANKLE FRACTURE;  Surgeon: Kay CHRISTELLA Cummins, MD;  Location: [facility] OR;  Service: Orthopedics;  Laterality: Left;   TOTAL KNEE ARTHROPLASTY     TOTAL KNEE ARTHROPLASTY Right 04/29/2016   Procedure: RIGHT TOTAL KNEE ARTHROPLASTY;  Surgeon: Kay CHRISTELLA Cummins, MD;  Location: [facility] OR;  Service: Orthopedics;  Laterality: Right;   VULVAR LESION REMOVAL  03/17/2012   Procedure: VULVAR LESION;  Surgeon: Carlin DELENA Centers, MD;  Location: [facility] ORS;  Service: Gynecology;  Laterality: N/A;  CO2 Laser Vaporization Of Condyloma    ALLERGIES: No  Known Allergies  FAMILY HISTORY:  Family History  Problem Relation Age of Onset   Hypertension Mother    Osteoarthritis Mother    Cancer Mother     SOCIAL HISTORY: Social History   Socioeconomic History   Marital status: Legally Separated    Spouse name: Not on file   Number of children: Not on file   Years of education: Not on file   Highest education level: Not on file  Occupational History   Not on file  Tobacco Use   Smoking  status: Every Day    Current packs/day: 0.25    Average packs/day: 0.3 packs/day for 30.0 years (7.5 ttl pk-yrs)    Types: Cigarettes   Smokeless tobacco: Never  Substance and Sexual Activity   Alcohol use: No   Drug use: No   Sexual activity: Not on file  Other Topics Concern   Not on file  Social History Narrative   ** Merged History Encounter **       Social Drivers of Health   Financial Resource Strain: Not on file  Food Insecurity: No Food Insecurity (03/07/2024)   Hunger Vital Sign    Worried About Running Out of Food in the Last Year: Never true    Ran Out of Food in the Last Year: Never true  Transportation Needs: No Transportation Needs (03/07/2024)   PRAPARE - Administrator, Civil Service (Medical): No    Lack of Transportation (Non-Medical): No  Physical Activity: Not on file  Stress: Not on file  Social Connections: Moderately Integrated (03/07/2024)   Social Connection and Isolation Panel    Frequency of Communication with Friends and Family: Three times a week    Frequency of Social Gatherings with Friends and Family: Three times a week    Attends Religious Services: More than 4 times per year    Active Member of Clubs or Organizations: Yes    Attends Banker Meetings: Never    Marital Status: Separated    MEDICATIONS:  Current Outpatient Medications  Medication Sig Dispense Refill   albuterol  (VENTOLIN  HFA) 108 (90 Base) MCG/ACT inhaler Inhale 2 puffs into the lungs every 6 (six) hours as needed for wheezing or shortness of breath.     aspirin  EC 81 MG tablet Take 81 mg by mouth daily.     atorvastatin  (LIPITOR) 10 MG tablet Take 10 mg by mouth daily.     cholecalciferol (VITAMIN D) 1000 units tablet Take 1,000 Units by mouth daily.     diclofenac  sodium (VOLTAREN ) 1 % GEL Apply 2 g topically 4 (four) times daily. 100 g 0   Emollient (EUCERIN DAILY HYDRATION) CREA Apply 1 application  topically in the morning and at bedtime.      ferrous sulfate  324 MG TBEC Take 324 mg by mouth daily.     gabapentin  (NEURONTIN ) 100 MG capsule Take 100 mg by mouth daily.     melatonin 3 MG TABS tablet Take 6 mg by mouth at bedtime.     methimazole  (TAPAZOLE ) 5 MG tablet Take 5 mg by mouth daily.     mirtazapine  (REMERON ) 7.5 MG tablet Take 7.5 mg by mouth at bedtime.     sertraline  (ZOLOFT ) 50 MG tablet Take 50 mg by mouth daily.     Tiotropium Bromide  Monohydrate (SPIRIVA  RESPIMAT) 2.5 MCG/ACT AERS Inhale 2 puffs into the lungs daily.     valACYclovir  (VALTREX ) 500 MG tablet Take 500 mg by mouth daily.  Venlafaxine  HCl 75 MG TB24 Take 1 tablet by mouth daily.     Zinc Oxide 10 % OINT Apply 1 application  topically in the morning and at bedtime. Apply to buttocks and groin every day and night shift. May also be applied as needed for skin irritation.     No current facility-administered medications for this visit.    PHYSICAL EXAM: Vitals:   07/24/24 0824  BP: 132/80  Pulse: 97  Resp: 16  SpO2: 93%  Weight: 205 lb (93 kg)  Height: 5' 5 (1.651 m)    Body mass index is 34.11 kg/m.  Wt Readings from Last 3 Encounters:  07/24/24 205 lb (93 kg)  03/06/24 211 lb (95.7 kg)  04/19/17 211 lb (95.7 kg)     General: Well developed, well nourished female in no apparent distress.  HEENT: AT/[place], no external lesions. Hearing intact to the spoken word Eyes: No stare, proptosis or lid lag. Conjunctiva clear and no icterus. No erythema or watering Neck: Trachea midline, neck supple without appreciable thyromegaly or lymphadenopathy and no palpable thyroid  nodules Lungs: Clear to auscultation Heart: S1S2, Regular in rate and rhythm.  Abdomen: Soft Neurologic: Alert, oriented, normal speech, left-sided weakness Extremities: No pedal pitting edema. Skin: Warm, Psychiatric: Does not appear depressed or anxious  PERTINENT HISTORIC LABORATORY AND IMAGING STUDIES:  All pertinent laboratory results were reviewed. Please see HPI also for  further details.   TSH  Date Value Ref Range Status  05/25/2024 1.240 0.350 - 4.500 uIU/mL Final    Comment:    Performed by a 3rd Generation assay with a functional sensitivity of <=0.01 uIU/mL. Performed at [facility], [address] [place], [place] [address]   03/07/2024 1.856 0.350 - 4.500 uIU/mL Final    Comment:    Performed by a 3rd Generation assay with a functional sensitivity of <=0.01 uIU/mL. Performed at [facility], [address], [place], [place] [address]   12/22/2023 3.88 0.40 - 4.50 mIU/L Final    Lab Results  Component Value Date   FREET4 0.91 05/25/2024   FREET4 0.62 03/07/2024   FREET4 0.8 12/22/2023   T3FREE 2.3 12/22/2023   T3FREE 3.2 06/29/2023   TSH 1.240 05/25/2024   TSH 1.856 03/07/2024   TSH 3.88 12/22/2023    No results found for: THYROTRECAB  Lab Results  Component Value Date   TSH 1.240 05/25/2024   TSH 1.856 03/07/2024   TSH 3.88 12/22/2023   FREET4 0.91 05/25/2024   FREET4 0.62 03/07/2024   FREET4 0.8 12/22/2023     No results found for: TSI   No components found for: TRAB    ASSESSMENT / PLAN  1. Graves disease   2. Hyperthyroidism      -Patient has hyperthyroidism due to Graves' disease with positive thyrotropin receptor antibody.  Methimazole  5 mg daily was started in August 2024.  Currently taking methimazole  5 mg daily.  Thyroid  function test was normal in July.  She is clinically euthyroid in the clinic today.  Plan: - Continue current dose of methimazole  5 mg daily. - No labs today. - Follow-up in 6 months. - Written instruction provided for the facility, asked to complete thyroid  function test at the facility prior to follow-up visit and faxed to our clinic if possible.  Diagnoses and all orders for this visit:  Graves disease  Hyperthyroidism     DISPOSITION Follow up in clinic in 6 months suggested.  All questions answered and patient verbalized understanding  of the plan.  [place]  Aijah Lattner, MD [facility] Endocrinology [facility] [address] [place], [address] [place], [place] [address] Phone # [phone]   At least part of this note was generated using voice recognition software. Inadvertent word errors may have occurred, which were not recognized during the proofreading process.

## 2024-07-26 DIAGNOSIS — R911 Solitary pulmonary nodule: Secondary | ICD-10-CM | POA: Insufficient documentation

## 2024-07-27 ENCOUNTER — Ambulatory Visit (HOSPITAL_COMMUNITY)
Admission: RE | Admit: 2024-07-27 | Discharge: 2024-07-27 | Disposition: A | Discharge status: Home / Self Care | Payer: Medicare (Managed Care) | Source: Ambulatory Visit | Attending: Radiology | Admitting: Radiology

## 2024-07-27 ENCOUNTER — Other Ambulatory Visit: Payer: Self-pay | Admitting: Pulmonary Disease

## 2024-07-27 ENCOUNTER — Ambulatory Visit (HOSPITAL_COMMUNITY)
Admission: RE | Admit: 2024-07-27 | Discharge: 2024-07-27 | Disposition: A | Discharge status: Home / Self Care | Payer: Medicare (Managed Care) | Source: Ambulatory Visit | Attending: Pulmonary Disease | Admitting: Pulmonary Disease

## 2024-07-27 DIAGNOSIS — R221 Localized swelling, mass and lump, neck: Secondary | ICD-10-CM | POA: Diagnosis present

## 2024-07-27 DIAGNOSIS — R590 Localized enlarged lymph nodes: Secondary | ICD-10-CM

## 2024-07-27 DIAGNOSIS — R918 Other nonspecific abnormal finding of lung field: Secondary | ICD-10-CM

## 2024-07-27 HISTORY — PX: IR US LIVER BIOPSY: IMG936

## 2024-07-27 MED ORDER — LIDOCAINE-EPINEPHRINE 1 %-1:100000 IJ SOLN
INTRAMUSCULAR | Status: AC
  Filled 2024-07-27: qty 1

## 2024-07-27 NOTE — Procedures (Signed)
 Vascular and Interventional Radiology Procedure Note  Patient: Jamie Haas DOB: [birth year] Medical Record Number: [id number] Note Date/Time: 07/27/24 8:54 AM   Performing Physician: Thom Hall, MD Assistant(s): None  Diagnosis: L supraclavicular mass. Hx L lung mass   Procedure: LEFT SUPRACLAVICULAR MASS BIOPSY  Anesthesia: Local Anesthetic Complications: None Estimated Blood Loss: Minimal Specimens: Sent for Pathology  Findings:  Successful Ultrasound-guided biopsy of a L supraclavicular mass.  A total of 3 samples were obtained. Hemostasis of the tract was achieved using Manual Pressure.  Plan: Bed rest for 0 hours.  See detailed procedure note with images in PACS. The patient tolerated the procedure well without incident or complication and was returned to Recovery in stable condition.    Thom Hall, MD [facility] Rad[facility]   Pager. [phone] Clinic. [phone]

## 2024-07-30 LAB — SURGICAL PATHOLOGY

## 2024-07-31 ENCOUNTER — Ambulatory Visit: Payer: Self-pay | Admitting: Pulmonary Disease

## 2024-07-31 ENCOUNTER — Encounter: Payer: Self-pay | Admitting: Pulmonary Disease

## 2024-07-31 ENCOUNTER — Telehealth: Payer: Self-pay | Admitting: Pulmonary Disease

## 2024-07-31 DIAGNOSIS — R911 Solitary pulmonary nodule: Secondary | ICD-10-CM | POA: Insufficient documentation

## 2024-07-31 DIAGNOSIS — C3412 Malignant neoplasm of upper lobe, left bronchus or lung: Secondary | ICD-10-CM | POA: Insufficient documentation

## 2024-07-31 NOTE — Telephone Encounter (Signed)
 Patient's Scheduler is aware of appointment. She has written it down. Faxing over Letter to scheduler's office and mailing it. Routing to [employer] for [employer]

## 2024-07-31 NOTE — Telephone Encounter (Signed)
  Please schedule the following:   Provider performing procedure: Daine Gunther Diagnosis: LUL Solitary Pulmonary Nodule Which side if for nodule / mass? Left Procedure: Navigational bronchoscopy ION  Has patient been spoken to by Provider and given informed consent? Yes Anesthesia: General Do you need Fluro? Yes Duration of procedure: 2 hours Date: 10/10 at 7:30 AM Alternate Date: 10/13 afternoon Time: as above Location: [facility] Does patient have OSA? N/A DM? No Or Latex allergy? No Medication Restriction/ Anticoagulate/Antiplatelet: Aspirin  81 mg daily Pre-op Labs Ordered:determined by Anesthesia Imaging request: Already completed

## 2024-07-31 NOTE — Addendum Note (Signed)
 Addended by: Karrington Studnicka on: 07/31/2024 09:43 AM   Modules accepted: Orders

## 2024-08-09 ENCOUNTER — Ambulatory Visit (HOSPITAL_COMMUNITY): Payer: Medicare (Managed Care)

## 2024-08-15 ENCOUNTER — Telehealth (HOSPITAL_BASED_OUTPATIENT_CLINIC_OR_DEPARTMENT_OTHER): Payer: Self-pay | Admitting: Pulmonary Disease

## 2024-08-15 ENCOUNTER — Ambulatory Visit (HOSPITAL_BASED_OUTPATIENT_CLINIC_OR_DEPARTMENT_OTHER): Payer: Medicare (Managed Care) | Admitting: Pulmonary Disease

## 2024-08-15 NOTE — Telephone Encounter (Signed)
 Called Tabitha and confirmed the plan that patient will get lung biopsy on Friday 10/10. She knows she has to be there at 5:30 AM. I informed her aspirin  needs to be stopped today.

## 2024-08-16 ENCOUNTER — Other Ambulatory Visit: Payer: Self-pay

## 2024-08-16 NOTE — Progress Notes (Signed)
 SDW instructions attempt to [facility] [phone]. Was placed on hold for 12 minutes and then another 5 minutes. Unable to verify surgical/medical history, allergies, medications, ht, wt, etc. Instructions were faxed to [phone] as no nurse available.

## 2024-08-16 NOTE — Progress Notes (Signed)
 Surgical Instructions   Your procedure is scheduled on Friday August 17, 2024. Report to [facility] Main Entrance A at 5:30 A.M., then check in with the Admitting office. Any questions or running late day of surgery: call [phone]  Questions prior to your surgery date: call [phone], Monday-Friday, 8am-4pm. If you experience any cold or flu symptoms such as cough, fever, chills, shortness of breath, etc. between now and your scheduled surgery, please notify us  at the above number.     Remember:  Do not eat or drink after midnight the night before your surgery  Take these medicines the morning of surgery with A SIP OF WATER  atorvastatin  (LIPITOR)  gabapentin  (NEURONTIN )  methimazole  (TAPAZOLE )  sertraline  (ZOLOFT )  sertraline  (ZOLOFT )  Tiotropium Bromide  Monohydrate (SPIRIVA  RESPIMAT)  valACYclovir  (VALTREX )  Venlafaxine  HCl    May take these medicines IF NEEDED: albuterol  (VENTOLIN  HFA) 108 (90 Base) MCG/ACT inhaler   PER YOUR SURGEON'S INSTRUCTIONS YOU LAST DOSE OF ASPIRIN  SHOULD HAVE BEEN 08/15/2024  One week prior to surgery, STOP taking any Aleve, Naproxen, Ibuprofen , Motrin , Advil , Goody's, BC's, all herbal medications, fish oil, and non-prescription vitamins. This includes your diclofenac  sodium (VOLTAREN ) 1 % GEL                      Do NOT Smoke (Tobacco/Vaping) for 24 hours prior to your procedure.  If you use a CPAP at night, you may bring your mask/headgear for your overnight stay.   You will be asked to remove any contacts, glasses, piercing's, hearing aid's, dentures/partials prior to surgery. Please bring cases for these items if needed.    Patients discharged the day of surgery will not be allowed to drive home, and someone needs to stay with them for 24 hours.  SURGICAL WAITING ROOM VISITATION Patients may have no more than 2 support people in the waiting area - these visitors may rotate.   Pre-op nurse will coordinate an appropriate time for 1  ADULT support person, who may not rotate, to accompany patient in pre-op.  Children under the age of 99 must have an adult with them who is not the patient and must remain in the main waiting area with an adult.  If the patient needs to stay at the hospital during part of their recovery, the visitor guidelines for inpatient rooms apply.  Please refer to the [facility] website for the visitor guidelines for any additional information.   If you received a COVID test during your pre-op visit  it is requested that you wear a mask when out in public, stay away from anyone that may not be feeling well and notify your surgeon if you develop symptoms. If you have been in contact with anyone that has tested positive in the last 10 days please notify you surgeon.      Pre-operative Bathing Instructions   You can play a key role in reducing the risk of infection after surgery. Your skin needs to be as free of germs as possible. You can reduce the number of germs on your skin by washing with  soap before surgery.              TAKE A SHOWER THE NIGHT BEFORE SURGERY   Please keep in mind the following:  DO NOT shave, including legs and underarms, 48 hours prior to surgery.   Place clean sheets on your bed the night before surgery Use a clean washcloth (not used since being washed) for shower.  Shower Instructions:  Oncologist and private area with normal soap. If you choose to wash your hair, wash first with your normal shampoo.  After you use shampoo/soap, rinse your hair and body thoroughly to remove shampoo/soap residue.  3.   Pat dry with a clean towel  4.   Put on clean pajamas    Additional instructions for the day of surgery: If you choose, you may shower the morning of surgery with an antibacterial soap.  DO NOT APPLY any lotions, deodorants or perfumes.   Do not wear jewelry or makeup Do not wear nail polish, gel polish, artificial nails, or any other type of covering on natural  nails (fingers and toes) Do not bring valuables to the hospital. [facility] is not responsible for valuables/personal belongings. Put on clean/comfortable clothes.  Please brush your teeth.  Ask your nurse before applying any prescription medications to the skin.

## 2024-08-17 ENCOUNTER — Encounter (HOSPITAL_COMMUNITY)
Admission: RE | Disposition: A | Discharge status: Skilled Nursing Facility | Payer: Self-pay | Source: Home / Self Care | Attending: Pulmonary Disease

## 2024-08-17 ENCOUNTER — Ambulatory Visit (HOSPITAL_COMMUNITY): Payer: Medicare (Managed Care) | Admitting: Anesthesiology

## 2024-08-17 ENCOUNTER — Ambulatory Visit (HOSPITAL_COMMUNITY): Payer: Medicare (Managed Care)

## 2024-08-17 ENCOUNTER — Ambulatory Visit (HOSPITAL_COMMUNITY)
Admission: RE | Admit: 2024-08-17 | Discharge: 2024-08-17 | Disposition: A | Discharge status: Skilled Nursing Facility | Payer: Medicare (Managed Care) | Attending: Pulmonary Disease | Admitting: Pulmonary Disease

## 2024-08-17 ENCOUNTER — Other Ambulatory Visit: Payer: Self-pay

## 2024-08-17 ENCOUNTER — Encounter (HOSPITAL_COMMUNITY): Payer: Self-pay | Admitting: Pulmonary Disease

## 2024-08-17 DIAGNOSIS — I10 Essential (primary) hypertension: Secondary | ICD-10-CM

## 2024-08-17 DIAGNOSIS — Z87891 Personal history of nicotine dependence: Secondary | ICD-10-CM

## 2024-08-17 DIAGNOSIS — R911 Solitary pulmonary nodule: Secondary | ICD-10-CM

## 2024-08-17 DIAGNOSIS — I69354 Hemiplegia and hemiparesis following cerebral infarction affecting left non-dominant side: Secondary | ICD-10-CM | POA: Diagnosis not present

## 2024-08-17 DIAGNOSIS — R918 Other nonspecific abnormal finding of lung field: Secondary | ICD-10-CM

## 2024-08-17 DIAGNOSIS — E785 Hyperlipidemia, unspecified: Secondary | ICD-10-CM | POA: Insufficient documentation

## 2024-08-17 DIAGNOSIS — C3412 Malignant neoplasm of upper lobe, left bronchus or lung: Secondary | ICD-10-CM | POA: Insufficient documentation

## 2024-08-17 DIAGNOSIS — Z801 Family history of malignant neoplasm of trachea, bronchus and lung: Secondary | ICD-10-CM | POA: Diagnosis not present

## 2024-08-17 HISTORY — PX: VIDEO BRONCHOSCOPY WITH ENDOBRONCHIAL NAVIGATION: SHX6175

## 2024-08-17 HISTORY — DX: Hemiplegia, unspecified affecting unspecified side: G81.90

## 2024-08-17 HISTORY — PX: ENDOBRONCHIAL ULTRASOUND: SHX5096

## 2024-08-17 HISTORY — DX: Unspecified dementia, unspecified severity, without behavioral disturbance, psychotic disturbance, mood disturbance, and anxiety: F03.90

## 2024-08-17 LAB — BASIC METABOLIC PANEL WITH GFR
Anion gap: 15 (ref 5–15)
BUN: 12 mg/dL (ref 8–23)
CO2: 25 mmol/L (ref 22–32)
Calcium: 9.3 mg/dL (ref 8.9–10.3)
Chloride: 103 mmol/L (ref 98–111)
Creatinine, Ser: 0.9 mg/dL (ref 0.44–1.00)
GFR, Estimated: >60 mL/min (ref >60)
Glucose, Bld: 108 mg/dL — ABNORMAL HIGH (ref 70–99)
Potassium: 4.4 mmol/L (ref 3.5–5.1)
Sodium: 143 mmol/L (ref 135–145)

## 2024-08-17 LAB — CBC
HCT: 38.6 % (ref 36.0–46.0)
Hemoglobin: 12.1 g/dL (ref 12.0–15.0)
MCH: 30.2 pg (ref 26.0–34.0)
MCHC: 31.3 g/dL (ref 30.0–36.0)
MCV: 96.3 fL (ref 80.0–100.0)
Platelets: 310 K/uL (ref 150–400)
RBC: 4.01 MIL/uL (ref 3.87–5.11)
RDW: 14.4 % (ref 11.5–15.5)
WBC: 9.1 K/uL (ref 4.0–10.5)
nRBC: 0 % (ref 0.0–0.2)

## 2024-08-17 LAB — GLUCOSE, CAPILLARY
Glucose-Capillary: 111 mg/dL — ABNORMAL HIGH (ref 70–99)
Glucose-Capillary: 124 mg/dL — ABNORMAL HIGH (ref 70–99)

## 2024-08-17 SURGERY — VIDEO BRONCHOSCOPY WITH ENDOBRONCHIAL NAVIGATION
Anesthesia: General | Laterality: Left

## 2024-08-17 MED ORDER — LACTATED RINGERS IV SOLN: INTRAVENOUS | Status: DC

## 2024-08-17 MED ORDER — ROCURONIUM BROMIDE 10 MG/ML (PF) SYRINGE
PREFILLED_SYRINGE | INTRAVENOUS | Status: DC | PRN
  Administered 2024-08-17: 50 mg via INTRAVENOUS
  Administered 2024-08-17 (×2): 10 mg via INTRAVENOUS

## 2024-08-17 MED ORDER — EPINEPHRINE 1 MG/10ML IV SOSY
PREFILLED_SYRINGE | INTRAVENOUS | Status: DC | PRN
  Administered 2024-08-17: 1 mL via INTRATRACHEAL

## 2024-08-17 MED ORDER — ONDANSETRON HCL 4 MG/2ML IJ SOLN
INTRAMUSCULAR | Status: DC | PRN
  Administered 2024-08-17: 4 mg via INTRAVENOUS

## 2024-08-17 MED ORDER — INSULIN ASPART 100 UNIT/ML IJ SOLN: 0.0000 [IU] | INTRAMUSCULAR | Status: DC | PRN

## 2024-08-17 MED ORDER — FENTANYL CITRATE (PF) 100 MCG/2ML IJ SOLN: 25.0000 ug | INTRAMUSCULAR | Status: DC | PRN

## 2024-08-17 MED ORDER — PROPOFOL 10 MG/ML IV BOLUS
INTRAVENOUS | Status: DC | PRN
  Administered 2024-08-17: 80 mg via INTRAVENOUS
  Administered 2024-08-17: 50 mg via INTRAVENOUS

## 2024-08-17 MED ORDER — FENTANYL CITRATE (PF) 100 MCG/2ML IJ SOLN
INTRAMUSCULAR | Status: AC
  Filled 2024-08-17: qty 2

## 2024-08-17 MED ORDER — DROPERIDOL 2.5 MG/ML IJ SOLN: 0.6250 mg | Freq: Once | INTRAMUSCULAR | Status: DC | PRN

## 2024-08-17 MED ORDER — PHENYLEPHRINE 80 MCG/ML (10ML) SYRINGE FOR IV PUSH (FOR BLOOD PRESSURE SUPPORT)
PREFILLED_SYRINGE | INTRAVENOUS | Status: DC | PRN
  Administered 2024-08-17: 120 ug via INTRAVENOUS
  Administered 2024-08-17 (×3): 80 ug via INTRAVENOUS

## 2024-08-17 MED ORDER — SUGAMMADEX SODIUM 200 MG/2ML IV SOLN
INTRAVENOUS | Status: DC | PRN
  Administered 2024-08-17 (×2): 200 mg via INTRAVENOUS

## 2024-08-17 MED ORDER — EPINEPHRINE 1 MG/10ML IV SOSY
PREFILLED_SYRINGE | INTRAVENOUS | Status: AC
  Filled 2024-08-17: qty 10

## 2024-08-17 MED ORDER — ACETAMINOPHEN 10 MG/ML IV SOLN: 1000.0000 mg | Freq: Once | INTRAVENOUS | Status: DC | PRN

## 2024-08-17 MED ORDER — LIDOCAINE 2% (20 MG/ML) 5 ML SYRINGE
INTRAMUSCULAR | Status: DC | PRN
  Administered 2024-08-17: 100 mg via INTRAVENOUS

## 2024-08-17 MED ORDER — FENTANYL CITRATE (PF) 250 MCG/5ML IJ SOLN
INTRAMUSCULAR | Status: DC | PRN
  Administered 2024-08-17 (×2): 50 ug via INTRAVENOUS

## 2024-08-17 MED ORDER — CHLORHEXIDINE GLUCONATE 0.12 % MT SOLN
OROMUCOSAL | Status: AC
  Administered 2024-08-17: 15 mL via OROMUCOSAL
  Filled 2024-08-17: qty 15

## 2024-08-17 MED ORDER — IPRATROPIUM-ALBUTEROL 0.5-2.5 (3) MG/3ML IN SOLN
3.0000 mL | RESPIRATORY_TRACT | Status: DC
  Administered 2024-08-17: 3 mL via RESPIRATORY_TRACT

## 2024-08-17 MED ORDER — CHLORHEXIDINE GLUCONATE 0.12 % MT SOLN: 15.0000 mL | Freq: Once | OROMUCOSAL | Status: AC

## 2024-08-17 MED ORDER — PROPOFOL 500 MG/50ML IV EMUL
INTRAVENOUS | Status: DC | PRN
  Administered 2024-08-17: 80 ug/kg/min via INTRAVENOUS

## 2024-08-17 MED ORDER — DEXAMETHASONE SOD PHOSPHATE PF 10 MG/ML IJ SOLN
INTRAMUSCULAR | Status: DC | PRN
  Administered 2024-08-17: 10 mg via INTRAVENOUS

## 2024-08-17 NOTE — Op Note (Signed)
 Procedure Note  Patient: Jamie Haas  [employer] Cios mobile C-arm was utilized to identify and biopsy lingula mass.  Needle-in-lesion was confirmed using real-time Cios imaging, and images were uploaded to PACS.      Paula Southerly, MD [facility]

## 2024-08-17 NOTE — Op Note (Addendum)
 Video Bronchoscopy with Robotic Assisted Bronchoscopic Navigation   Date of Operation: 08/17/2024   Pre-op Diagnosis: Lingular mass  Post-op Diagnosis: Same  Surgeon: Paula Southerly, MD  Assistants: Belva Manchester, MD  Anesthesia: General endotracheal anesthesia  Operation: Flexible video fiberoptic bronchoscopy with robotic assistance and biopsies.  Estimated Blood Loss: Minimal  Complications: None  Indications and History: SHRONDA BOEH is a 78 y.o. female with history of CVA and lingula mass.  Recommendation made to achieve a tissue diagnosis via robotic assisted navigational bronchoscopy.  The risks, benefits, complications, treatment options and expected outcomes were discussed with the patient.  The possibilities of pneumothorax, pneumonia, reaction to medication, pulmonary aspiration, perforation of a viscus, bleeding, failure to diagnose a condition and creating a complication requiring transfusion or operation were discussed with the patient who freely signed the consent.    Description of Procedure: The patient was seen in the Preoperative Area, was examined and was deemed appropriate to proceed.  The patient was taken to [facility] Endoscopy room 3, identified as Othel LITTIE Sar and the procedure verified as Flexible Video Fiberoptic Bronchoscopy.  A Time Out was held and the above information confirmed.   Prior to the date of the procedure a high-resolution CT scan of the chest was performed. Utilizing ION software program a virtual tracheobronchial tree was generated to allow the creation of distinct navigation pathways to the patient's parenchymal abnormalities. After being taken to the operating room general anesthesia was initiated and the patient  was orally intubated. The video fiberoptic bronchoscope was introduced via the endotracheal tube and a general inspection was performed which showed normal right and left lung anatomy. Aspiration of the bilateral mainstems was  completed to remove any remaining secretions. Robotic catheter inserted into patient's endotracheal tube.   Target #1 - Lingula Mass: The distinct navigation pathways prepared prior to this procedure were then utilized to navigate to patient's lesion identified on CT scan. The robotic catheter was secured into place and the vision probe was withdrawn.  Lesion location was approximated using fluoroscopy.  Local registration and targeting was performed using [employer] Cios mobile C-arm three-dimensional imaging. Under fluoroscopic guidance transbronchial needle brushings, transbronchial needle biopsies, and transbronchial forceps biopsies were performed to be sent for cytology and pathology.  Needle-in-lesion was confirmed using Cios mobile C-arm.  A bronchioalveolar lavage was performed in the lingula and sent for cytology and microbiology.  At the end of the procedure a general airway inspection was performed and there was no evidence of active bleeding. The bronchoscope was removed.  The patient tolerated the procedure well. There was no significant blood loss and there were no obvious complications. A post-procedural chest x-ray is pending.  Samples Target #1: 1. Transbronchial needle brushings from lingula mass 2. Transbronchial Wang needle biopsies from lingula mass 3. Transbronchial forceps biopsies from lingula mass 4. Bronchoalveolar lavage from Lingular superior segment  Endobronchial Ultrasound was utilized to evaluate lymphadenopathy. First 11 R superior was evaluated and that was small and so was not biopsied. 10 R absent. 4 R < 5 mm and not biopsied. 7 was > 5 mm but appeared relatively normal and not biopsied. 11 L appeared enlarged but it is a N3 node and so was not biopsied. PET/CT shows a hypermetabolic left supraclavicular lymph node that is likely to be metastatic disease. 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  Plans:  The patient will be discharged  from the PACU to home when recovered from anesthesia and after  chest x-ray is reviewed. We will review the cytology, pathology and microbiology results with the patient when they become available. Outpatient followup will be with me.   Paula Southerly, MD [facility]

## 2024-08-17 NOTE — H&P (Signed)
 Jamie Haas is a 78 y.o. female with arthritis, MDD, HTN, Grave's disease, CVA with L hemiparesis who presents for navigational bronchoscopy in the setting of lingular mass.   The patient sent to the ED for AMS on 02/2024. Admitted for suspected sepsis with pneumonia. Treated with antimicrobials. Incidentally noted to have lingular nodule on CT ABD/PLV 02/2024. She later went to ED for AMS and abdominal pain. Had repeat CT ABD/PLV 05/2024 showing the same lingular nodule but increasing in size compared to prior. She was referred to pulmonology for further evaluation of that. I saw her in clinic 06/28/24 and at the time I ordered PET/CT and navigational CT Super D. PET/CT showed hypermetabolic L supraclavicular mass. IR biopsy was ordered and performed but pathology was negative for malignancy. Therefore, I recommended to her daughter to pursue lung biopsy for hypermetabolic lingular mass.   Today, patient feels well. No complaints. A little nervous about procedure. Per family including daughter, the patient had not gotten her aspirin  after reviewing with the nursing home.  PMH: - HTN - Hyperthyroidism - HLD - Stroke with L hemiparesis  Social Hx: - 25 PY smoking hx, quit 5 years ago  Family Hx: father died of lung cancer  Allergies: none  Notable medications include Aspirin  81 mg daily  Physical Examination: General: NAD, alert, WD, WN Eyes: PERRL, no scleral icterus ENMT: oropharynx clear, good dentition, no oral lesions, mallampati score IV Skin: warm, intact, no rashes Neck: JVD flat, ROM and lymph node assessment normal CV: RRR, no MRG, nl S1 and S2, 1+ pitting peripheral edema Resp: clear to auscultation bilaterally, no wheezes, rales, or rhonchi, normal effort, no clubbing/cyanosis Abdom: Normoactive bowel sounds, soft, nontender, nondistended, no hepatosplenomegaly Neuro: Awake alert oriented to person place time and situation  I reviewed her imaging independently and it  shows a hypermertabolic mass in LUL concerning for primary lung neoplasm. This was present on prior CT ABD/PLV and has shown interval growth.  Assessment and Plan: - Will pursue robotic navigational bronchsoscopy for tissue diagnosis under general anesthesia. - Will send samples for histopathology, cytology, and microbiology.  Paula Southerly, MD [facility]

## 2024-08-17 NOTE — Anesthesia Procedure Notes (Signed)
 Procedure Name: Intubation Date/Time: 08/17/2024 7:47 AM  Performed by: Indra Wolters J, CRNAPre-anesthesia Checklist: Patient identified, Emergency Drugs available, Suction available and Patient being monitored Patient Re-evaluated:Patient Re-evaluated prior to induction Oxygen Delivery Method: Circle System Utilized Preoxygenation: Pre-oxygenation with 100% oxygen Induction Type: IV induction Ventilation: Mask ventilation without difficulty Laryngoscope Size: Miller and 3 Grade View: Grade I Tube type: Oral Tube size: 8.5 mm Number of attempts: 1 Airway Equipment and Method: Stylet and Oral airway Placement Confirmation: ETT inserted through vocal cords under direct vision, positive ETCO2 and breath sounds checked- equal and bilateral Secured at: 21 cm Tube secured with: Tape Dental Injury: Teeth and Oropharynx as per pre-operative assessment

## 2024-08-17 NOTE — Transfer of Care (Signed)
 Immediate Anesthesia Transfer of Care Note  Patient: Jamie Haas  Procedure(s) Performed: VIDEO BRONCHOSCOPY WITH ENDOBRONCHIAL NAVIGATION (Left) ENDOBRONCHIAL ULTRASOUND (EBUS) (Bilateral)  Patient Location: PACU  Anesthesia Type:General  Level of Consciousness: drowsy  Airway & Oxygen Therapy: Patient Spontanous Breathing and Patient connected to face mask oxygen  Post-op Assessment: Report given to RN and Post -op Vital signs reviewed and stable  Post vital signs: Reviewed and stable  Last Vitals:  Vitals Value Taken Time  BP 121/71 08/17/24 09:30  Temp    Pulse 108 08/17/24 09:30  Resp 28 08/17/24 09:30  SpO2 97 % 08/17/24 09:30  Vitals shown include unfiled device data.  Last Pain:  Vitals:   08/17/24 0641  TempSrc:   PainSc: 0-No pain         Complications: No notable events documented.

## 2024-08-17 NOTE — Progress Notes (Signed)
 Called transporter and set them up for text message updates - [phone].  Tried calling DON for [employer] and no answer left message [phone].  Trying to reach RN at [phone] - No answer, I left a message to return call to [phone].

## 2024-08-17 NOTE — Anesthesia Postprocedure Evaluation (Signed)
 Anesthesia Post Note  Patient: Jamie Haas  Procedure(s) Performed: VIDEO BRONCHOSCOPY WITH ENDOBRONCHIAL NAVIGATION (Left) ENDOBRONCHIAL ULTRASOUND (EBUS) (Bilateral)     Patient location during evaluation: PACU Anesthesia Type: General Level of consciousness: awake and alert Pain management: pain level controlled Vital Signs Assessment: post-procedure vital signs reviewed and stable Respiratory status: spontaneous breathing, nonlabored ventilation, respiratory function stable and patient connected to nasal cannula oxygen Cardiovascular status: blood pressure returned to baseline and stable Postop Assessment: no apparent nausea or vomiting Anesthetic complications: no   No notable events documented.  Last Vitals:  Vitals:   08/17/24 1130 08/17/24 1145  BP: 110/66 110/61  Pulse: (!) 108 (!) 109  Resp: (!) 22 (!) 23  Temp:  36.5 C  SpO2: 97% 93%    Last Pain:  Vitals:   08/17/24 1145  TempSrc:   PainSc: Asleep                 Rome Ade

## 2024-08-17 NOTE — Anesthesia Preprocedure Evaluation (Signed)
 Anesthesia Evaluation  Patient identified by MRN, date of birth, ID band Patient awake  General Assessment Comment:Patient AO x 4, able to provide consent in my opinion for this procedure.  Reviewed: Allergy & Precautions, NPO status , Patient's Chart, lab work & pertinent test results  History of Anesthesia Complications Negative for: history of anesthetic complications  Airway Mallampati: II  TM Distance: >3 FB Neck ROM: Full    Dental  (+) Edentulous Upper, Edentulous Lower   Pulmonary neg sleep apnea, COPD, Patient abstained from smoking.Not current smoker, former smoker    + decreased breath sounds      Cardiovascular Exercise Tolerance: Good METShypertension, Pt. 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 No date: Arthritis 02/07/2024: COPD (chronic obstructive pulmonary disease) (HCC) No date: Dementia (HCC) No date: Depression 07/04/2019: Diabetes mellitus without complication (HCC)     Comment:  Patient denies diabetes No date: Hemiplegia (HCC)     Comment:  L side No date: Hypertension 11/08/2009: Stroke [facility])     Comment:  left sided weakness  Reproductive/Obstetrics                              Anesthesia Physical Anesthesia Plan  ASA: 3  Anesthesia Plan: General   Post-op Pain Management: Minimal or no pain anticipated   Induction: Intravenous  PONV Risk Score and Plan: 3 and Ondansetron , Dexamethasone  and  Treatment may vary due to age or medical condition  Airway Management Planned: Oral ETT  Additional Equipment: None  Intra-op Plan:   Post-operative Plan: Extubation in OR  Informed Consent: I have reviewed the patients History and Physical, chart, labs and discussed the procedure including the risks, benefits and alternatives for the proposed anesthesia with the patient or authorized representative who has indicated his/her understanding and acceptance.     Dental advisory given  Plan Discussed with: CRNA and Surgeon  Anesthesia Plan Comments: (Discussed risks of anesthesia with patient and her daughter at bedside, including PONV, sore throat, lip/dental/eye damage, post operative cognitive dysfunction. Rare risks discussed as well, such as cardiorespiratory and neurological sequelae, and allergic reactions. Discussed the role of CRNA in patient's perioperative care. Patient understands.)        Anesthesia Quick Evaluation

## 2024-08-17 NOTE — Discharge Instructions (Signed)

## 2024-08-19 ENCOUNTER — Encounter (HOSPITAL_COMMUNITY): Payer: Self-pay | Admitting: Pulmonary Disease

## 2024-08-19 LAB — ACID FAST SMEAR (AFB, MYCOBACTERIA): Acid Fast Smear: NEGATIVE

## 2024-08-20 LAB — CULTURE, BAL-QUANTITATIVE W GRAM STAIN
Culture: NO GROWTH
Gram Stain: NONE SEEN

## 2024-08-21 LAB — SURGICAL PATHOLOGY

## 2024-08-22 LAB — AEROBIC/ANAEROBIC CULTURE W GRAM STAIN (SURGICAL/DEEP WOUND)
Culture: NO GROWTH
Gram Stain: NONE SEEN

## 2024-08-23 LAB — CYTOLOGY - NON PAP

## 2024-08-24 ENCOUNTER — Telehealth: Payer: Self-pay | Admitting: Pulmonary Disease

## 2024-08-24 DIAGNOSIS — C3492 Malignant neoplasm of unspecified part of left bronchus or lung: Secondary | ICD-10-CM

## 2024-08-24 DIAGNOSIS — C3490 Malignant neoplasm of unspecified part of unspecified bronchus or lung: Secondary | ICD-10-CM

## 2024-08-24 LAB — CYTOLOGY - NON PAP

## 2024-08-24 NOTE — Telephone Encounter (Signed)
 Called Jamie Haas and informed her that biopsy for mother was lung cancer. I informed her that she would need MRI Brain to complete non-invasive staging and to see medical and radiation oncology. She's in agreement.

## 2024-08-27 NOTE — Telephone Encounter (Signed)
 New referrals for pt. She lives in a assisted living place so her daughter is best contact

## 2024-08-27 NOTE — Progress Notes (Signed)
 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 female seen in consultation for radiation therapy.  A lung nodule was initially identified in April of this year on presentation to the ER for AMS/abdominal pain. She underwent a CT Abdomen Pelvis WO which demonstrated a linugular nodule favored to be infectious. Fu CT Chest recommended in 4-6 weeks. She was seen in the ER again in July of this year for AMS. Exam demonstrated abdominal pain and so a repeat CT Abdomen/Pelvis was performed demonstrating an increased size of the lingular nodule, suspicious for a primary lung malignancy.   She was referred to pulmonology for further workup and management. They recommended bronchoscopy w/ biopsy as well as a Super D CT and PET/CT. Imaging was completed on 07/16/24 and demonstrated a persistent lingular nodule suggestive of a primary bronchogenic carcinoma as well as a L supraclavicular nodal metastasis. PET/CT found both the lingular nodule and the supraclavicular adenopathy to be hypermetabolic. No other metastatic disease identified.   IR guided biopsy of the L supraclavicular node was completed on 07/27/24 and returned as benign skeletal muscle without any lymph node tissue identified. Bronchoscopy performed on 10/10 revealed malignant cells present in the lingular FNA, consistent with a squamous cell carcinoma.  MRI Brain is scheduled for Friday.  PREVIOUS RADIATION THERAPY: {EXAM; YES/NO:19492::No}  AUTOIMMUNE DISEASE: {EXAM; YES/NO:19492::No}  MEDICAL DEVICES: {EXAM; YES/NO:19492::No}  PREGNANCY: {EXAM; YES/NO:19492::No}   PAST  MEDICAL HISTORY:  Past Medical History:  Diagnosis Date   Aneurysm    left side of brain. Coiled back in 2011   Arthritis    COPD (chronic obstructive pulmonary disease) (HCC) 02/07/2024   Dementia (HCC)    Depression    Diabetes mellitus without complication (HCC) 07/04/2019   Patient denies diabetes   Hemiplegia (HCC)    L side   Hypertension    Stroke (HCC) 11/08/2009   left sided weakness       PAST SURGICAL HISTORY: Past Surgical History:  Procedure Laterality Date   BRAIN SURGERY  2011   coils inserted   ENDOBRONCHIAL ULTRASOUND Bilateral 08/17/2024   Procedure: ENDOBRONCHIAL ULTRASOUND (EBUS);  Surgeon: Catherine Cools, MD;  Location: [facility] ENDOSCOPY;  Service: Pulmonary;  Laterality: Bilateral;   HARDWARE REMOVAL Left 04/29/2016   Procedure: HARDWARE REMOVAL LEFT ANKLE;  Surgeon: Kay CHRISTELLA Cummins, MD;  Location: [facility] OR;  Service: Orthopedics;  Laterality: Left;   IR US  LIVER BIOPSY  07/27/2024   ORIF ANKLE FRACTURE Left 12/24/2015   Procedure: OPEN REDUCTION INTERNAL FIXATION (ORIF) ANKLE FRACTURE;  Surgeon: Kay CHRISTELLA Cummins, MD;  Location: [facility] OR;  Service: Orthopedics;  Laterality: Left;   TOTAL KNEE ARTHROPLASTY     TOTAL KNEE ARTHROPLASTY Right 04/29/2016   Procedure: RIGHT TOTAL KNEE ARTHROPLASTY;  Surgeon: Kay CHRISTELLA Cummins, MD;  Location: [facility] OR;  Service: Orthopedics;  Laterality: Right;   VIDEO BRONCHOSCOPY WITH ENDOBRONCHIAL NAVIGATION Left 08/17/2024   Procedure: VIDEO BRONCHOSCOPY WITH ENDOBRONCHIAL NAVIGATION;  Surgeon: Catherine Cools, MD;  Location: [facility] ENDOSCOPY;  Service: Pulmonary;  Laterality: Left;   VULVAR LESION REMOVAL  03/17/2012   Procedure: VULVAR LESION;  Surgeon:  Carlin DELENA Centers, MD;  Location: [facility] ORS;  Service: Gynecology;  Laterality: N/A;  CO2 Laser Vaporization Of Condyloma     FAMILY HISTORY:  Family History  Problem Relation Age of Onset   Hypertension Mother    Osteoarthritis Mother    Cancer Mother      SOCIAL HISTORY:  reports that she has quit  smoking. Her smoking use included cigarettes. She has a 7.5 pack-year smoking history. She has never used smokeless tobacco. She reports that she does not drink alcohol and does not use drugs.   ALLERGIES: Patient has no known allergies.   MEDICATIONS:  Current Outpatient Medications  Medication Sig Dispense Refill   albuterol  (VENTOLIN  HFA) 108 (90 Base) MCG/ACT inhaler Inhale 2 puffs into the lungs every 6 (six) hours as needed for wheezing or shortness of breath.     aspirin  EC 81 MG tablet Take 81 mg by mouth daily.     atorvastatin  (LIPITOR) 10 MG tablet Take 10 mg by mouth daily.     cholecalciferol (VITAMIN D) 1000 units tablet Take 1,000 Units by mouth daily.     diclofenac  sodium (VOLTAREN ) 1 % GEL Apply 2 g topically 4 (four) times daily. 100 g 0   Emollient (EUCERIN DAILY HYDRATION) CREA Apply 1 application  topically in the morning and at bedtime.     ferrous sulfate  324 MG TBEC Take 324 mg by mouth daily.     gabapentin  (NEURONTIN ) 100 MG capsule Take 100 mg by mouth daily.     melatonin 3 MG TABS tablet Take 6 mg by mouth at bedtime.     methimazole  (TAPAZOLE ) 5 MG tablet Take 5 mg by mouth daily.     mirtazapine  (REMERON ) 7.5 MG tablet Take 7.5 mg by mouth at bedtime.     sertraline  (ZOLOFT ) 50 MG tablet Take 50 mg by mouth daily.     Tiotropium Bromide  Monohydrate (SPIRIVA  RESPIMAT) 2.5 MCG/ACT AERS Inhale 2 puffs into the lungs daily.     valACYclovir  (VALTREX ) 500 MG tablet Take 500 mg by mouth daily.     Venlafaxine  HCl 75 MG TB24 Take 1 tablet by mouth daily.     Zinc Oxide 10 % OINT Apply 1 application  topically in the morning and at bedtime. Apply to buttocks and groin every day and night shift. May also be applied as needed for skin irritation.     No current facility-administered medications for this encounter.     REVIEW OF SYSTEMS: The patient reports that s***/he is doing well overall and a review of symptoms is otherwise negative.      PHYSICAL EXAM:   Wt Readings from Last 3 Encounters:  08/17/24 205 lb 0.4 oz (93 kg)  07/24/24 205 lb (93 kg)  03/06/24 211 lb (95.7 kg)   Temp Readings from Last 3 Encounters:  08/17/24 97.7 F (36.5 C)  05/26/24 98 F (36.7 C) (Oral)  03/08/24 98 F (36.7 C)   BP Readings from Last 3 Encounters:  08/17/24 110/61  07/24/24 132/80  07/23/24 121/77   Pulse Readings from Last 3 Encounters:  08/17/24 (!) 109  07/24/24 97  07/23/24 86    /10   Physical Exam   ECOG = ***   LABORATORY DATA:  Lab Results  Component Value Date   WBC 9.1 08/17/2024   HGB 12.1 08/17/2024   HCT 38.6 08/17/2024   MCV 96.3 08/17/2024   PLT 310 08/17/2024   Lab Results  Component Value Date   NA  143 08/17/2024   K 4.4 08/17/2024   CL 103 08/17/2024   CO2 25 08/17/2024   Lab Results  Component Value Date   ALT 11 05/25/2024   AST 16 05/25/2024   ALKPHOS 73 05/25/2024   BILITOT 0.7 05/25/2024      RADIOGRAPHY:   NM PET Skull Base to Thigh 07/16/24  FINDINGS: Mediastinal blood pool activity: SUV max 1.9   Liver activity: SUV max NA   NECK:   Misregistration artifact with asymmetric right oropharyngeal activity and no definite CT correlate.   Incidental CT findings:   None.   CHEST:   Hypermetabolic lingular nodule measures approximately 2.1 x 2.4 cm, SUV max 14.0. Hypermetabolic left supraclavicular nodule measures 2.5 x 2.7 cm (4/30), SUV max 6.5.   Incidental CT findings:   CT chest same day dictated separately.   ABDOMEN/PELVIS:   No abnormal hypermetabolism.  Scattered muscular uptake.   Incidental CT findings: Gallstones. Low-attenuation lesion in the left kidney. No specific follow-up necessary. Atherosclerotic calcification of the aorta.   SKELETON:   No abnormal hypermetabolism.  Fairly diffuse muscular uptake.   Incidental CT findings:   Osteopenia.  Degenerative changes in the spine.   IMPRESSION: 1. Hypermetabolic lingular nodule with hypermetabolic  left supraclavicular adenopathy, compatible with T1c N3 M0 or stage III B primary bronchogenic carcinoma. 2. Cholelithiasis. 3.  Aortic atherosclerosis (ICD10-I70.0).    NM PET Super D CT 07/16/24:  FINDINGS: Cardiovascular: Atherosclerotic calcification of the aorta and coronary arteries. Heart is enlarged. No pericardial effusion.   Mediastinum/Nodes: No pathologically enlarged mediastinal or axillary lymph nodes. Hilar regions are difficult to definitively evaluate without IV contrast. Left supraclavicular mass measures 2.4 x 2.6 cm (2/12). Esophagus is grossly unremarkable.   Lungs/Pleura: Centrilobular and paraseptal emphysema. Mild bibasilar dependent atelectasis. Persistent nodular consolidation, roughly measuring 2.2 x 2.4 cm, with increasing surrounding atelectasis. No pleural fluid. Debris in the airway.   Upper Abdomen: Gallstone. Low-attenuation lesion in the upper pole left kidney. No specific follow-up necessary. Visualized portions of the liver, gallbladder, adrenal glands, kidneys, spleen, pancreas, stomach and bowel are otherwise grossly unremarkable. No upper abdominal adenopathy.   Musculoskeletal: Osteopenia. Degenerative changes in the spine. Upper/midthoracic kyphosis. Old manubrial fracture.   IMPRESSION: 1. Persistent lingular nodule, indicative of primary bronchogenic carcinoma. Left supraclavicular nodal metastasis. PET dictated separately. 2. Cholelithiasis. 3. Aortic atherosclerosis (ICD10-I70.0). Coronary artery calcification. 4.  Emphysema (ICD10-J43.9).    CT Abdomen Pelvis W 05/25/24:  FINDINGS: Lower chest: Increased size of lingular nodule measuring 2.5 x 2.0 cm (5:6), previously 1.9 x 1.4 cm. Interval resolution of bilateral lower lobe consolidations. No pleural effusion or pneumothorax demonstrated. Partially imaged heart size is normal. Coronary artery calcifications. Mitral annular calcifications.   Hepatobiliary: No focal  hepatic lesions. No intra or extrahepatic biliary ductal dilation. Cholelithiasis. Calcification of the gallbladder fundus may represent mural calcification or an additional calcified stone.   Pancreas: No focal lesions or main ductal dilation.   Spleen: Normal in size without focal abnormality.   Adrenals/Urinary Tract: No adrenal nodules. No suspicious renal mass, calculi or hydronephrosis. Left upper pole simple cyst. No specific follow-up imaging recommended. No focal bladder wall thickening.   Stomach/Bowel: Normal appearance of the stomach. No evidence of bowel wall thickening, distention, or inflammatory changes. Colonic diverticulosis without acute diverticulitis. Normal appendix.   Vascular/Lymphatic: Aortic atherosclerosis. No enlarged abdominal or pelvic lymph nodes.   Reproductive: No adnexal masses.   Other: No free fluid, fluid collection, or free air.   Musculoskeletal:  No acute or abnormal lytic or blastic osseous lesions. Multilevel degenerative changes of the partially imaged thoracic and lumbar spine. Similar L3 compression deformity and grade 1 anterolisthesis at L5-S1. Small focus of subcutaneous soft tissue stranding within the left anterior lower abdominal wall (3:70), likely injection related.   IMPRESSION: 1. No acute abdominopelvic findings. 2. Increased size of lingular nodule, suspicious for primary lung malignancy. 3. Interval resolution of bilateral lower lobe pneumonia. 4. Cholelithiasis. 5. Colonic diverticulosis without acute diverticulitis. 6.  Aortic Atherosclerosis (ICD10-I70.0).    CT Abdomen Pelvis WO 03/06/24:  FINDINGS:   LOWER CHEST: Dominant 19 mm irregular lingular nodule (image 9) with additional peribronchovascular nodularity in the posterior right lower lobe (image 1) and mild patchy dependent opacities. This appearance favors multifocal pneumonia possibly with superimposed mild atelectasis. Trace bilateral pleural  effusions.   HEPATOBILIARY: Gallbladder wall calcifications.   SPLEEN: No acute abnormality.   PANCREAS: No acute abnormality.   ADRENAL GLANDS: No acute abnormality.   KIDNEYS, URETERS AND BLADDER: No stones in the kidneys or ureters. No evidence of hydronephrosis. No evidence of perinephric or periureteral stranding. Urinary bladder is unremarkable.   GI AND BOWEL: Stomach demonstrates no acute abnormality. There is no evidence of bowel obstruction. No evidence of appendicitis. Normal appendix (image 49).   PERITONEUM AND RETROPERITONEUM: No evidence of ascites. No free air. Aorta is normal in caliber.   LYMPH NODES: No evidence of lymphadenopathy.   REPRODUCTIVE ORGANS: No acute abnormality.   BONES AND SOFT TISSUES: Mild superior endplate deformity with schmorl's node at L3. Grade 1 anterolisthesis of L5 on S1. No focal soft tissue abnormality.   LIMITATIONS/ARTIFACTS: Motion degraded images.   IMPRESSION: 1. Multifocal pneumonia with superimposed mild atelectasis. Trace bilateral pleural effusions. 2. Dominant lingular nodule is favored to be infectious but warrants attention on follow-up. Consider follow-up CT chest in 4-6 weeks. 3. No CT findings to account for the patient's abdominal pain.     PATHOLOGY:    Bronchoscopy with Biopsy 08/17/24:  CYTOLOGY - NON PAP  CASE: [id number]  PATIENT: Lashala Raggio  Non-Gynecological Cytology Report   Clinical History: LUL nodule    FINAL MICROSCOPIC DIAGNOSIS:   A. LUNG, LINGULAR, FINE NEEDLE ASPIRATION:  - Malignant cells present, see comment   B. LUNG, LINGULAR, BRUSHING:  - No malignant cells identified   COMMENT:   A. Immunohistochemical stains show that the tumor cells are positive  for p40 and CK5/6 while they are mostly negative for TTF-1.  This  immunoprofile is consistent with squamous cell carcinoma.  Dr. Frutoso  reviewed the case and concurs with the diagnosis    Supraclavicular  LN Biopsy 07/27/24:  SURGICAL PATHOLOGY  CASE: [id number]  PATIENT: Tynisa Tatum  Surgical Pathology Report   Clinical History: L supraclavicular mass, H/O L lung mass (las)   FINAL MICROSCOPIC DIAGNOSIS:   A. LYMPH NODE, LEFT SUPRACLAVICULAR, NEEDLE CORE BIOPSY:  - Benign unremarkable skeletal muscle and scant fibroconnective tissue  - Lymph node tissue is not identified  - No evidence of malignancy in the submitted tissue    IMPRESSION/PLAN:  Ms. 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  We reviewed the patient's diagnosis of locally advanced lung cancer and discussed standard treatment approaches. Specifically, we explained that the standard of care in this setting involves concurrent chemotherapy and radiation therapy. Alternative treatment modalities for earlier and more advanced stages of disease were briefly reviewed for context. The rationale  for concurrent therapy was emphasized, along with the standard treatment course of approximately 6 weeks of daily radiation therapy to a total dose of 60 Gy in 30 fractions, directed to the primary tumor and involved lymph nodes.   The first step in radiation planning will be a simulation CT scan. We explained that: This scan is used for treatment planning and will include imaging through the entire breathing cycle to account for tumor motion. The simulation appointment takes approximately 45 minutes. Radiation treatment planning requires about 1 week, during which the radiation oncology team will contour normal organs, define tumor and lymph node targets, and design a plan that is both safe and technically feasible.  We explained the logistics of radiation therapy in detail: -Treatment is delivered Monday through Friday, with no treatments on weekends or holidays. -Each daily visit lasts approximately 30-45 minutes, though actual treatment delivery takes only a few  minutes. -The patient will be seen by the radiation oncologist weekly during treatment to assess side effects and monitor treatment tolerance.   We reviewed potential acute and late toxicities, including but not limited to:  -Cough (irritation of the airways during or after treatment) -Shortness of breath (from lung inflammation or scarring) -Esophagitis (pain or difficulty with swallowing due to irritation of the esophagus) -Dermatitis (skin redness, dryness, or irritation in the treated area) -Chest wall discomfort (pain or tenderness over ribs or muscles in the treatment field) -Fatigue (generalized tiredness, often increasing as treatment progresses) -Radiation pneumonitis (inflammation of the lung tissue, which can occur weeks to months after treatment) -Potential injury to the heart (risk of inflammation, scarring, or long-term effects on cardiac function such as arrhythmias, heart failure, or coronary artery disease) -Potential injury to the spinal cord (rare but serious risk of weakness, numbness, or neurologic changes if the cord receives significant radiation exposure)   The patient is also under the care of medical oncology for systemic therapy. We will coordinate closely to ensure that chemotherapy and radiation are initiated concurrently. The treatment plan has been discussed with the multidisciplinary team. The patient has been provided with contact information for the care team and was encouraged to reach out with any questions or concerns.   -Simulation scheduled for *** with plan to start radiation the following week of *** -Fu as scheduled with medical oncology and for port placement*** -MRI Brain Friday, fu results -Referral to ***    Total time spent today in preparation for this visit was 60 minutes. This included patient care, imaging and path review, documentation, multidisciplinary discussion and coordination of care and follow up.    Estefana HERO. Maritza, M.D.

## 2024-08-27 NOTE — Progress Notes (Signed)
 Thoracic Location of Tumor / Histology: Squamous cell carcinoma of left lung   Xiamara L. Legate presented as referral from Dr. Paula Southerly [facility]).   Biopsy Location of tumor and Histology per Pathology Report:    08/17/2024 Dr. Paula Alghanim DG Chest Port 1 View CLINICAL DATA:  Status post bronchoscopy.   IMPRESSION: 1. No pneumothorax. 2. Interval patchy density in the left lower lung zone, most likely representing post biopsy lung hemorrhage. 3. Interval right mid lung zone atelectasis. 4. Stable mild-to-moderate chronic interstitial lung disease.  07/27/2024 Darrell K. Allred, PA-C DG Chest 1 View CLINICAL DATA:  [id number] Status post biopsy [id number]   IMPRESSION: 1. No acute cardiopulmonary process. Specifically, no acute post biopsy complication. 2. 3 cm lung mass silhouetting the superolateral cardiac apex, better appreciated and described on recent comparison CT chest and PET-CT.  Past/Anticipated interventions by pulmonary, if any: Dr. Paula Alghanim  Dr. Paula Alghanim informed her that she would need MRI Brain to complete non-invasive staging and to see medical and radiation oncology.   Past/Anticipated interventions by cardiothoracic surgery, if any:  Dr. Paula Alghanim VIDEO BRONCHOSCOPY WITH ENDOBRONCHIAL NAVIGATION (LEFT) ENDOBRONCHIAL ULTRASOUND (Bilateral)  Past/Anticipated interventions by medical oncology, if any: NA   Tobacco/Marijuana/Snuff/ETOH use: Former tobacco smoker, no drug or alcohol use.  Signs/Symptoms Weight changes, if any: No Respiratory complaints, if any:  No Hemoptysis, if any: No Pain issues, if any:  0/10 reports some pain last night.  SAFETY ISSUES: Prior radiation? No Pacemaker/ICD? No  Possible current pregnancy? Postmenopausal Is the patient on methotrexate? No  Current Complaints / other details:   None  30 minutes spent total, including time for meaningful use questions, reviewing  medication, as well as spent in face-to-face time in nurse evaluation with the patient.

## 2024-08-27 NOTE — Telephone Encounter (Signed)
 Spoke with daughter Jamie Haas regarding Jamie Haas MRI appointment scheduled for Friday, 10/24, at 11:30. Jamie Haas is aware. Also contacted [facility] assisted [facility] and spoke with transportation regarding arrangements for patient. Appointment reminder has been mailed.

## 2024-08-28 ENCOUNTER — Ambulatory Visit: Payer: Self-pay | Admitting: Pulmonary Disease

## 2024-08-28 NOTE — Progress Notes (Signed)
 NN spoke to the pt's dtr, Tabitha, to let her know we have the pts referral and we have space on 10/21 to see her. Pts dtr states that the facility she lives in provides transportation and isn't sure they would be able to get there on such short notice. NN states she will reach out to [facility] to see if transpo can be arranged.  NN called [facility] at 4:29. Told by front desk that transpo coordinator has gone home for the day.  NN called Tabitha back to let her know she couldn't reach transpo at [facility], but that there are appts available on 10/23 as well. Ginger agrees that an appt on 10/23 would be fine. NN states that she will call [facility] in the morning of 10/21 to set up a ride for the pt.

## 2024-08-28 NOTE — Progress Notes (Signed)
 NN called [facility]. Transpo coordinator not available at this time. NN left name and desk number and requested a call back to set up transportation for the pt.

## 2024-08-29 ENCOUNTER — Other Ambulatory Visit: Payer: Self-pay | Admitting: Internal Medicine

## 2024-08-29 ENCOUNTER — Ambulatory Visit
Admission: RE | Admit: 2024-08-29 | Discharge: 2024-08-29 | Disposition: A | Discharge status: Home / Self Care | Payer: Medicare (Managed Care) | Source: Ambulatory Visit | Attending: Radiation Oncology | Admitting: Radiation Oncology

## 2024-08-29 ENCOUNTER — Encounter: Payer: Self-pay | Admitting: Radiation Oncology

## 2024-08-29 VITALS — BP 145/83 | HR 85 | Temp 97.7°F | Resp 20 | Ht 65.0 in

## 2024-08-29 DIAGNOSIS — R911 Solitary pulmonary nodule: Secondary | ICD-10-CM

## 2024-08-29 DIAGNOSIS — M8580 Other specified disorders of bone density and structure, unspecified site: Secondary | ICD-10-CM | POA: Insufficient documentation

## 2024-08-29 DIAGNOSIS — E119 Type 2 diabetes mellitus without complications: Secondary | ICD-10-CM | POA: Diagnosis not present

## 2024-08-29 DIAGNOSIS — M1990 Unspecified osteoarthritis, unspecified site: Secondary | ICD-10-CM | POA: Insufficient documentation

## 2024-08-29 DIAGNOSIS — I10 Essential (primary) hypertension: Secondary | ICD-10-CM | POA: Insufficient documentation

## 2024-08-29 DIAGNOSIS — C770 Secondary and unspecified malignant neoplasm of lymph nodes of head, face and neck: Secondary | ICD-10-CM | POA: Insufficient documentation

## 2024-08-29 DIAGNOSIS — F0390 Unspecified dementia without behavioral disturbance: Secondary | ICD-10-CM | POA: Insufficient documentation

## 2024-08-29 DIAGNOSIS — J449 Chronic obstructive pulmonary disease, unspecified: Secondary | ICD-10-CM | POA: Insufficient documentation

## 2024-08-29 DIAGNOSIS — Z809 Family history of malignant neoplasm, unspecified: Secondary | ICD-10-CM | POA: Insufficient documentation

## 2024-08-29 DIAGNOSIS — I700 Atherosclerosis of aorta: Secondary | ICD-10-CM | POA: Insufficient documentation

## 2024-08-29 DIAGNOSIS — J90 Pleural effusion, not elsewhere classified: Secondary | ICD-10-CM | POA: Diagnosis not present

## 2024-08-29 DIAGNOSIS — R590 Localized enlarged lymph nodes: Secondary | ICD-10-CM | POA: Insufficient documentation

## 2024-08-29 DIAGNOSIS — Z8673 Personal history of transient ischemic attack (TIA), and cerebral infarction without residual deficits: Secondary | ICD-10-CM | POA: Insufficient documentation

## 2024-08-29 DIAGNOSIS — Z79624 Long term (current) use of inhibitors of nucleotide synthesis: Secondary | ICD-10-CM | POA: Insufficient documentation

## 2024-08-29 DIAGNOSIS — J439 Emphysema, unspecified: Secondary | ICD-10-CM | POA: Diagnosis not present

## 2024-08-29 DIAGNOSIS — J189 Pneumonia, unspecified organism: Secondary | ICD-10-CM | POA: Insufficient documentation

## 2024-08-29 DIAGNOSIS — C3412 Malignant neoplasm of upper lobe, left bronchus or lung: Secondary | ICD-10-CM | POA: Insufficient documentation

## 2024-08-29 DIAGNOSIS — Z791 Long term (current) use of non-steroidal anti-inflammatories (NSAID): Secondary | ICD-10-CM | POA: Insufficient documentation

## 2024-08-29 DIAGNOSIS — I2510 Atherosclerotic heart disease of native coronary artery without angina pectoris: Secondary | ICD-10-CM | POA: Diagnosis not present

## 2024-08-29 DIAGNOSIS — Z7982 Long term (current) use of aspirin: Secondary | ICD-10-CM | POA: Insufficient documentation

## 2024-08-29 DIAGNOSIS — Z87891 Personal history of nicotine dependence: Secondary | ICD-10-CM | POA: Insufficient documentation

## 2024-08-29 DIAGNOSIS — D649 Anemia, unspecified: Secondary | ICD-10-CM

## 2024-08-29 DIAGNOSIS — Z79899 Other long term (current) drug therapy: Secondary | ICD-10-CM | POA: Insufficient documentation

## 2024-08-29 DIAGNOSIS — K5730 Diverticulosis of large intestine without perforation or abscess without bleeding: Secondary | ICD-10-CM | POA: Insufficient documentation

## 2024-08-29 DIAGNOSIS — K8020 Calculus of gallbladder without cholecystitis without obstruction: Secondary | ICD-10-CM | POA: Insufficient documentation

## 2024-08-29 DIAGNOSIS — G8190 Hemiplegia, unspecified affecting unspecified side: Secondary | ICD-10-CM | POA: Insufficient documentation

## 2024-08-29 DIAGNOSIS — M129 Arthropathy, unspecified: Secondary | ICD-10-CM | POA: Insufficient documentation

## 2024-08-29 DIAGNOSIS — Z923 Personal history of irradiation: Secondary | ICD-10-CM | POA: Insufficient documentation

## 2024-08-29 NOTE — Telephone Encounter (Signed)
 Called [facility] again to speak with transportation, she was not available. Left my number once more for a return call regarding the patient's MRI appointment scheduled for Friday 10/24.

## 2024-08-29 NOTE — Telephone Encounter (Addendum)
 Spoke with Jon from [facility], they are aware of the patient's appt. Also called daughter,Tabitha, to update her on the appt.reminder has been mailed. Nothing further needed at this time.

## 2024-08-29 NOTE — Addendum Note (Signed)
 Encounter addended by: Claudene Odella SAILOR, RN on: 08/29/2024 10:14 AM  Actions taken: Visit diagnoses modified

## 2024-08-30 ENCOUNTER — Inpatient Hospital Stay: Payer: Medicare (Managed Care)

## 2024-08-30 ENCOUNTER — Inpatient Hospital Stay: Payer: Medicare (Managed Care) | Admitting: Internal Medicine

## 2024-08-30 ENCOUNTER — Telehealth: Payer: Self-pay | Admitting: Pulmonary Disease

## 2024-08-30 ENCOUNTER — Other Ambulatory Visit: Payer: Self-pay

## 2024-08-30 NOTE — Telephone Encounter (Signed)
 Jamie Haas did tell me to send clinical to [employer]

## 2024-08-30 NOTE — Telephone Encounter (Signed)
 joan [employer] have a urgent prior authorization that shakima was supposed to  fax some extra information but have not received this its for a urgent prior authorization Can't receive it through email have to receive through fax  Fax number [phone]

## 2024-08-30 NOTE — Telephone Encounter (Signed)
 Ms Jamie Haas is calling back cause she has not received the extra information that was suppose to be faxed by ms [facility]

## 2024-08-30 NOTE — Progress Notes (Signed)
 NN reached out to [facility] at 2:10pm. Spoke to receptionist, Mia. NN asks Mia if the pt was okay as she was supposed to be at appts at the cancer center today at 1:45. Mia states she was unsure and put NN on hold.  NN then called pts dtr, Tabitha, who was at [facility] at the time. Ginger is upset that she was not notified by [facility] staff that the pt had refused to go to her appts and cancelled her transportation. NN informed Tabitha that no one from [facility] had reached out to the cancer center to cancel the pts appts. Tabitha requests that pts appt be rescheduled and she be called tomorrow after 2:30.

## 2024-08-30 NOTE — Telephone Encounter (Signed)
 Copied from CRM [id number]. Topic: Clinical - Medication Prior Auth >> Aug 29, 2024  4:52 PM Joesph PARAS wrote: Reason for CRM: Imaging authorization for MRI of the Brain. States needs MRI of brain order and clinical notes to justify the purpose and need to the MRI faxed to [phone].   C/B - [phone] [facility] Nurse with [facility]

## 2024-08-31 ENCOUNTER — Ambulatory Visit (HOSPITAL_BASED_OUTPATIENT_CLINIC_OR_DEPARTMENT_OTHER): Payer: Medicare (Managed Care)

## 2024-09-04 ENCOUNTER — Encounter (HOSPITAL_COMMUNITY): Payer: Self-pay

## 2024-09-04 ENCOUNTER — Ambulatory Visit (HOSPITAL_COMMUNITY)
Admission: RE | Admit: 2024-09-04 | Discharge: 2024-09-04 | Disposition: A | Discharge status: Home / Self Care | Payer: Medicare (Managed Care) | Source: Ambulatory Visit | Attending: Pulmonary Disease | Admitting: Pulmonary Disease

## 2024-09-04 DIAGNOSIS — C3490 Malignant neoplasm of unspecified part of unspecified bronchus or lung: Secondary | ICD-10-CM

## 2024-09-04 DIAGNOSIS — C3492 Malignant neoplasm of unspecified part of left bronchus or lung: Secondary | ICD-10-CM

## 2024-09-04 NOTE — Progress Notes (Unsigned)
 [facility] Telephone:([phone]) [phone]   Fax:([phone]) [phone]  CONSULT NOTE  REFERRING PHYSICIAN: Dr. Paula  REASON FOR CONSULTATION:  Non-Small Cell Lung Cancer, Squamous Cell carcinoma.   HPI Jamie Haas is a 78 y.o. female has medical history significant for hypertension, CVA, pneumonia, Graves' disease, encephalopathy, osteoarthritis, knee replacement, depression, and ***is diagnosed with non-small cell lung cancer, squamous cell carcinoma.  Her workup began in April 2025 where a lung nodule was incidentally found on a CT of the abdomen pelvis that was performed in the ER for altered mental status and abdominal pain.  A follow-up CT scan in 4 to 6 weeks was recommended.  She was then seen in the ER again in July 2025 for altered mental status.  This showed increased size of the lingular nodule which was suspicious for lung malignancy.  She then saw pulmonary medicine who performed bronchoscopy and biopsy as well as super D CT scan and PET/CT.  The PET scan noted hypermetabolic lingular nodule.  Her PET scan initially noted hypermetabolic left supraclavicular adenopathy.  However her case was discussed at the multidisciplinary conference and her pathology of the left supraclavicular region is felt to be muscular in origin rather than a lymph node.  This is felt to be possible only a muscle tumor/growth and an MRI of the shoulder was recommended.  The FNA of the left lingular nodule ([id number]) was positive for non-small cell lung cancer, squamous cell carcinoma.  ***I don't see brain MRI  The patient met with radiation oncology on 08/29/24.  She is scheduled for CT simulation later today. She is expected to start radiation on 09/13/24.   She is currently in a SNF. ***Make sure she has   She denies any fever, chills, night sweats, or unexplained weight loss.  She denies any chest pain, shortness of breath, cough, or hemoptysis.  Denies any nausea, vomiting,  diarrhea, or constipation.  Denies any headache or visual changes. 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   Arthritis    COPD (chronic obstructive pulmonary disease) (HCC) 02/07/2024   Dementia (HCC)    Depression    Diabetes mellitus without complication (HCC) 07/04/2019   Patient denies diabetes   Hemiplegia (HCC)    L side   Hypertension    Stroke (HCC) 11/08/2009   left sided weakness    Past Surgical History:  Procedure Laterality Date   BRAIN SURGERY  2011   coils inserted   ENDOBRONCHIAL ULTRASOUND Bilateral 08/17/2024   Procedure: ENDOBRONCHIAL ULTRASOUND (EBUS);  Surgeon: Catherine Paula, MD;  Location: [facility] END[facility]COPY;  Service: Pulmonary;  Laterality: Bilateral;   HARDWARE REMOVAL Left 04/29/2016   Procedure: HARDWARE REMOVAL LEFT ANKLE;  Surgeon: Kay CHRISTELLA Cummins, MD;  Location: [facility] OR;  Service: Orthopedics;  Laterality: Left;   IR US  LIVER BIOPSY  07/27/2024   ORIF ANKLE FRACTURE Left 12/24/2015   Procedure: OPEN REDUCTION INTERNAL FIXATION (ORIF) ANKLE FRACTURE;  Surgeon: Kay CHRISTELLA Cummins, MD;  Location: [facility] OR;  Service: Orthopedics;  Laterality: Left;   TOTAL KNEE ARTHROPLASTY     TOTAL KNEE ARTHROPLASTY Right 04/29/2016   Procedure: RIGHT TOTAL KNEE ARTHROPLASTY;  Surgeon: Kay CHRISTELLA Cummins, MD;  Location: [facility] OR;  Service: Orthopedics;  Laterality: Right;   VIDEO BRONCHOSCOPY WITH ENDOBRONCHIAL NAVIGATION Left 08/17/2024   Procedure: VIDEO BRONCHOSCOPY WITH ENDOBRONCHIAL NAVIGATION;  Surgeon: Catherine Paula, MD;  Location: [facility] ENDOSCOPY;  Service: Pulmonary;  Laterality: Left;   VULVAR LESION REMOVAL  03/17/2012   Procedure: VULVAR LESION;  Surgeon: Carlin DELENA Centers, MD;  Location: [facility] ORS;  Service: Gynecology;  Laterality: N/A;  CO2 Laser Vaporization Of Condyloma    Family History  Problem Relation Age of Onset   Hypertension Mother    Osteoarthritis Mother    Lung cancer Mother     Social History Social History    Tobacco Use   Smoking status: Former    Current packs/day: 0.25    Average packs/day: 0.3 packs/day for 30.0 years (7.5 ttl pk-yrs)    Types: Cigarettes   Smokeless tobacco: Never  Substance Use Topics   Alcohol use: No   Drug use: No    No Known Allergies  Current Outpatient Medications  Medication Sig Dispense Refill   albuterol  (VENTOLIN  HFA) 108 (90 Base) MCG/ACT inhaler Inhale 2 puffs into the lungs every 6 (six) hours as needed for wheezing or shortness of breath.     aspirin  EC 81 MG tablet Take 81 mg by mouth daily.     atorvastatin  (LIPITOR) 10 MG tablet Take 10 mg by mouth daily.     cholecalciferol (VITAMIN D) 1000 units tablet Take 1,000 Units by mouth daily.     diclofenac  sodium (VOLTAREN ) 1 % GEL Apply 2 g topically 4 (four) times daily. 100 g 0   Emollient (EUCERIN DAILY HYDRATION) CREA Apply 1 application  topically in the morning and at bedtime.     ferrous sulfate  324 MG TBEC Take 324 mg by mouth daily.     gabapentin  (NEURONTIN ) 100 MG capsule Take 100 mg by mouth daily.     melatonin 3 MG TABS tablet Take 6 mg by mouth at bedtime.     methimazole  (TAPAZOLE ) 5 MG tablet Take 5 mg by mouth daily.     mirtazapine  (REMERON ) 7.5 MG tablet Take 7.5 mg by mouth at bedtime.     sertraline  (ZOLOFT ) 50 MG tablet Take 50 mg by mouth daily.     Tiotropium Bromide  Monohydrate (SPIRIVA  RESPIMAT) 2.5 MCG/ACT AERS Inhale 2 puffs into the lungs daily.     valACYclovir  (VALTREX ) 500 MG tablet Take 500 mg by mouth daily.     venlafaxine  XR (EFFEXOR -XR) 75 MG 24 hr capsule Take 75 mg by mouth daily.     Zinc Oxide 10 % OINT Apply 1 application  topically in the morning and at bedtime. Apply to buttocks and groin every day and night shift. May also be applied as needed for skin irritation.     No current facility-administered medications for this visit.    REVIEW OF SYSTEMS:   Review of Systems  Constitutional: Negative for appetite change, chills, fatigue, fever and  unexpected weight change.  HENT:   Negative for mouth sores, nosebleeds, sore throat and trouble swallowing.   Eyes: Negative for eye problems and icterus.  Respiratory: Negative for cough, hemoptysis, shortness of breath and wheezing.   Cardiovascular: Negative for chest pain and leg swelling.  Gastrointestinal: Negative for abdominal pain, constipation, diarrhea, nausea and vomiting.  Genitourinary: Negative for bladder incontinence, difficulty urinating, dysuria, frequency and hematuria.   Musculoskeletal: Negative for back pain, gait problem, neck pain and neck stiffness.  Skin: Negative for itching and rash.  Neurological: Negative for dizziness, extremity weakness, gait problem, headaches, light-headedness and seizures.  Hematological: Negative for adenopathy. Does not bruise/bleed easily.  Psychiatric/Behavioral: Negative for confusion, depression and sleep disturbance. The patient is not nervous/anxious.     PHYSICAL EXAMINATION:  There were no vitals taken for this visit.  ECOG PERFORMANCE STATUS: {CHL ONC ECOG [id number]  Physical Exam  Constitutional: Oriented to person, place, and time and well-developed, well-nourished, and in no distress. No distress.  HENT:  Head: Normocephalic and atraumatic.  Mouth/Throat: Oropharynx is clear and moist. No oropharyngeal exudate.  Eyes: Conjunctivae are normal. Right eye exhibits no discharge. Left eye exhibits no discharge. No scleral icterus.  Neck: Normal range of motion. Neck supple.  Cardiovascular: Normal rate, regular rhythm, normal heart sounds and intact distal pulses.   Pulmonary/Chest: Effort normal and breath sounds normal. No respiratory distress. No wheezes. No rales.  Abdominal: Soft. Bowel sounds are normal. Exhibits no distension and no mass. There is no tenderness.  Musculoskeletal: Normal range of motion. Exhibits no edema.  Lymphadenopathy:    No cervical adenopathy.  Neurological: Alert and oriented to person,  place, and time. Exhibits normal muscle tone. Gait normal. Coordination normal.  Skin: Skin is warm and dry. No rash noted. Not diaphoretic. No erythema. No pallor.  Psychiatric: Mood, memory and judgment normal.  Vitals reviewed.  LABORATORY DATA: Lab Results  Component Value Date   WBC 9.1 08/17/2024   HGB 12.1 08/17/2024   HCT 38.6 08/17/2024   MCV 96.3 08/17/2024   PLT 310 08/17/2024      Chemistry      Component Value Date/Time   NA 143 08/17/2024 0640   NA 141 09/09/2016 0000   K 4.4 08/17/2024 0640   CL 103 08/17/2024 0640   CO2 25 08/17/2024 0640   BUN 12 08/17/2024 0640   BUN 17 09/09/2016 0000   CREATININE 0.90 08/17/2024 0640   GLU 77 09/09/2016 0000      Component Value Date/Time   CALCIUM  9.3 08/17/2024 0640   ALKPHOS 73 05/25/2024 1637   AST 16 05/25/2024 1637   ALT 11 05/25/2024 1637   BILITOT 0.7 05/25/2024 1637       RADIOGRAPHIC STUDIES: DG Chest Port 1 View Result Date: 08/17/2024 CLINICAL DATA:  Status post bronchoscopy. EXAM: PORTABLE CHEST 1 VIEW COMPARISON:  07/27/2024, Super D chest CT and PET-CT dated 07/16/2024. FINDINGS: Very poor inspiration. Interval patchy density in the left lower lung zone and linear and ill-defined density in the right mid lung zone. No pneumothorax. Stable mild-to-moderate chronic interstitial prominence. Grossly normal-sized heart. Unremarkable bones. IMPRESSION: 1. No pneumothorax. 2. Interval patchy density in the left lower lung zone, most likely representing post biopsy lung hemorrhage. 3. Interval right mid lung zone atelectasis. 4. Stable mild-to-moderate chronic interstitial lung disease. Electronically Signed   By: Elspeth Bathe M.D.   On: 08/17/2024 10:09   DG C-ARM BRONCHOSCOPY Result Date: 08/17/2024 C-ARM BRONCHOSCOPY: Fluoroscopy was utilized by the requesting physician.  No radiographic interpretation.   DG C-Arm 1-60 Min-No Report Result Date: 08/17/2024 Fluoroscopy was utilized by the requesting  physician.  No radiographic interpretation.    ASSESSMENT: This is a very pleasant 78 year old African-American female with stage ***small cell lung cancer, squamous cell carcinoma.  She presented with a left lingular nodule.  The patient is expected to undergo radiation to the left lingular nodule under the care of Dr. Maritza 09/13/2024.  We discussed the patient's case in the multidisciplinary conference.  It is felt that the region in the left supraclavicular area is felt to be a muscle tumor/growth.  We will arrange for an MRI of the shoulder to further evaluate this.   Patient does not need any systemic therapy.  We will see the patient  back with a surveillance scan of the chest in ***4 months.  The patient was advised to call immediately if she has any concerning symptoms in the interval. The patient voices understanding of current disease status and treatment options and is in agreement with the current care plan. All questions were answered. The patient knows to call the clinic with any problems, questions or concerns. We can certainly see the patient much sooner if necessary   The patient voices understanding of current disease status and treatment options and is in agreement with the current care plan.  All questions were answered. The patient knows to call the clinic with any problems, questions or concerns. We can certainly see the patient much sooner if necessary.  Thank you so much for allowing me to participate in the care of Jamie Haas. I will continue to follow up the patient with you and assist in her care.  I spent {CHL ONC TIME VISIT - DTPQU:[id number]} counseling the patient face to face. The total time spent in the appointment was {CHL ONC TIME VISIT - DTPQU:[id number]}.  Disclaimer: This note was dictated with voice recognition software. Similar sounding words can inadvertently be transcribed and may not be corrected upon review.   Florian Chauca L  Clelia Trabucco September 04, 2024, 3:38 PM

## 2024-09-04 NOTE — Progress Notes (Signed)
 The proposed treatment discussed in conference is for discussion purpose only and is not a binding recommendation.  The patients have not been physically examined, or presented with their treatment options.  Therefore, final treatment plans cannot be decided.

## 2024-09-06 ENCOUNTER — Inpatient Hospital Stay: Payer: Medicare (Managed Care) | Attending: Physician Assistant | Admitting: Physician Assistant

## 2024-09-06 ENCOUNTER — Ambulatory Visit: Payer: Medicare (Managed Care) | Admitting: Radiation Oncology

## 2024-09-06 ENCOUNTER — Ambulatory Visit: Admission: RE | Admit: 2024-09-06 | Payer: Medicare (Managed Care) | Source: Ambulatory Visit

## 2024-09-06 ENCOUNTER — Inpatient Hospital Stay: Payer: Medicare (Managed Care)

## 2024-09-06 VITALS — BP 117/80 | HR 89 | Temp 98.0°F | Resp 17 | Ht 65.0 in

## 2024-09-06 DIAGNOSIS — C3412 Malignant neoplasm of upper lobe, left bronchus or lung: Secondary | ICD-10-CM | POA: Diagnosis not present

## 2024-09-08 NOTE — Progress Notes (Signed)
 Established Patient Pulmonology Office Visit   Subjective:  Patient ID: Jamie Haas, female    DOB: [birth year]  MRN: [id number]  CC: No chief complaint on file.   HPI Ms. Jamie Haas is a 78 y.o. 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  A lung nodule was initially identified in April of this year on presentation to the ER for AMS/abdominal pain. She underwent a CT Abdomen Pelvis WO which demonstrated a linugular nodule favored to be infectious. Fu CT Chest recommended in 4-6 weeks. She was seen in the ER again in July of this year for AMS. Exam demonstrated abdominal pain and so a repeat CT Abdomen/Pelvis was performed demonstrating an increased size of the lingular nodule, suspicious for a primary lung malignancy.    She was referred to pulmonology for further workup and management. They recommended bronchoscopy w/ biopsy as well as a Super D CT and PET/CT. Imaging was completed on 07/16/24 and demonstrated a persistent lingular nodule suggestive of a primary bronchogenic carcinoma as well as a L supraclavicular nodal metastasis. PET/CT found both the lingular nodule and the supraclavicular adenopathy to be hypermetabolic. No other metastatic disease identified.    IR guided biopsy of the L supraclavicular node was completed on 07/27/24 and returned as benign skeletal muscle without any lymph node tissue identified. Bronchoscopy performed on 10/10 revealed malignant cells present in the lingular FNA, consistent with a squamous cell carcinoma.  Brain MRI not completed due to clips in brain (?metal). Radiation oncology saw patient and are planning to start treatment with SBRT.  {PULM QUESTIONNAIRES (Optional):33196}  ROS  {History (Optional):23778}  Current Outpatient Medications:    albuterol  (VENTOLIN  HFA) 108 (90 Base) MCG/ACT  inhaler, Inhale 2 puffs into the lungs every 6 (six) hours as needed for wheezing or shortness of breath., Disp: , Rfl:    aspirin  EC 81 MG tablet, Take 81 mg by mouth daily., Disp: , Rfl:    atorvastatin  (LIPITOR) 10 MG tablet, Take 10 mg by mouth daily., Disp: , Rfl:    cholecalciferol (VITAMIN D) 1000 units tablet, Take 1,000 Units by mouth daily., Disp: , Rfl:    diclofenac  sodium (VOLTAREN ) 1 % GEL, Apply 2 g topically 4 (four) times daily., Disp: 100 g, Rfl: 0   Emollient (EUCERIN DAILY HYDRATION) CREA, Apply 1 application  topically in the morning and at bedtime., Disp: , Rfl:    ferrous sulfate  324 MG TBEC, Take 324 mg by mouth daily., Disp: , Rfl:    gabapentin  (NEURONTIN ) 100 MG capsule, Take 100 mg by mouth daily., Disp: , Rfl:    melatonin 3 MG TABS tablet, Take 6 mg by mouth at bedtime., Disp: , Rfl:    methimazole  (TAPAZOLE ) 5 MG tablet, Take 5 mg by mouth daily., Disp: , Rfl:    mirtazapine  (REMERON ) 7.5 MG tablet, Take 7.5 mg by mouth at bedtime., Disp: , Rfl:    sertraline  (ZOLOFT ) 50 MG tablet, Take 50 mg by mouth daily., Disp: , Rfl:    Tiotropium Bromide  Monohydrate (SPIRIVA  RESPIMAT) 2.5 MCG/ACT AERS, Inhale 2 puffs into the lungs daily., Disp: , Rfl:    valACYclovir  (VALTREX ) 500 MG tablet, Take 500 mg by mouth daily., Disp: , Rfl:    venlafaxine  XR (EFFEXOR -XR) 75 MG 24 hr capsule, Take 75 mg by mouth daily., Disp: , Rfl:    Zinc Oxide  10 % OINT, Apply 1 application  topically in the morning and at bedtime. Apply to buttocks and groin every day and night shift. May also be applied as needed for skin irritation., Disp: , Rfl:       Objective:  There were no vitals taken for this visit. {Pulm Vitals (Optional):32837}  Physical Exam   Diagnostic Review:  {Labs (Optional):32838}  RADIOGRAPHY:    NM PET Skull Base to Thigh 07/16/24   FINDINGS: Mediastinal blood pool activity: SUV max 1.9   Liver activity: SUV max NA   NECK:   Misregistration artifact with  asymmetric right oropharyngeal activity and no definite CT correlate.   Incidental CT findings:   None.   CHEST:   Hypermetabolic lingular nodule measures approximately 2.1 x 2.4 cm, SUV max 14.0. Hypermetabolic left supraclavicular nodule measures 2.5 x 2.7 cm (4/30), SUV max 6.5.   Incidental CT findings:   CT chest same day dictated separately.   ABDOMEN/PELVIS:   No abnormal hypermetabolism.  Scattered muscular uptake.   Incidental CT findings: Gallstones. Low-attenuation lesion in the left kidney. No specific follow-up necessary. Atherosclerotic calcification of the aorta.   SKELETON:   No abnormal hypermetabolism.  Fairly diffuse muscular uptake.   Incidental CT findings:   Osteopenia.  Degenerative changes in the spine.   IMPRESSION: 1. 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 2. Cholelithiasis. 3.  Aortic atherosclerosis (ICD10-I70.0).       NM PET Super D CT 07/16/24:   FINDINGS: Cardiovascular: Atherosclerotic calcification of the aorta and coronary arteries. Heart is enlarged. No pericardial effusion.   Mediastinum/Nodes: No pathologically enlarged mediastinal or axillary lymph nodes. Hilar regions are difficult to definitively evaluate without IV contrast. Left supraclavicular mass measures 2.4 x 2.6 cm (2/12). Esophagus is grossly unremarkable.   Lungs/Pleura: Centrilobular and paraseptal emphysema. Mild bibasilar dependent atelectasis. Persistent nodular consolidation, roughly measuring 2.2 x 2.4 cm, with increasing surrounding atelectasis. No pleural fluid. Debris in the airway.   Upper Abdomen: Gallstone. Low-attenuation lesion in the upper pole left kidney. No specific follow-up necessary. Visualized portions of the liver, gallbladder, adrenal glands, kidneys, spleen, pancreas, stomach and bowel are otherwise grossly  unremarkable. No upper abdominal adenopathy.   Musculoskeletal: Osteopenia. Degenerative changes in the spine. Upper/midthoracic kyphosis. Old manubrial fracture.   IMPRESSION: 1. Persistent lingular nodule, indicative of primary bronchogenic carcinoma. Left supraclavicular nodal metastasis. PET dictated separately. 2. Cholelithiasis. 3. Aortic atherosclerosis (ICD10-I70.0). Coronary artery calcification. 4.  Emphysema (ICD10-J43.9).       CT Abdomen Pelvis W 05/25/24:   FINDINGS: Lower chest: Increased size of lingular nodule measuring 2.5 x 2.0 cm (5:6), previously 1.9 x 1.4 cm. Interval resolution of bilateral lower lobe consolidations. No pleural effusion or pneumothorax demonstrated. Partially imaged heart size is normal. Coronary artery calcifications. Mitral annular calcifications.   Hepatobiliary: No focal hepatic lesions. No intra or extrahepatic biliary ductal dilation. Cholelithiasis. Calcification of the gallbladder fundus may represent mural calcification or an additional calcified stone.   Pancreas: No focal lesions or main ductal dilation.   Spleen: Normal in size without focal abnormality.   Adrenals/Urinary Tract: No adrenal nodules. No suspicious renal mass, calculi or hydronephrosis. Left upper pole simple cyst. No specific follow-up imaging recommended. No focal bladder wall thickening.   Stomach/Bowel: Normal appearance of the stomach. No evidence of bowel wall thickening, distention, or inflammatory changes. Colonic diverticulosis without acute diverticulitis. Normal appendix.   Vascular/Lymphatic: Aortic atherosclerosis. No  enlarged abdominal or pelvic lymph nodes.   Reproductive: No adnexal masses.   Other: No free fluid, fluid collection, or free air.   Musculoskeletal: No acute or abnormal lytic or blastic osseous lesions. Multilevel degenerative changes of the partially imaged thoracic and lumbar spine. Similar L3 compression deformity  and grade 1 anterolisthesis at L5-S1. Small focus of subcutaneous soft tissue stranding within the left anterior lower abdominal wall (3:70), likely injection related.   IMPRESSION: 1. No acute abdominopelvic findings. 2. Increased size of lingular nodule, suspicious for primary lung malignancy. 3. Interval resolution of bilateral lower lobe pneumonia. 4. Cholelithiasis. 5. Colonic diverticulosis without acute diverticulitis. 6.  Aortic Atherosclerosis (ICD10-I70.0).       CT Abdomen Pelvis WO 03/06/24:   FINDINGS:   LOWER CHEST: Dominant 19 mm irregular lingular nodule (image 9) with additional peribronchovascular nodularity in the posterior right lower lobe (image 1) and mild patchy dependent opacities. This appearance favors multifocal pneumonia possibly with superimposed mild atelectasis. Trace bilateral pleural effusions.   HEPATOBILIARY: Gallbladder wall calcifications.   SPLEEN: No acute abnormality.   PANCREAS: No acute abnormality.   ADRENAL GLANDS: No acute abnormality.   KIDNEYS, URETERS AND BLADDER: No stones in the kidneys or ureters. No evidence of hydronephrosis. No evidence of perinephric or periureteral stranding. Urinary bladder is unremarkable.   GI AND BOWEL: Stomach demonstrates no acute abnormality. There is no evidence of bowel obstruction. No evidence of appendicitis. Normal appendix (image 49).   PERITONEUM AND RETROPERITONEUM: No evidence of ascites. No free air. Aorta is normal in caliber.   LYMPH NODES: No evidence of lymphadenopathy.   REPRODUCTIVE ORGANS: No acute abnormality.   BONES AND SOFT TISSUES: Mild superior endplate deformity with schmorl's node at L3. Grade 1 anterolisthesis of L5 on S1. No focal soft tissue abnormality.   LIMITATIONS/ARTIFACTS: Motion degraded images.   IMPRESSION: 1. Multifocal pneumonia with superimposed mild atelectasis. Trace bilateral pleural effusions. 2. Dominant lingular nodule is  favored to be infectious but warrants attention on follow-up. Consider follow-up CT chest in 4-6 weeks. 3. No CT findings to account for the patient's abdominal pain.       PATHOLOGY:    Bronchoscopy with Biopsy 08/17/24:   CYTOLOGY - NON PAP  CASE: [id number]  PATIENT: Warren Harjo  Non-Gynecological Cytology Report   Clinical History: LUL nodule    FINAL MICROSCOPIC DIAGNOSIS:   A. LUNG, LINGULAR, FINE NEEDLE ASPIRATION:  - Malignant cells present, see comment   B. LUNG, LINGULAR, BRUSHING:  - No malignant cells identified   COMMENT:   A. Immunohistochemical stains show that the tumor cells are positive  for p40 and CK5/6 while they are mostly negative for TTF-1.  This  immunoprofile is consistent with squamous cell carcinoma.  Dr. Frutoso  reviewed the case and concurs with the diagnosis      Supraclavicular LN Biopsy 07/27/24:   SURGICAL PATHOLOGY  CASE: [id number]  PATIENT: Kazuko Mcever  Surgical Pathology Report   Clinical History: L supraclavicular mass, H/O L lung mass (las)   FINAL MICROSCOPIC DIAGNOSIS:   A. LYMPH NODE, LEFT SUPRACLAVICULAR, NEEDLE CORE BIOPSY:  - Benign unremarkable skeletal muscle and scant fibroconnective tissue  - Lymph node tissue is not identified  - No evidence of malignancy in the submitted tissu    Assessment & Plan:   Assessment & Plan   No orders of the defined types were placed in this encounter.     No follow-ups on file.   Alesi Zachery, MD

## 2024-09-10 ENCOUNTER — Ambulatory Visit (INDEPENDENT_AMBULATORY_CARE_PROVIDER_SITE_OTHER): Payer: Medicare (Managed Care) | Admitting: Pulmonary Disease

## 2024-09-10 ENCOUNTER — Encounter (HOSPITAL_BASED_OUTPATIENT_CLINIC_OR_DEPARTMENT_OTHER): Payer: Self-pay

## 2024-09-10 ENCOUNTER — Encounter (HOSPITAL_BASED_OUTPATIENT_CLINIC_OR_DEPARTMENT_OTHER): Payer: Self-pay | Admitting: Pulmonary Disease

## 2024-09-10 VITALS — BP 130/82 | HR 86 | Ht 65.0 in

## 2024-09-10 DIAGNOSIS — C3492 Malignant neoplasm of unspecified part of left bronchus or lung: Secondary | ICD-10-CM | POA: Diagnosis not present

## 2024-09-10 DIAGNOSIS — J449 Chronic obstructive pulmonary disease, unspecified: Secondary | ICD-10-CM | POA: Diagnosis not present

## 2024-09-10 NOTE — Patient Instructions (Addendum)
  VISIT SUMMARY: You came in for a pulmonary checkup after a lung nodule was found during a scan. The nodule has been confirmed as benign, and you are currently undergoing radiation therapy. You also have COPD and are on medication for it.  YOUR PLAN: LUNG MASS (LINGULAR): A lung nodule was found and confirmed to be lung cancer without spread to lymph nodes. -Continue with your scheduled radiation therapy. -We will repeat imaging in a few months to see how the treatment is working.  CHRONIC OBSTRUCTIVE PULMONARY DISEASE (COPD): You have COPD and are using inhalers to manage it. -Continue using your Spiriva  inhaler daily. -Use your albuterol  inhaler as needed.   Contains text generated by [employer].

## 2024-09-11 ENCOUNTER — Ambulatory Visit
Admission: RE | Admit: 2024-09-11 | Payer: Medicare (Managed Care) | Source: Ambulatory Visit | Admitting: Radiation Oncology

## 2024-09-17 ENCOUNTER — Ambulatory Visit
Admission: RE | Admit: 2024-09-17 | Discharge: 2024-09-17 | Disposition: A | Discharge status: Home / Self Care | Payer: Medicare (Managed Care) | Source: Ambulatory Visit | Attending: Radiation Oncology | Admitting: Radiation Oncology

## 2024-09-17 DIAGNOSIS — Z510 Encounter for antineoplastic radiation therapy: Secondary | ICD-10-CM | POA: Diagnosis present

## 2024-09-17 DIAGNOSIS — C3412 Malignant neoplasm of upper lobe, left bronchus or lung: Secondary | ICD-10-CM | POA: Diagnosis present

## 2024-09-18 LAB — FUNGUS CULTURE WITH STAIN

## 2024-09-18 LAB — FUNGAL ORGANISM REFLEX

## 2024-09-18 LAB — FUNGUS CULTURE RESULT

## 2024-09-24 NOTE — Progress Notes (Signed)
 NN returned pts dtr, Tabitha's phone call. Tabitha needed clarification of pts upcoming appts. NN informed Tabitha that the appt on 11/21 at 8am at [facility] is for a PET scan. NN reviewed instructions (no food after 2am, sips of plain water only with any daily medication as long as they don't need to be taken with food). Ginger states she will have the nursing staff at [facility] hold her morning medications. NN also explained that it is her radiation that starts on 11/24, and not on 11/21. Tabitha verbalized understanding. No questions at this time.

## 2024-09-25 ENCOUNTER — Ambulatory Visit: Payer: Medicare (Managed Care) | Admitting: Radiation Oncology

## 2024-09-26 ENCOUNTER — Ambulatory Visit: Payer: Medicare (Managed Care)

## 2024-09-27 ENCOUNTER — Ambulatory Visit: Payer: Medicare (Managed Care)

## 2024-09-28 ENCOUNTER — Encounter (HOSPITAL_COMMUNITY)
Admission: RE | Admit: 2024-09-28 | Discharge: 2024-09-28 | Disposition: A | Discharge status: Home / Self Care | Payer: Medicare (Managed Care) | Source: Ambulatory Visit | Attending: Radiation Oncology | Admitting: Radiation Oncology

## 2024-09-28 ENCOUNTER — Ambulatory Visit: Payer: Medicare (Managed Care)

## 2024-09-28 DIAGNOSIS — Z510 Encounter for antineoplastic radiation therapy: Secondary | ICD-10-CM | POA: Diagnosis not present

## 2024-09-28 DIAGNOSIS — C3412 Malignant neoplasm of upper lobe, left bronchus or lung: Secondary | ICD-10-CM | POA: Diagnosis present

## 2024-09-28 LAB — GLUCOSE, CAPILLARY: Glucose-Capillary: 85 mg/dL (ref 70–99)

## 2024-09-28 MED ORDER — FLUDEOXYGLUCOSE F - 18 (FDG) INJECTION
10.1800 | Freq: Once | INTRAVENOUS | Status: AC
  Administered 2024-09-28: 10.18 via INTRAVENOUS

## 2024-10-01 ENCOUNTER — Other Ambulatory Visit: Payer: Self-pay

## 2024-10-01 ENCOUNTER — Ambulatory Visit
Admission: RE | Admit: 2024-10-01 | Discharge: 2024-10-01 | Disposition: A | Discharge status: Home / Self Care | Payer: Medicare (Managed Care) | Source: Ambulatory Visit | Attending: Radiation Oncology | Admitting: Radiation Oncology

## 2024-10-01 DIAGNOSIS — Z510 Encounter for antineoplastic radiation therapy: Secondary | ICD-10-CM | POA: Diagnosis not present

## 2024-10-01 LAB — RAD ONC ARIA SESSION SUMMARY
Course Elapsed Days: 0
Plan Fractions Treated to Date: 1
Plan Prescribed Dose Per Fraction: 7.5 Gy
Plan Total Fractions Prescribed: 8
Plan Total Prescribed Dose: 60 Gy
Reference Point Dosage Given to Date: 7.5 Gy
Reference Point Session Dosage Given: 7.5 Gy
Session Number: 1

## 2024-10-01 LAB — ACID FAST CULTURE WITH REFLEXED SENSITIVITIES (MYCOBACTERIA): Acid Fast Culture: NEGATIVE

## 2024-10-02 ENCOUNTER — Ambulatory Visit
Admission: RE | Admit: 2024-10-02 | Discharge: 2024-10-02 | Disposition: A | Discharge status: Home / Self Care | Payer: Medicare (Managed Care) | Source: Ambulatory Visit | Attending: Radiation Oncology | Admitting: Radiation Oncology

## 2024-10-02 ENCOUNTER — Other Ambulatory Visit: Payer: Self-pay

## 2024-10-02 DIAGNOSIS — Z510 Encounter for antineoplastic radiation therapy: Secondary | ICD-10-CM | POA: Diagnosis not present

## 2024-10-02 LAB — RAD ONC ARIA SESSION SUMMARY
Course Elapsed Days: 1
Plan Fractions Treated to Date: 2
Plan Prescribed Dose Per Fraction: 7.5 Gy
Plan Total Fractions Prescribed: 8
Plan Total Prescribed Dose: 60 Gy
Reference Point Dosage Given to Date: 15 Gy
Reference Point Session Dosage Given: 7.5 Gy
Session Number: 2

## 2024-10-03 ENCOUNTER — Telehealth: Payer: Self-pay | Admitting: Radiation Oncology

## 2024-10-03 ENCOUNTER — Ambulatory Visit: Payer: Medicare (Managed Care)

## 2024-10-03 NOTE — Telephone Encounter (Signed)
 11/26 Received call from Jon at [facility] to cancel patient's treatment appt on today due to lift is broken.  Patient will be here as sch on 12/1 -per Jon.

## 2024-10-08 ENCOUNTER — Ambulatory Visit: Payer: Medicare (Managed Care)

## 2024-10-08 ENCOUNTER — Other Ambulatory Visit: Payer: Self-pay

## 2024-10-08 ENCOUNTER — Ambulatory Visit
Admission: RE | Admit: 2024-10-08 | Discharge: 2024-10-08 | Disposition: A | Payer: Medicare (Managed Care) | Source: Ambulatory Visit | Attending: Radiation Oncology

## 2024-10-08 DIAGNOSIS — C3412 Malignant neoplasm of upper lobe, left bronchus or lung: Secondary | ICD-10-CM | POA: Insufficient documentation

## 2024-10-08 DIAGNOSIS — Z510 Encounter for antineoplastic radiation therapy: Secondary | ICD-10-CM | POA: Insufficient documentation

## 2024-10-08 LAB — RAD ONC ARIA SESSION SUMMARY
Course Elapsed Days: 7
Plan Fractions Treated to Date: 3
Plan Prescribed Dose Per Fraction: 7.5 Gy
Plan Total Fractions Prescribed: 8
Plan Total Prescribed Dose: 60 Gy
Reference Point Dosage Given to Date: 22.5 Gy
Reference Point Session Dosage Given: 7.5 Gy
Session Number: 3

## 2024-10-09 ENCOUNTER — Ambulatory Visit
Admission: RE | Admit: 2024-10-09 | Discharge: 2024-10-09 | Disposition: A | Payer: Medicare (Managed Care) | Source: Ambulatory Visit | Attending: Radiation Oncology

## 2024-10-09 ENCOUNTER — Other Ambulatory Visit: Payer: Self-pay

## 2024-10-09 DIAGNOSIS — Z510 Encounter for antineoplastic radiation therapy: Secondary | ICD-10-CM | POA: Diagnosis not present

## 2024-10-09 LAB — RAD ONC ARIA SESSION SUMMARY
Course Elapsed Days: 8
Plan Fractions Treated to Date: 4
Plan Prescribed Dose Per Fraction: 7.5 Gy
Plan Total Fractions Prescribed: 8
Plan Total Prescribed Dose: 60 Gy
Reference Point Dosage Given to Date: 30 Gy
Reference Point Session Dosage Given: 7.5 Gy
Session Number: 4

## 2024-10-10 ENCOUNTER — Other Ambulatory Visit: Payer: Self-pay

## 2024-10-10 ENCOUNTER — Ambulatory Visit
Admission: RE | Admit: 2024-10-10 | Discharge: 2024-10-10 | Payer: Medicare (Managed Care) | Attending: Radiation Oncology

## 2024-10-10 ENCOUNTER — Ambulatory Visit
Admission: RE | Admit: 2024-10-10 | Discharge: 2024-10-10 | Disposition: A | Payer: Medicare (Managed Care) | Source: Ambulatory Visit | Attending: Radiation Oncology

## 2024-10-10 DIAGNOSIS — Z510 Encounter for antineoplastic radiation therapy: Secondary | ICD-10-CM | POA: Diagnosis not present

## 2024-10-10 LAB — RAD ONC ARIA SESSION SUMMARY
Course Elapsed Days: 9
Plan Fractions Treated to Date: 5
Plan Prescribed Dose Per Fraction: 7.5 Gy
Plan Total Fractions Prescribed: 8
Plan Total Prescribed Dose: 60 Gy
Reference Point Dosage Given to Date: 37.5 Gy
Reference Point Session Dosage Given: 7.5 Gy
Session Number: 5

## 2024-10-11 ENCOUNTER — Ambulatory Visit
Admission: RE | Admit: 2024-10-11 | Discharge: 2024-10-11 | Disposition: A | Payer: Medicare (Managed Care) | Source: Ambulatory Visit | Attending: Radiation Oncology

## 2024-10-11 ENCOUNTER — Other Ambulatory Visit: Payer: Self-pay

## 2024-10-11 DIAGNOSIS — Z510 Encounter for antineoplastic radiation therapy: Secondary | ICD-10-CM | POA: Diagnosis not present

## 2024-10-11 LAB — RAD ONC ARIA SESSION SUMMARY
Course Elapsed Days: 10
Plan Fractions Treated to Date: 6
Plan Prescribed Dose Per Fraction: 7.5 Gy
Plan Total Fractions Prescribed: 8
Plan Total Prescribed Dose: 60 Gy
Reference Point Dosage Given to Date: 45 Gy
Reference Point Session Dosage Given: 7.5 Gy
Session Number: 6

## 2024-10-12 ENCOUNTER — Other Ambulatory Visit: Payer: Self-pay

## 2024-10-12 ENCOUNTER — Ambulatory Visit
Admission: RE | Admit: 2024-10-12 | Discharge: 2024-10-12 | Disposition: A | Payer: Medicare (Managed Care) | Source: Ambulatory Visit | Attending: Radiation Oncology

## 2024-10-12 DIAGNOSIS — Z510 Encounter for antineoplastic radiation therapy: Secondary | ICD-10-CM | POA: Diagnosis not present

## 2024-10-12 LAB — RAD ONC ARIA SESSION SUMMARY
Course Elapsed Days: 11
Plan Fractions Treated to Date: 7
Plan Prescribed Dose Per Fraction: 7.5 Gy
Plan Total Fractions Prescribed: 8
Plan Total Prescribed Dose: 60 Gy
Reference Point Dosage Given to Date: 52.5 Gy
Reference Point Session Dosage Given: 7.5 Gy
Session Number: 7

## 2024-10-15 ENCOUNTER — Ambulatory Visit
Admission: RE | Admit: 2024-10-15 | Discharge: 2024-10-15 | Disposition: A | Payer: Medicare (Managed Care) | Source: Ambulatory Visit | Attending: Radiation Oncology

## 2024-10-15 ENCOUNTER — Ambulatory Visit: Payer: Medicare (Managed Care)

## 2024-10-15 ENCOUNTER — Other Ambulatory Visit: Payer: Self-pay

## 2024-10-15 DIAGNOSIS — Z510 Encounter for antineoplastic radiation therapy: Secondary | ICD-10-CM | POA: Diagnosis not present

## 2024-10-15 LAB — RAD ONC ARIA SESSION SUMMARY
Course Elapsed Days: 14
Plan Fractions Treated to Date: 8
Plan Prescribed Dose Per Fraction: 7.5 Gy
Plan Total Fractions Prescribed: 8
Plan Total Prescribed Dose: 60 Gy
Reference Point Dosage Given to Date: 60 Gy
Reference Point Session Dosage Given: 7.5 Gy
Session Number: 8

## 2024-10-16 ENCOUNTER — Ambulatory Visit: Payer: Medicare (Managed Care) | Admitting: Radiation Oncology

## 2024-10-16 NOTE — Radiation Completion Notes (Signed)
 Patient Name: Jamie Haas, Jamie Haas MRN: [id number] Date of Birth: Apr 12, 1946 Referring Physician: PAULA SOUTHERLY, M.D. Date of Service: 2024-10-16 Radiation Oncologist: Estefana Cha, M.D. 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) T=cT1c, N=cN0, M=cM0 Intent: Curative     ==========DELIVERED PLANS==========  First Treatment Date: 2024-10-01 Last Treatment Date: 2024-10-15   Plan Name: Lung_L Site: Lung, Left Technique: IMRT Mode: Photon Dose Per Fraction: 7.5 Gy Prescribed Dose (Delivered / Prescribed): 60 Gy / 60 Gy Prescribed Fxs (Delivered / Prescribed): 8 / 8     ==========ON TREATMENT VISIT DATES========== 2024-10-10, 2024-10-15     ==========UPCOMING VISITS========== 11/15/2024 [facility]-RADIATION ONC FOLLOW UP 20 Cha Estefana, MD        ==========APPENDIX - ON TREATMENT VISIT NOTES==========   See weekly On Treatment Notes in Epic for details in the Media tab (listed as Progress notes on the On Treatment Visit Dates listed above).

## 2024-11-10 NOTE — Progress Notes (Incomplete)
 "  Radiation Oncology         [phone] ________________________________  Name: Jamie Haas MRN: [id number]  Date: 11/15/2024  DOB: 1946/09/02  Follow-Up Visit Note - Conducted via telephone for patient preference.  I spoke with the patient to conduct this consult visit via telephone. The patient was notified in advance and was offered an in person or telemedicine meeting and opted to proceed with a telephone consult.   CC: System, Provider Not In  No ref. provider found  Diagnosis:      ICD-10-CM   1. 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 She tolerated treatment well with some increased fatigue that is slowly starting to improve. Denies chest pain, hemoptysis, new SOB and new cough.                              ALLERGIES:  has no known allergies.  Meds: Current Outpatient Medications  Medication Sig Dispense Refill   albuterol  (VENTOLIN  HFA) 108 (90 Base) MCG/ACT inhaler Inhale 2 puffs into the lungs every 6 (six) hours as needed for wheezing or shortness of breath.     aspirin  EC 81 MG tablet Take 81 mg by mouth daily.     atorvastatin  (LIPITOR) 10 MG tablet Take 10 mg by mouth daily.     cholecalciferol (VITAMIN D) 1000 units tablet Take 1,000 Units by mouth daily.     diclofenac  sodium (VOLTAREN ) 1 % GEL Apply 2 g topically 4 (four) times daily. 100 g 0   Emollient (EUCERIN DAILY HYDRATION) CREA Apply 1 application  topically in the morning and at bedtime.     ferrous sulfate  324 MG TBEC Take 324 mg by mouth daily.     gabapentin  (NEURONTIN ) 100 MG capsule Take 100 mg by mouth daily.     melatonin 3 MG TABS tablet Take 6 mg by mouth at bedtime.     methimazole  (TAPAZOLE ) 5 MG tablet Take 5 mg by mouth daily.     mirtazapine  (REMERON ) 7.5 MG tablet Take 7.5 mg by mouth at bedtime.     sertraline  (ZOLOFT ) 50  MG tablet Take 50 mg by mouth daily.     Tiotropium Bromide  Monohydrate (SPIRIVA  RESPIMAT) 2.5 MCG/ACT AERS Inhale 2 puffs into the lungs daily.     valACYclovir  (VALTREX ) 500 MG tablet Take 500 mg by mouth daily.     venlafaxine  XR (EFFEXOR -XR) 75 MG 24 hr capsule Take 75 mg by mouth daily.     Zinc Oxide 10 % OINT Apply 1 application  topically in the morning and at bedtime. Apply to buttocks and groin every day and night shift. May also be applied as needed for skin irritation.     No current facility-administered medications for this visit.    Physical Findings: The patient is in no acute distress. Patient is alert and oriented.  vitals were not taken for this visit. .  No significant changes.  Lab Findings: Lab Results  Component Value Date   WBC 9.1 08/17/2024   HGB 12.1 08/17/2024   HCT 38.6 08/17/2024   MCV 96.3 08/17/2024   PLT 310 08/17/2024    Radiographic Findings: No results found.  Impression:  The patient is recovering from the effects of radiation.  ***  Plan:  Fu in 2 months with CT Chest WO same day  ---  This encounter was conducted via telephone. The patient has provided two factor identification and has given verbal consent for this type of encounter and has been advised to only accept a meeting of this type in a secure network environment.  The attendants for this meeting include Eudora Sharps, Estefana Cha  and Othel LITTIE Sar. During the encounter,  Eudora Sharps and I were located at [facility] Radiation Oncology Department. The patient was located at home.   Total time spent today in preparation for this visit was *** minutes. This included patient care, imaging and path review, documentation, multidisciplinary discussion and coordination of care and follow up.    Estefana HERO. Cha, M.D.       "

## 2024-11-13 NOTE — Progress Notes (Signed)
 Jamie Haas is here today for follow up post radiation to the lung via telephone..  Lung Side:  Lung_L . She completed radiation on 10-15-2024  Does the patient complain of any of the following: Pain: Denies Shortness of breath w/wo exertion: Denies Cough: Denies Hemoptysis: Denies Pain with swallowing: Denies Swallowing/choking concerns: Denies Appetite: Good Energy Level: Good Post radiation skin Changes:     Additional comments if applicable: No vitals were taken for this visit

## 2024-11-15 ENCOUNTER — Encounter: Payer: Self-pay | Admitting: Radiation Oncology

## 2024-11-15 ENCOUNTER — Ambulatory Visit
Admission: RE | Admit: 2024-11-15 | Discharge: 2024-11-15 | Disposition: A | Payer: Medicare (Managed Care) | Source: Ambulatory Visit | Attending: Radiation Oncology | Admitting: Radiation Oncology

## 2024-11-15 DIAGNOSIS — C3412 Malignant neoplasm of upper lobe, left bronchus or lung: Secondary | ICD-10-CM

## 2025-01-22 ENCOUNTER — Ambulatory Visit: Payer: Medicare (Managed Care) | Admitting: Endocrinology
